# Patient Record
Sex: Female | Born: 1945 | Hispanic: No | Smoking: Former smoker
Health system: Southern US, Community
[De-identification: ages and names within clinical notes are randomized; demographics above are authoritative.]

## PROBLEM LIST (undated history)

## (undated) DIAGNOSIS — M5412 Radiculopathy, cervical region: Secondary | ICD-10-CM

## (undated) DIAGNOSIS — E785 Hyperlipidemia, unspecified: Secondary | ICD-10-CM

## (undated) DIAGNOSIS — F32A Depression, unspecified: Secondary | ICD-10-CM

## (undated) DIAGNOSIS — K259 Gastric ulcer, unspecified as acute or chronic, without hemorrhage or perforation: Secondary | ICD-10-CM

## (undated) DIAGNOSIS — F419 Anxiety disorder, unspecified: Secondary | ICD-10-CM

## (undated) DIAGNOSIS — I10 Essential (primary) hypertension: Secondary | ICD-10-CM

## (undated) DIAGNOSIS — Z8639 Personal history of other endocrine, nutritional and metabolic disease: Secondary | ICD-10-CM

## (undated) DIAGNOSIS — T8543XA Leakage of breast prosthesis and implant, initial encounter: Secondary | ICD-10-CM

## (undated) DIAGNOSIS — D509 Iron deficiency anemia, unspecified: Secondary | ICD-10-CM

## (undated) DIAGNOSIS — K635 Polyp of colon: Secondary | ICD-10-CM

## (undated) DIAGNOSIS — E876 Hypokalemia: Secondary | ICD-10-CM

## (undated) DIAGNOSIS — Z9889 Other specified postprocedural states: Secondary | ICD-10-CM

## (undated) DIAGNOSIS — M069 Rheumatoid arthritis, unspecified: Secondary | ICD-10-CM

## (undated) DIAGNOSIS — K219 Gastro-esophageal reflux disease without esophagitis: Secondary | ICD-10-CM

## (undated) DIAGNOSIS — R7303 Prediabetes: Secondary | ICD-10-CM

## (undated) DIAGNOSIS — M1990 Unspecified osteoarthritis, unspecified site: Secondary | ICD-10-CM

## (undated) DIAGNOSIS — G4700 Insomnia, unspecified: Secondary | ICD-10-CM

## (undated) DIAGNOSIS — T7840XA Allergy, unspecified, initial encounter: Secondary | ICD-10-CM

## (undated) DIAGNOSIS — M797 Fibromyalgia: Secondary | ICD-10-CM

## (undated) DIAGNOSIS — B019 Varicella without complication: Secondary | ICD-10-CM

## (undated) DIAGNOSIS — F329 Major depressive disorder, single episode, unspecified: Secondary | ICD-10-CM

## (undated) HISTORY — DX: Varicella without complication: B01.9

## (undated) HISTORY — DX: Hyperlipidemia, unspecified: E78.5

## (undated) HISTORY — PX: MASTECTOMY: SHX3

## (undated) HISTORY — DX: Depression, unspecified: F32.A

## (undated) HISTORY — PX: SMALL INTESTINE SURGERY: SHX150

## (undated) HISTORY — DX: Allergy, unspecified, initial encounter: T78.40XA

## (undated) HISTORY — DX: Hypokalemia: E87.6

## (undated) HISTORY — DX: Major depressive disorder, single episode, unspecified: F32.9

## (undated) HISTORY — DX: Unspecified osteoarthritis, unspecified site: M19.90

## (undated) HISTORY — DX: Anxiety disorder, unspecified: F41.9

## (undated) HISTORY — PX: FRACTURE SURGERY: SHX138

## (undated) HISTORY — DX: Gastric ulcer, unspecified as acute or chronic, without hemorrhage or perforation: K25.9

## (undated) HISTORY — PX: EYE SURGERY: SHX253

## (undated) HISTORY — DX: Polyp of colon: K63.5

## (undated) HISTORY — PX: AUGMENTATION MAMMAPLASTY: SUR837

## (undated) HISTORY — DX: Rheumatoid arthritis, unspecified: M06.9

## (undated) HISTORY — PX: UPPER GASTROINTESTINAL ENDOSCOPY: SHX188

## (undated) HISTORY — DX: Personal history of other endocrine, nutritional and metabolic disease: Z86.39

## (undated) HISTORY — DX: Fibromyalgia: M79.7

## (undated) MED FILL — Iron Sucrose Inj 20 MG/ML (Fe Equiv): INTRAVENOUS | Qty: 10 | Status: AC

---
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
## 1972-04-20 HISTORY — PX: ABDOMINAL HYSTERECTOMY: SHX81

## 2001-12-19 HISTORY — PX: SMALL INTESTINE SURGERY: SHX150

## 2001-12-19 HISTORY — PX: STOMACH SURGERY: SHX791

## 2010-11-22 HISTORY — PX: FOOT SURGERY: SHX648

## 2015-01-30 DIAGNOSIS — E039 Hypothyroidism, unspecified: Secondary | ICD-10-CM | POA: Diagnosis not present

## 2015-01-30 DIAGNOSIS — Z1239 Encounter for other screening for malignant neoplasm of breast: Secondary | ICD-10-CM | POA: Diagnosis not present

## 2015-01-30 DIAGNOSIS — M069 Rheumatoid arthritis, unspecified: Secondary | ICD-10-CM | POA: Diagnosis not present

## 2015-01-30 DIAGNOSIS — Z0000 Encounter for general adult medical examination without abnormal findings: Secondary | ICD-10-CM | POA: Diagnosis not present

## 2015-01-30 DIAGNOSIS — Z7189 Other specified counseling: Secondary | ICD-10-CM | POA: Diagnosis not present

## 2015-01-30 DIAGNOSIS — E785 Hyperlipidemia, unspecified: Secondary | ICD-10-CM | POA: Diagnosis not present

## 2015-01-30 DIAGNOSIS — Z23 Encounter for immunization: Secondary | ICD-10-CM | POA: Diagnosis not present

## 2015-01-30 DIAGNOSIS — Z681 Body mass index (BMI) 19 or less, adult: Secondary | ICD-10-CM | POA: Diagnosis not present

## 2015-02-01 DIAGNOSIS — H43399 Other vitreous opacities, unspecified eye: Secondary | ICD-10-CM | POA: Diagnosis not present

## 2015-02-01 DIAGNOSIS — H43819 Vitreous degeneration, unspecified eye: Secondary | ICD-10-CM | POA: Diagnosis not present

## 2015-02-01 DIAGNOSIS — H524 Presbyopia: Secondary | ICD-10-CM | POA: Diagnosis not present

## 2015-02-01 DIAGNOSIS — H5201 Hypermetropia, right eye: Secondary | ICD-10-CM | POA: Diagnosis not present

## 2015-02-01 DIAGNOSIS — H52223 Regular astigmatism, bilateral: Secondary | ICD-10-CM | POA: Diagnosis not present

## 2015-02-07 DIAGNOSIS — D649 Anemia, unspecified: Secondary | ICD-10-CM | POA: Diagnosis not present

## 2015-02-11 DIAGNOSIS — D649 Anemia, unspecified: Secondary | ICD-10-CM | POA: Diagnosis not present

## 2015-03-26 DIAGNOSIS — M069 Rheumatoid arthritis, unspecified: Secondary | ICD-10-CM | POA: Diagnosis not present

## 2015-03-26 DIAGNOSIS — M25512 Pain in left shoulder: Secondary | ICD-10-CM | POA: Diagnosis not present

## 2015-03-26 DIAGNOSIS — G8929 Other chronic pain: Secondary | ICD-10-CM | POA: Diagnosis not present

## 2015-03-26 DIAGNOSIS — D649 Anemia, unspecified: Secondary | ICD-10-CM | POA: Diagnosis not present

## 2015-04-02 DIAGNOSIS — M1990 Unspecified osteoarthritis, unspecified site: Secondary | ICD-10-CM | POA: Diagnosis not present

## 2015-04-02 DIAGNOSIS — E039 Hypothyroidism, unspecified: Secondary | ICD-10-CM | POA: Diagnosis not present

## 2015-04-02 DIAGNOSIS — R634 Abnormal weight loss: Secondary | ICD-10-CM | POA: Diagnosis not present

## 2015-04-02 DIAGNOSIS — M069 Rheumatoid arthritis, unspecified: Secondary | ICD-10-CM | POA: Diagnosis not present

## 2015-04-02 DIAGNOSIS — F419 Anxiety disorder, unspecified: Secondary | ICD-10-CM | POA: Diagnosis not present

## 2015-04-02 DIAGNOSIS — E785 Hyperlipidemia, unspecified: Secondary | ICD-10-CM | POA: Diagnosis not present

## 2015-04-02 DIAGNOSIS — R252 Cramp and spasm: Secondary | ICD-10-CM | POA: Diagnosis not present

## 2015-05-14 DIAGNOSIS — Z1211 Encounter for screening for malignant neoplasm of colon: Secondary | ICD-10-CM | POA: Diagnosis not present

## 2015-05-14 DIAGNOSIS — F419 Anxiety disorder, unspecified: Secondary | ICD-10-CM | POA: Diagnosis not present

## 2015-05-14 DIAGNOSIS — Z8639 Personal history of other endocrine, nutritional and metabolic disease: Secondary | ICD-10-CM | POA: Diagnosis not present

## 2015-05-14 DIAGNOSIS — R634 Abnormal weight loss: Secondary | ICD-10-CM | POA: Diagnosis not present

## 2015-05-14 LAB — LIPID PANEL
Cholesterol: 165 mg/dL (ref 0–200)
HDL: 68 mg/dL (ref 35–70)
LDL Cholesterol: 92 mg/dL
Triglycerides: 162 mg/dL — AB (ref 40–160)

## 2015-05-14 LAB — CBC AND DIFFERENTIAL
HCT: 36 % (ref 36–46)
HEMOGLOBIN: 11.3 g/dL — AB (ref 12.0–16.0)
Platelets: 340 10*3/uL (ref 150–399)
WBC: 9.7 10^3/mL

## 2015-05-14 LAB — BASIC METABOLIC PANEL
BUN: 16 mg/dL (ref 4–21)
CREATININE: 0.4 mg/dL — AB (ref 0.5–1.1)
GLUCOSE: 72 mg/dL
POTASSIUM: 4.1 mmol/L (ref 3.4–5.3)
SODIUM: 137 mmol/L (ref 137–147)

## 2015-05-14 LAB — TSH: TSH: 2.52 u[IU]/mL (ref 0.41–5.90)

## 2015-05-24 DIAGNOSIS — D500 Iron deficiency anemia secondary to blood loss (chronic): Secondary | ICD-10-CM | POA: Diagnosis not present

## 2015-05-24 DIAGNOSIS — R197 Diarrhea, unspecified: Secondary | ICD-10-CM | POA: Diagnosis not present

## 2015-06-12 DIAGNOSIS — F419 Anxiety disorder, unspecified: Secondary | ICD-10-CM | POA: Diagnosis not present

## 2015-06-12 DIAGNOSIS — E785 Hyperlipidemia, unspecified: Secondary | ICD-10-CM | POA: Insufficient documentation

## 2015-06-12 DIAGNOSIS — R634 Abnormal weight loss: Secondary | ICD-10-CM | POA: Diagnosis not present

## 2015-06-12 DIAGNOSIS — E876 Hypokalemia: Secondary | ICD-10-CM | POA: Diagnosis not present

## 2015-06-12 DIAGNOSIS — M797 Fibromyalgia: Secondary | ICD-10-CM | POA: Insufficient documentation

## 2015-06-20 ENCOUNTER — Ambulatory Visit: Payer: Self-pay | Admitting: Family Medicine

## 2015-06-20 DIAGNOSIS — K2900 Acute gastritis without bleeding: Secondary | ICD-10-CM | POA: Diagnosis not present

## 2015-06-20 DIAGNOSIS — Z87891 Personal history of nicotine dependence: Secondary | ICD-10-CM | POA: Diagnosis not present

## 2015-06-20 DIAGNOSIS — R197 Diarrhea, unspecified: Secondary | ICD-10-CM | POA: Diagnosis not present

## 2015-06-20 DIAGNOSIS — D125 Benign neoplasm of sigmoid colon: Secondary | ICD-10-CM | POA: Diagnosis not present

## 2015-06-20 DIAGNOSIS — K6289 Other specified diseases of anus and rectum: Secondary | ICD-10-CM | POA: Diagnosis not present

## 2015-06-20 DIAGNOSIS — D649 Anemia, unspecified: Secondary | ICD-10-CM | POA: Diagnosis not present

## 2015-06-20 DIAGNOSIS — R150 Incomplete defecation: Secondary | ICD-10-CM | POA: Diagnosis not present

## 2015-06-20 DIAGNOSIS — K5730 Diverticulosis of large intestine without perforation or abscess without bleeding: Secondary | ICD-10-CM | POA: Diagnosis not present

## 2015-06-20 DIAGNOSIS — D500 Iron deficiency anemia secondary to blood loss (chronic): Secondary | ICD-10-CM | POA: Diagnosis not present

## 2015-06-20 LAB — HM COLONOSCOPY

## 2015-07-01 ENCOUNTER — Encounter: Payer: Self-pay | Admitting: Family Medicine

## 2015-07-01 ENCOUNTER — Ambulatory Visit (INDEPENDENT_AMBULATORY_CARE_PROVIDER_SITE_OTHER): Payer: Medicare Other | Admitting: Family Medicine

## 2015-07-01 VITALS — BP 112/70 | HR 68 | Temp 98.0°F | Ht 63.75 in | Wt 129.1 lb

## 2015-07-01 DIAGNOSIS — E2839 Other primary ovarian failure: Secondary | ICD-10-CM

## 2015-07-01 DIAGNOSIS — F419 Anxiety disorder, unspecified: Secondary | ICD-10-CM

## 2015-07-01 DIAGNOSIS — M898X1 Other specified disorders of bone, shoulder: Secondary | ICD-10-CM | POA: Insufficient documentation

## 2015-07-01 DIAGNOSIS — Z8639 Personal history of other endocrine, nutritional and metabolic disease: Secondary | ICD-10-CM | POA: Insufficient documentation

## 2015-07-01 DIAGNOSIS — F418 Other specified anxiety disorders: Secondary | ICD-10-CM | POA: Diagnosis not present

## 2015-07-01 DIAGNOSIS — E785 Hyperlipidemia, unspecified: Secondary | ICD-10-CM

## 2015-07-01 DIAGNOSIS — M069 Rheumatoid arthritis, unspecified: Secondary | ICD-10-CM | POA: Diagnosis not present

## 2015-07-01 DIAGNOSIS — D509 Iron deficiency anemia, unspecified: Secondary | ICD-10-CM | POA: Insufficient documentation

## 2015-07-01 DIAGNOSIS — F32A Depression, unspecified: Secondary | ICD-10-CM | POA: Insufficient documentation

## 2015-07-01 DIAGNOSIS — G4700 Insomnia, unspecified: Secondary | ICD-10-CM | POA: Diagnosis not present

## 2015-07-01 DIAGNOSIS — F329 Major depressive disorder, single episode, unspecified: Secondary | ICD-10-CM | POA: Insufficient documentation

## 2015-07-01 MED ORDER — TRAMADOL HCL 50 MG PO TABS: 50.0000 mg | ORAL_TABLET | Freq: Three times a day (TID) | ORAL | Status: DC | PRN

## 2015-07-01 NOTE — Assessment & Plan Note (Signed)
Discussed referral to rheumatology and patient and daughter would like to hold off at this time. Advised to discontinue meloxicam given her history of gastric ulcerations. Starting on tramadol.

## 2015-07-01 NOTE — Assessment & Plan Note (Signed)
Well-controlled. ASCVD risk score of 5.9%. No pharmacotherapy currently.

## 2015-07-01 NOTE — Patient Instructions (Signed)
It was nice to see you today.  Stop the meloxicam. Use the tramadol as needed.  We will call with your bone density and PT appts.  Follow up:  3 months.  Take care  Dr. Lacinda Axon

## 2015-07-01 NOTE — Progress Notes (Signed)
Subjective:  Patient ID: Audrey Hamilton, female    DOB: 02/18/1946  Age: 70 y.o. MRN: [id number]  CC: Establish care  HPI Audrey Hamilton is a 70 y.o. female presents to the clinic today to establish care. Issues are outline below.  Rheumatoid arthritis/osteoarthritis  Patient is currently taking meloxicam daily for pain.  She is not on any other rheumatologic drugs.  She reports she has significant pain particularly of her right wrist.  Pain is exacerbated by range of motion/activity.  Anxiety/depression  Patient has a long-standing history of anxiety depression which developed after a grieving process asked the death of her husband.  She states that she is currently feeling well.  She has had improvement in her mood since moving to the area.  She is currently taking Lexapro and Ativan (PRN).  Scapula pain (left)  Long-standing.  Started after an injury while lifting her husband while he was sick.  She reports neck and pain of her left scapula towards the midline.  She has been using Lidoderm patches with some relief.  She's had no sensory response to NSAIDs.  Exacerbated by activity/range motion.  HLD  Well controlled.   Not treated currently.  Anemia  Patient has a recent history of microcytic anemia with a hemoglobin of 11.3  She has had a recent EGD as well as colonoscopy.  She is requesting the results of these. Results are not available to me at this time.  Stable at this time.  PMH, Surgical Hx, Family Hx, Social History reviewed and updated as below.  Past Medical History  Diagnosis Date  . Arthritis   . Chicken pox   . Depression   . Allergy   . Multiple gastric ulcers   . Hyperlipidemia   . Colon polyps   . Hypokalemia   . Fibromyalgia   . Rheumatoid arthritis ([place])   . History of hypothyroidism   . Anxiety    Past Surgical History  Procedure Laterality Date  . Tonsillectomy and adenoidectomy  1966  . Abdominal  hysterectomy  1974  . Foot surgery Left 11/22/10  . Stomach surgery  12/2001  . Small intestine surgery  12/2001  . Eye surgery    . Upper gastrointestinal endoscopy     Family History  Problem Relation Age of Onset  . Arthritis Mother   . Sudden death Mother     after birth of pt who was 63 days old.  . Arthritis Maternal Grandmother   . Hyperlipidemia Maternal Grandfather   . Diabetes Mellitus II Father   . Liver disease Father   . Cancer Maternal Grandmother   . Depression Mother    Social History  Substance Use Topics  . Smoking status: Former Smoker    Quit date: 05/20/1983  . Smokeless tobacco: Never Used  . Alcohol Use: No   Review of Systems  Musculoskeletal: Positive for myalgias and joint swelling.  Neurological:       Memory problems.   All other systems reviewed and are negative.  Objective:   Today's Vitals: BP 112/70 mmHg  Pulse 68  Temp(Src) 98 F (36.7 C) (Oral)  Ht 5' 3.75" (1.619 m)  Wt 129 lb 2 oz (58.571 kg)  BMI 22.35 kg/m2  SpO2 85%  Physical Exam  Constitutional: She is oriented to person, place, and time.  Frail appearing elderly female in no acute distress.  HENT:  Head: Normocephalic and atraumatic.  Mouth/Throat: Oropharynx is clear and moist.  Eyes: Conjunctivae are normal. No scleral icterus.  Neck: Neck supple. No thyromegaly present.  Cardiovascular: Normal rate and regular rhythm.   2/6 systolic murmur best heard at the right second intercostal space.  Pulmonary/Chest: Effort normal.  Expiratory wheezing noted.  Abdominal: Soft. She exhibits no distension. There is no tenderness. There is no rebound and no guarding.  Musculoskeletal:  Decreased range of motion and tenderness to palpation of the right wrist.  Neurological: She is alert and oriented to person, place, and time.  Skin: Skin is warm and dry. No rash noted. No erythema.  Psychiatric: She has a normal mood and affect.  Vitals reviewed.  Assessment & Plan:   Problem  List Items Addressed This Visit    Anxiety and depression    Well-controlled. Continue Lexapro and Ativan. I did have a discussion today with the patient as well as her daughter about wanting to taper/discontinue the Ativan given her age.      Insomnia   Rheumatoid arthritis ([place])    Discussed referral to rheumatology and patient and daughter would like to hold off at this time. Advised to discontinue meloxicam given her history of gastric ulcerations. Starting on tramadol.      Relevant Medications   traMADol (ULTRAM) 50 MG tablet   HLD (hyperlipidemia)    Well-controlled. ASCVD risk score of 5.9%. No pharmacotherapy currently.      Microcytic anemia    Unclear etiology but I suspect that this is from peptic ulcer disease. Awaiting results from EGD/colonoscopy.      Pain of left scapula - Primary    Long-standing/chronic problem. This is musculoskeletal in origin. Referring to PT.      Relevant Orders   Ambulatory referral to Physical Therapy    Other Visit Diagnoses    Estrogen deficiency        Relevant Orders    DG Bone Density      Outpatient Encounter Prescriptions as of 07/01/2015  Medication Sig  . Bee Pollen 580 MG CAPS Take by mouth.  . escitalopram (LEXAPRO) 10 MG tablet Take by mouth.  . fluticasone (FLONASE) 50 MCG/ACT nasal spray Place into the nose at bedtime.   . lidocaine (LIDODERM) 5 % Apply 1 patch topically every other day.   Marland Kitchen LORazepam (ATIVAN) 1 MG tablet Take by mouth at bedtime.   . montelukast (SINGULAIR) 10 MG tablet Take by mouth at bedtime.   . sucralfate (CARAFATE) 1 g tablet TAKE 1 TABLET BY MOUTH THREE TIMES DAILY BEFORE MEALS.  . traMADol (ULTRAM) 50 MG tablet Take 1 tablet (50 mg total) by mouth every 8 (eight) hours as needed.  . [DISCONTINUED] meloxicam (MOBIC) 7.5 MG tablet Take 7.5 mg by mouth 2 (two) times daily.   No facility-administered encounter medications on file as of 07/01/2015.    Follow-up: Return in about 3  months (around 10/01/2015) for Follow up Chronic medical issues.  [place]

## 2015-07-01 NOTE — Assessment & Plan Note (Signed)
Long-standing/chronic problem. This is musculoskeletal in origin. Referring to PT.

## 2015-07-01 NOTE — Progress Notes (Signed)
Pre visit review using our clinic review tool, if applicable. No additional management support is needed unless otherwise documented below in the visit note. 

## 2015-07-01 NOTE — Assessment & Plan Note (Signed)
Unclear etiology but I suspect that this is from peptic ulcer disease. Awaiting results from EGD/colonoscopy.

## 2015-07-01 NOTE — Assessment & Plan Note (Addendum)
Well-controlled. Continue Lexapro and Ativan. I did have a discussion today with the patient as well as her daughter about wanting to taper/discontinue the Ativan given her age.

## 2015-07-12 ENCOUNTER — Other Ambulatory Visit: Payer: Self-pay | Admitting: Family Medicine

## 2015-07-12 ENCOUNTER — Telehealth: Payer: Self-pay | Admitting: Family Medicine

## 2015-07-12 DIAGNOSIS — M069 Rheumatoid arthritis, unspecified: Secondary | ICD-10-CM

## 2015-07-12 NOTE — Telephone Encounter (Signed)
Pt daughter called needing a referral to [facility] Rheumatology. She states that when she was in she was given the information to call to schedule but when she did they informed her that they need a referral from PCP. Please advise.

## 2015-07-12 NOTE — Telephone Encounter (Signed)
I placed referral.  Thanks  JC DO

## 2015-07-22 ENCOUNTER — Encounter: Payer: Self-pay | Admitting: Physical Therapy

## 2015-07-22 ENCOUNTER — Ambulatory Visit: Payer: Medicare Other | Attending: Family Medicine | Admitting: Physical Therapy

## 2015-07-22 DIAGNOSIS — R293 Abnormal posture: Secondary | ICD-10-CM | POA: Diagnosis not present

## 2015-07-22 DIAGNOSIS — M6281 Muscle weakness (generalized): Secondary | ICD-10-CM

## 2015-07-22 DIAGNOSIS — M898X1 Other specified disorders of bone, shoulder: Secondary | ICD-10-CM | POA: Diagnosis not present

## 2015-07-22 NOTE — Therapy (Signed)
[place] [facility]IN [facility] [address] [place], [place], [address] Phone: [phone]   Fax:  [phone]  Physical Therapy Evaluation  Patient Details  Name: Audrey Hamilton MRN: [id number] Date of Birth: [birth year] Referring Provider: Jene Every MD  Encounter Date: 07/22/2015      PT End of Session - 07/22/15 1024    Visit Number 1   Number of Visits 13   Date for PT Re-Evaluation 09/02/15   Authorization Type Gcode 1   Authorization Time Period 10   PT Start Time 0935   PT Stop Time 1015   PT Time Calculation (min) 40 min   Activity Tolerance Patient tolerated treatment well;No increased pain   Behavior During Therapy [facility] for tasks assessed/performed      Past Medical History  Diagnosis Date  . Chicken pox   . Depression   . Allergy   . Multiple gastric ulcers   . Hyperlipidemia   . Colon polyps   . Hypokalemia   . Fibromyalgia   . History of hypothyroidism   . Arthritis   . Rheumatoid arthritis ([place]) Since 1995    currently in a flare up  . Anxiety     controlled    Past Surgical History  Procedure Laterality Date  . Tonsillectomy and adenoidectomy  1966  . Abdominal hysterectomy  1974  . Foot surgery Left 11/22/10  . Stomach surgery  12/2001  . Small intestine surgery  12/2001  . Eye surgery    . Upper gastrointestinal endoscopy      There were no vitals filed for this visit.  Visit Diagnosis:  Pain in scapula - Plan: PT plan of care cert/re-cert  Muscle weakness (generalized) - Plan: PT plan of care cert/re-cert  Abnormal posture - Plan: PT plan of care cert/re-cert      Subjective Assessment - 07/22/15 0940    Subjective 71 yo Female reports increased left scapula pain over last year; She reports trying prednisone which did help a little bit; She reports when she finished the medication she reports that her pain returned. Patient reports trying a TENs unit with some good results; She reports that her pain  started when she would help try to lift her husband. She reports that her husband would fall a lot and she would have to help him; He passed away last year; She reports that her pain is aggravated with LUE movement/lifting;    Pertinent History Personal factors affecting rehab: RA, Fibromyalgia, Chronic pain,    How long can you sit comfortably? 15 min   How long can you stand comfortably? 30 min;    How long can you walk comfortably? does walk a lot, not using cane; not able to walk a mile, but has been walking better;    Diagnostic tests Had x-rays; negative for fracture;    Patient Stated Goals "Use my left arm without pain, be able to move around like I used to"   Currently in Pain? Yes   Pain Score 5    Pain Location Scapula   Pain Orientation Left   Pain Descriptors / Indicators Throbbing   Pain Type Chronic pain   Pain Onset More than a month ago   Pain Frequency Intermittent   Aggravating Factors  using LUE, lifting;    Pain Relieving Factors TENs unit (uses about every hour, 10-15 min)   Effect of Pain on Daily Activities decreased mobility;  [facility] PT Assessment - 07/22/15 0001    Assessment   Medical Diagnosis Left scapula pain   Referring Provider Jene Every MD   Onset Date/Surgical Date 04/20/14   Hand Dominance Right   Next MD Visit August 2017   Prior Therapy Denies any PT in the past;    Precautions   Precautions Fall   Restrictions   Weight Bearing Restrictions No   Balance Screen   Has the patient fallen in the past 6 months No   Has the patient had a decrease in activity level because of a fear of falling?  Yes   Is the patient reluctant to leave their home because of a fear of falling?  No   Home Environment   Additional Comments Lives in a single story home, no stairs to enter; Lives alone; Mod I with all ADLs; She does all the cooking and light housework;    Prior Function   Level of Independence Independent;Independent with gait;Independent  with transfers;Independent with homemaking with ambulation   Vocation Retired   Leisure likes to do crafts, knit; going to sign up for [employer];    Cognition   Overall Cognitive Status Within Functional Limits for tasks assessed   Observation/Other Assessments   Skin Integrity grossly intact   Sensation   Light Touch Appears Intact   Additional Comments no numbness/tingling throughout BUE   Coordination   Gross Motor Movements are Fluid and Coordinated Yes   Fine Motor Movements are Fluid and Coordinated Yes   Posture/Postural Control   Posture Comments Patients sits unsupported with moderate forward rounded shoulder, increased left scapular winging noted, increased thoracic kyphosis; Patient able to self correct partially without increase in pain;    AROM   Overall AROM Comments BUE AROM is WFL; however decreased left scapular abduction noted as compared to RUE; increased pain reported with shoulder abduction/flexion;   Strength   Overall Strength Comments BUE grossly 4-/5, with exception: LUE: middle trap, rhomboids, 3/5, shoulder abduction 3/5   Palpation   Spinal mobility hypmobility noted throughout thoracic spine, increased pain with PA mobs at T4, T5, T6, T7 with pain radiating to left scapula;    Palpation comment pain along T4, T5, T6, T7 with PA mobs; also has tightness throughout left rhomboids/middle trap;    Transfers   Comments able to transfer independently;    Ambulation/Gait   Gait Comments ambulates independently, slower gait speed, narrow base of support, good foot clearance;    High Level Balance   High Level Balance Comments static standing balance is good, dynamic standing balance is fair;       TREATMENT: Initiated HEP: Standing with red tband in BUE: Shoulder rows x10 reps with cues for increased scapular retraction; BUE shoulder extension with min VCs to avoid shoulder elevation; Posterior shoulder rolls x10 reps;                      PT Education - 07/22/15 1024    Education provided Yes   Education Details HEP initiated, findings   Person(s) Educated Patient   Methods Explanation;Demonstration;Verbal cues;Handout   Comprehension Verbalized understanding;Returned demonstration;Verbal cues required             PT Long Term Goals - 07/22/15 1029    PT LONG TERM GOAL #1   Title Patient will be independent in home exercise program to improve strength/mobility for better functional independence with ADLs. by 09/02/15   Time 6   Period Weeks  Status New   PT LONG TERM GOAL #2   Title  Patient will be able to perform household work/ chores without increase in symptoms. by 09/02/15   Time 6   Period Weeks   Status New   PT LONG TERM GOAL #3   Title Patient will increase BUE gross strength to at least 4/5 without pain during LUE MMT for increased tolerance with ADLs including light lifting by 09/02/15   Time 6   Period Weeks   Status New   PT LONG TERM GOAL #4   Title Patient will improve AROM UE so they are able to perform overhead ADL's such as reaching into cabinets without pain by 09/02/15   Time 6   Period Weeks   Status New               Plan - August 05, 2015 1025    Clinical Impression Statement 70 yo Female reports increased left scapula pain over last year. She reports having increased pain after trying to help her husband off the floor. He passed away last year but she had to provide a lot of physical support due to his imbalance. She exhibits decreased scapula-humeral rhthym with decreased scapular abduction during overhead reach; She also exhibits increased left scapular winging as compared to RUE; Patient exhibits increased weakness in BUE but particularly in left scapular paraspinals. She did exhibit increased pain along spinal mobs. Patient has a history of RA and fibromyalgia. She has not had a recent bone scan but is scheduled for one. Given her advanced age, spinal mobs will be withheld until bone  scan complete over concern for osteoporosis. Patient would benefit from skilled PT Intervention to improve scapular strength, improve flexibility for less pain with ADLs.    Pt will benefit from skilled therapeutic intervention in order to improve on the following deficits Decreased endurance;Hypomobility;Decreased activity tolerance;Decreased strength;Impaired UE functional use;Pain;Increased muscle spasms;Decreased range of motion;Improper body mechanics;Postural dysfunction   Rehab Potential Fair   Clinical Impairments Affecting Rehab Potential positive: motivated, has good support through daughter; negative: chronic condition, co-morbidities; Patient's clinical presentation is stable as her pain is localized to just left scapula and doesn't vary much;    PT Frequency 2x / week   PT Duration 6 weeks   PT Treatment/Interventions ADLs/Self Care Home Management;Cryotherapy;Electrical Stimulation;Moist Heat;Therapeutic exercise;Therapeutic activities;Functional mobility training;Ultrasound;Neuromuscular re-education;Patient/family education;Manual techniques;Taping;Energy conservation;Dry needling   PT Next Visit Plan advance HEP, manual therapy   PT Home Exercise Plan initiated, see patient instructions;    Consulted and Agree with Plan of Care Patient          G-Codes - 08-05-2015 1032    Functional Assessment Tool Used strength, ROM, clinical judgement   Functional Limitation Changing and maintaining body position   Changing and Maintaining Body Position Current Status [phone]) At least 40 percent but less than 60 percent impaired, limited or restricted   Changing and Maintaining Body Position Goal Status [id number]) At least 1 percent but less than 20 percent impaired, limited or restricted       Problem List Patient Active Problem List   Diagnosis Date Noted  . History of hypothyroidism 07/01/2015  . Anxiety and depression 07/01/2015  . Insomnia 07/01/2015  . Rheumatoid arthritis ([place])  07/01/2015  . Microcytic anemia 07/01/2015  . Pain of left scapula 07/01/2015  . HLD (hyperlipidemia) 06/12/2015  . Fibromyalgia 06/12/2015    Audrey Hamilton PT, DPT 2015-08-05, 10:33 AM  [place] [facility] SERVICES [address]  [place], [place], [address] Phone: [phone]   Fax:  [phone]  Name: Audrey Hamilton MRN: [id number] Date of Birth: 26-Jul-1945

## 2015-07-22 NOTE — Patient Instructions (Signed)
  Shoulder Retraction   Tie band around door knob (sitting or standing, holding band in both hands) Facing chest height anchor, grasp ends of band and pull hands to chest, squeezing shoulder blades together. Hold _3-5seconds. Repeat _10 times. Do _2_ sessions per day. Safety Note: Be sure anchor is secure.  Copyright  VHI. All rights reserved.  Strengthening: Resisted Extension   Hold band in both hands, Pull arm back, elbow straight, squeezing shoulder blades, Repeat _10___ times per set. Do _2___ sets per session. Do __1__ sessions per day.  [email or web address].[email or web address]     Roll   Inhale and bring shoulders up, back, then exhale and relax shoulders down. Repeat _10__ times. Do _2-3__ times per day.  Copyright  VHI. All rights reserved.

## 2015-07-23 ENCOUNTER — Ambulatory Visit: Payer: Medicare Other

## 2015-07-24 ENCOUNTER — Ambulatory Visit: Payer: Medicare Other | Admitting: Physical Therapy

## 2015-07-29 ENCOUNTER — Ambulatory Visit: Payer: Medicare Other | Admitting: Physical Therapy

## 2015-07-30 ENCOUNTER — Ambulatory Visit
Admission: RE | Admit: 2015-07-30 | Discharge: 2015-07-30 | Disposition: A | Discharge status: Home / Self Care | Payer: Medicare Other | Source: Ambulatory Visit | Attending: Family Medicine | Admitting: Family Medicine

## 2015-07-30 DIAGNOSIS — Z13820 Encounter for screening for osteoporosis: Secondary | ICD-10-CM | POA: Diagnosis not present

## 2015-07-30 DIAGNOSIS — E2839 Other primary ovarian failure: Secondary | ICD-10-CM

## 2015-08-01 ENCOUNTER — Encounter: Payer: Medicare Other | Admitting: Physical Therapy

## 2015-08-05 ENCOUNTER — Ambulatory Visit: Payer: Medicare Other | Admitting: Physical Therapy

## 2015-08-05 ENCOUNTER — Encounter: Payer: Self-pay | Admitting: Physical Therapy

## 2015-08-05 DIAGNOSIS — M6281 Muscle weakness (generalized): Secondary | ICD-10-CM

## 2015-08-05 DIAGNOSIS — R293 Abnormal posture: Secondary | ICD-10-CM

## 2015-08-05 DIAGNOSIS — M898X1 Other specified disorders of bone, shoulder: Secondary | ICD-10-CM | POA: Diagnosis not present

## 2015-08-05 NOTE — Therapy (Signed)
[place] MAIN [facility] SERVICES [address] [place], [place], [address] Phone: [phone]   Fax:  [phone]  Physical Therapy Treatment  Patient Details  Name: Audrey Hamilton MRN: [id number] Date of Birth: 11/15/1945 Referring Provider: Jene Every MD  Encounter Date: 08/05/2015      PT End of Session - 08/05/15 0939    Visit Number 2   Number of Visits 13   Date for PT Re-Evaluation 09/02/15   Authorization Type Gcode 2   Authorization Time Period 10   PT Start Time 0932   PT Stop Time 1015   PT Time Calculation (min) 43 min   Activity Tolerance Patient tolerated treatment well;No increased pain   Behavior During Therapy [facility] for tasks assessed/performed      Past Medical History  Diagnosis Date  . Chicken pox   . Depression   . Allergy   . Multiple gastric ulcers   . Hyperlipidemia   . Colon polyps   . Hypokalemia   . Fibromyalgia   . History of hypothyroidism   . Arthritis   . Rheumatoid arthritis ([place]) Since 1995    currently in a flare up  . Anxiety     controlled    Past Surgical History  Procedure Laterality Date  . Tonsillectomy and adenoidectomy  1966  . Abdominal hysterectomy  1974  . Foot surgery Left 11/22/10  . Stomach surgery  12/2001  . Small intestine surgery  12/2001  . Eye surgery    . Upper gastrointestinal endoscopy      There were no vitals filed for this visit.      Subjective Assessment - 08/05/15 0938    Subjective Patient reports feeling a "throbbing pain" along her left shoulder blade from doing the exercise; She denies any sharp pain; Reports compliance with HEP;    Pertinent History Personal factors affecting rehab: RA, Fibromyalgia, Chronic pain,    How long can you sit comfortably? 15 min   How long can you stand comfortably? 30 min;    How long can you walk comfortably? does walk a lot, not using cane; not able to walk a mile, but has been walking better;    Diagnostic tests Had  x-rays; negative for fracture;    Patient Stated Goals "Use my left arm without pain, be able to move around like I used to"   Currently in Pain? Yes   Pain Score 3    Pain Location Scapula   Pain Orientation Left   Pain Descriptors / Indicators Throbbing   Pain Type Chronic pain   Pain Onset More than a month ago        TREATMENT: Warm up on UBE, BUE level 2 x2 min backwards only (unbilled);  HOIST mid row plate #1, [id number] with cues to increase scapular retraction and to relax shoulder elevation; HOIST lat pulldown plate #1 [id number] with cues for correct posture and technique;  Standing facing wall: Serratus punch x15 with min Vcs to increase elbow extension for better scapular stretch/protraction for increased strengthening;  BUE "V" with arm lift off of wall x10 reps;  Doorway stretch 15 sec hold x2 with cues to avoid leaning forward into pain;  Red tband shoulder horizontal abduction x10 with cues to avoid shoulder elevation and to keep elbows straight for increased scapular strengthening; Red tband BUE alternate diagonals x5 each direction;   PT advanced HEP- See patient instructions;  Patient required min-moderate verbal/tactile cues for correct exercise technique with all  exercise;  Finished with interferential TENs, to left scapular paraspinals at tolerated intensity x15 min concurrent with moist heat; Patient reports no pain after treatment session;                               PT Education - 08/05/15 0939    Education provided Yes   Education Details postural strengthening, HEP   Person(s) Educated Patient   Methods Explanation;Verbal cues   Comprehension Verbalized understanding;Returned demonstration;Verbal cues required             PT Long Term Goals - 08/05/15 1011    PT LONG TERM GOAL #1   Title Patient will be independent in home exercise program to improve strength/mobility for better functional independence with ADLs. by 09/02/15    Time 6   Period Weeks   Status On-going   PT LONG TERM GOAL #2   Title  Patient will be able to perform household work/ chores without increase in symptoms. by 09/02/15   Time 6   Period Weeks   Status On-going   PT LONG TERM GOAL #3   Title Patient will increase BUE gross strength to at least 4/5 without pain during LUE MMT for increased tolerance with ADLs including light lifting by 09/02/15   Time 6   Period Weeks   Status On-going   PT LONG TERM GOAL #4   Title Patient will improve AROM UE so they are able to perform overhead ADL's such as reaching into cabinets without pain by 09/02/15   Time 6   Period Weeks   Status On-going               Plan - 08/05/15 1008    Clinical Impression Statement Instructed patient in advanced scapular retraction, postural strengthening exercise. Patient required min-mod VCs for correct exercise technique. She reports increased ache in left shoulder blade with advanced exercise. Finished with TENs concurrent with moist heat. Patient responded well with no pain after treatment session; Patient would benefit from additional skilled intervention to improve scapular strength, improve posture and reduce pain;    Rehab Potential Fair   Clinical Impairments Affecting Rehab Potential positive: motivated, has good support through daughter; negative: chronic condition, co-morbidities; Patient's clinical presentation is stable as her pain is localized to just left scapula and doesn't vary much;    PT Frequency 2x / week   PT Duration 4 weeks   PT Treatment/Interventions ADLs/Self Care Home Management;Cryotherapy;Electrical Stimulation;Moist Heat;Therapeutic exercise;Therapeutic activities;Functional mobility training;Ultrasound;Neuromuscular re-education;Patient/family education;Manual techniques;Taping;Energy conservation;Dry needling   PT Next Visit Plan advance HEP, manual therapy   PT Home Exercise Plan advanced., see patient instructions;     Consulted and Agree with Plan of Care Patient      Patient will benefit from skilled therapeutic intervention in order to improve the following deficits and impairments:  Decreased endurance, Hypomobility, Decreased activity tolerance, Decreased strength, Impaired UE functional use, Pain, Increased muscle spasms, Decreased range of motion, Improper body mechanics, Postural dysfunction  Visit Diagnosis: Muscle weakness (generalized) - Plan: PT plan of care cert/re-cert  Abnormal posture - Plan: PT plan of care cert/re-cert     Problem List Patient Active Problem List   Diagnosis Date Noted  . History of hypothyroidism 07/01/2015  . Anxiety and depression 07/01/2015  . Insomnia 07/01/2015  . Rheumatoid arthritis ([place]) 07/01/2015  . Microcytic anemia 07/01/2015  . Pain of left scapula 07/01/2015  . HLD (hyperlipidemia) 06/12/2015  .  Fibromyalgia 06/12/2015    Averi Kilty PT, DPT 08/05/2015, 10:12 AM  [place] MAIN [facility] SERVICES [address] [place], [place], [address] Phone: [phone]   Fax:  [phone]  Name: Audrey Hamilton MRN: [id number] Date of Birth: 10/17/1945

## 2015-08-05 NOTE — Patient Instructions (Addendum)
  SHOULDER: Diagonal - Up and Away (Band)   Hold band in both hands, Keeping arms out in front of you with elbows straight, move one hand up and out and the other down and out into alternate diagonals. Alternate each direction, squeezing shoulder blades. Hold _2__ seconds. Use ________ band. _10 reps per set, _1_ sets per day, _1_ days per week  .   Copyright  VHI. All rights reserved.  Reverse Fly / Shoulder Retraction  Holding band in both hands, Extend both arms in front of body at shoulder height, palms down, holding band. Move arms out to sides, squeeze shoulder blades together. Repeat _10__ times. Do _2__ sessions per day.   Wall Shoulder Press-Out    With palms flat on wall, shoulder-width apart, and elbows straight, try to push arms forward, rounding upper back, then relax.  Repeat _15___ times or . Do __2__ sessions per day.  http://cc.exer.us/56   Copyright  VHI. All rights reserved.  Scapular Retraction: Elevation (Standing)    Slide arms overhead into "V" position, then lift one arm off the wall and then the other, squeezing shoulder blade as you pull off the wall, then relax back down.  Repeat 10 times each side; 2x a day if possible.  http://orth.exer.us/953   Copyright  VHI. All rights reserved.  CHEST: Doorway, Bilateral - Standing    Standing in doorway, place hands on wall with elbows bent at shoulder height. Lean forward. Hold _15__ seconds. _2__ reps per set, _2__ sets per day, __5_ days per week  Copyright  VHI. All rights reserved.

## 2015-08-07 ENCOUNTER — Encounter: Payer: Medicare Other | Admitting: Physical Therapy

## 2015-08-12 ENCOUNTER — Ambulatory Visit: Payer: Medicare Other | Admitting: Physical Therapy

## 2015-08-14 ENCOUNTER — Encounter: Payer: Medicare Other | Admitting: Physical Therapy

## 2015-08-23 ENCOUNTER — Ambulatory Visit: Payer: Medicare Other | Admitting: Physical Therapy

## 2015-08-26 ENCOUNTER — Encounter: Payer: Self-pay | Admitting: Physical Therapy

## 2015-08-26 ENCOUNTER — Ambulatory Visit: Payer: Medicare Other | Attending: Family Medicine | Admitting: Physical Therapy

## 2015-08-26 DIAGNOSIS — M6281 Muscle weakness (generalized): Secondary | ICD-10-CM

## 2015-08-26 DIAGNOSIS — R293 Abnormal posture: Secondary | ICD-10-CM | POA: Diagnosis not present

## 2015-08-26 NOTE — Therapy (Signed)
[place] MAIN [facility] SERVICES [address] [place], [place], [address] Phone: [phone]   Fax:  [phone]  Physical Therapy Treatment  Patient Details  Name: Audrey Hamilton MRN: [id number] Date of Birth: [birth year] Referring Provider: Jene Every MD  Encounter Date: 08/26/2015      PT End of Session - 08/26/15 1249    Visit Number 3   Number of Visits 13   Date for PT Re-Evaluation 09/02/15   Authorization Type Gcode 3   Authorization Time Period 10   PT Start Time [id number]   PT Stop Time 1102   PT Time Calculation (min) 19 min   Activity Tolerance Patient tolerated treatment well;No increased pain   Behavior During Therapy [facility] for tasks assessed/performed      Past Medical History  Diagnosis Date  . Chicken pox   . Depression   . Allergy   . Multiple gastric ulcers   . Hyperlipidemia   . Colon polyps   . Hypokalemia   . Fibromyalgia   . History of hypothyroidism   . Arthritis   . Rheumatoid arthritis ([place]) Since 1995    currently in a flare up  . Anxiety     controlled    Past Surgical History  Procedure Laterality Date  . Tonsillectomy and adenoidectomy  1966  . Abdominal hysterectomy  1974  . Foot surgery Left 11/22/10  . Stomach surgery  12/2001  . Small intestine surgery  12/2001  . Eye surgery    . Upper gastrointestinal endoscopy      There were no vitals filed for this visit.      Subjective Assessment - 08/26/15 1247    Subjective Patient reports, "I'm sorry I didn't check in with the front desk. There wasn't anyone there and that was my fault." Patient reports that she feels that her shoulder is getting some better   Pertinent History Personal factors affecting rehab: RA, Fibromyalgia, Chronic pain,    How long can you sit comfortably? 15 min   How long can you stand comfortably? 30 min;    How long can you walk comfortably? does walk a lot, not using cane; not able to walk a mile, but has been walking  better;    Diagnostic tests Had x-rays; negative for fracture;    Patient Stated Goals "Use my left arm without pain, be able to move around like I used to"   Currently in Pain? Yes   Pain Score 2    Pain Location Scapula   Pain Orientation Left   Pain Descriptors / Indicators Aching;Sore   Pain Type Chronic pain   Pain Onset More than a month ago        TREATMENT: Scapular retraction 3 sec hold x10; Scapular retraction with red tband 3 sec hold x5; LUE shoulder ER red tband x10 with min VCs  Facing wall: Scapular protraction x10 with cues to avoid elbow flexion for better scapular movement; "V" with UE lift off wall red tband 2x5 with min VCs to increase scapular retraction with lifting arm off wall;  Red tband around upper back, scapular protraction x5;  Patient required min-moderate verbal/tactile cues for correct exercise technique. Advanced HEP see patient instructions;                       PT Education - 08/26/15 1249    Education provided Yes   Education Details HEP advanced, postural strengthening;    Person(s) Educated Patient  Methods Explanation;Verbal cues   Comprehension Verbalized understanding;Returned demonstration;Verbal cues required             PT Long Term Goals - 08/05/15 1011    PT LONG TERM GOAL #1   Title Patient will be independent in home exercise program to improve strength/mobility for better functional independence with ADLs. by 09/02/15   Time 6   Period Weeks   Status On-going   PT LONG TERM GOAL #2   Title  Patient will be able to perform household work/ chores without increase in symptoms. by 09/02/15   Time 6   Period Weeks   Status On-going   PT LONG TERM GOAL #3   Title Patient will increase BUE gross strength to at least 4/5 without pain during LUE MMT for increased tolerance with ADLs including light lifting by 09/02/15   Time 6   Period Weeks   Status On-going   PT LONG TERM GOAL #4   Title Patient  will improve AROM UE so they are able to perform overhead ADL's such as reaching into cabinets without pain by 09/02/15   Time 6   Period Weeks   Status On-going               Plan - 08/26/15 1249    Clinical Impression Statement Patient was late for appointment; Instructed patient in postural strengthening exercise. Advanced HEP- see patient instructions. She was able to demonstrate better scapular retraction with less pain. She would benefit from additional skilled PT intervention to improve postural strength and reduce pain with ADLs.    Rehab Potential Fair   Clinical Impairments Affecting Rehab Potential positive: motivated, has good support through daughter; negative: chronic condition, co-morbidities; Patient's clinical presentation is stable as her pain is localized to just left scapula and doesn't vary much;    PT Frequency 2x / week   PT Duration 4 weeks   PT Treatment/Interventions ADLs/Self Care Home Management;Cryotherapy;Electrical Stimulation;Moist Heat;Therapeutic exercise;Therapeutic activities;Functional mobility training;Ultrasound;Neuromuscular re-education;Patient/family education;Manual techniques;Taping;Energy conservation;Dry needling   PT Next Visit Plan advance HEP, manual therapy   PT Home Exercise Plan advanced., see patient instructions;    Consulted and Agree with Plan of Care Patient      Patient will benefit from skilled therapeutic intervention in order to improve the following deficits and impairments:  Decreased endurance, Hypomobility, Decreased activity tolerance, Decreased strength, Impaired UE functional use, Pain, Increased muscle spasms, Decreased range of motion, Improper body mechanics, Postural dysfunction  Visit Diagnosis: Muscle weakness (generalized)  Abnormal posture     Problem List Patient Active Problem List   Diagnosis Date Noted  . History of hypothyroidism 07/01/2015  . Anxiety and depression 07/01/2015  . Insomnia  07/01/2015  . Rheumatoid arthritis ([place]) 07/01/2015  . Microcytic anemia 07/01/2015  . Pain of left scapula 07/01/2015  . HLD (hyperlipidemia) 06/12/2015  . Fibromyalgia 06/12/2015    Patrcia Schnepp PT, DPT 08/26/2015, 12:55 PM  [place] MAIN [facility] SERVICES [address] [place], [place], [address] Phone: [phone]   Fax:  [phone]  Name: Haruka Lemert MRN: [id number] Date of Birth: January 09, 1946

## 2015-08-26 NOTE — Patient Instructions (Signed)
(  Home) External Rotation: Resting Position    Stand with right side close to door, hold red band in left hand, pull left arm out like opening a door;  elbow in, bent to 90, forearm across body. Rotate shoulder by pulling hand away from body. Keep elbow bent to 90, holding roller. Repeat __10__ times per set. Do __2__ sets per session. Do _2___ sessions per week.  Copyright  VHI. All rights reserved.  Scapular Retraction: Elbow Flexion (Standing)    With elbows bent to 90, pinch shoulder blades together and rotate arms out, keeping elbows bent. (For increased resistance, use red band) Repeat _10___ times per set. Do __2__ sets per session. Do __2__ sessions per day.  http://orth.exer.us/949   Copyright  VHI. All rights reserved.  Shoulder (Scapula) Retraction    Pull shoulders back, squeezing shoulder blades together. Hold for 5 seconds Repeat __10__ times per session. Do __5__ sessions per week. Position: Standing   Copyright  VHI. All rights reserved.  All Fours Shoulder Press-Up    Standing with hands on wall, elbows straight; push through arms rounding upper back, then relax. BE SURE TO KEEP ELBOWS STRAIGHT.  Repeat __10__ times or Do _2___ sessions per day.  http://cc.exer.us/61   Copyright  VHI. All rights reserved.  Chest Press    With red band around upper back, holding end in each hand, straighten arms, then keeping elbows straight, punch arms forward rounding upper back; Repeat 10__ times per set. Do _2_ sets per session. Do _5_ sessions per week.  http://tub.exer.us/142   Copyright  VHI. All rights reserved.

## 2015-08-28 ENCOUNTER — Telehealth: Payer: Self-pay | Admitting: Family Medicine

## 2015-08-28 NOTE — Telephone Encounter (Signed)
Can you see if the results have been faxed to Dr. Lacinda Axon? Thanks

## 2015-08-28 NOTE — Telephone Encounter (Signed)
The patient's daughter called to see if the physician had received results from the patient's endoscopy that was done at [facility].

## 2015-09-02 ENCOUNTER — Encounter: Payer: Self-pay | Admitting: Physical Therapy

## 2015-09-02 ENCOUNTER — Ambulatory Visit: Payer: Medicare Other | Admitting: Physical Therapy

## 2015-09-02 DIAGNOSIS — R293 Abnormal posture: Secondary | ICD-10-CM | POA: Diagnosis not present

## 2015-09-02 DIAGNOSIS — M6281 Muscle weakness (generalized): Secondary | ICD-10-CM | POA: Diagnosis not present

## 2015-09-02 NOTE — Patient Instructions (Addendum)
Posture - Sitting    Sit upright, head facing forward. Try using a roll to support lower back. Keep shoulders relaxed, and avoid rounded back. Keep hips level with knees. Avoid crossing legs for long periods. Try using a towel roll behind back as needed to help improve posture Copyright  VHI. All rights reserved.

## 2015-09-02 NOTE — Telephone Encounter (Signed)
Pt daughter was told that we are waiting to see if they are being scanned into her chart. She asked about switching referral to [facility] rheumatology

## 2015-09-02 NOTE — Telephone Encounter (Signed)
Ok by me. Melissa, do I need to place an additional referral?ne

## 2015-09-02 NOTE — Therapy (Signed)
[place] MAIN [facility] SERVICES [address] [place], [place], [address] Phone: [phone]   Fax:  [phone]  Physical Therapy Treatment/Discharge Summary  Patient Details  Name: Audrey Hamilton MRN: [id number] Date of Birth: 1945/07/19 Referring Provider: Jene Every MD  Encounter Date: 09/02/2015      PT End of Session - 09/02/15 1048    Visit Number 4   Number of Visits 13   Date for PT Re-Evaluation 09/02/15   Authorization Type Gcode 4   Authorization Time Period 10   PT Start Time 1015   PT Stop Time 1038   PT Time Calculation (min) 23 min   Activity Tolerance Patient tolerated treatment well;No increased pain   Behavior During Therapy [facility] for tasks assessed/performed      Past Medical History  Diagnosis Date  . Chicken pox   . Depression   . Allergy   . Multiple gastric ulcers   . Hyperlipidemia   . Colon polyps   . Hypokalemia   . Fibromyalgia   . History of hypothyroidism   . Arthritis   . Rheumatoid arthritis ([place]) Since 1995    currently in a flare up  . Anxiety     controlled    Past Surgical History  Procedure Laterality Date  . Tonsillectomy and adenoidectomy  1966  . Abdominal hysterectomy  1974  . Foot surgery Left 11/22/10  . Stomach surgery  12/2001  . Small intestine surgery  12/2001  . Eye surgery    . Upper gastrointestinal endoscopy      There were no vitals filed for this visit.      Subjective Assessment - 09/02/15 1041    Subjective "I feel a lot better. I am able to do all the exercises and I haven't had pain in about a week."    Pertinent History Personal factors affecting rehab: RA, Fibromyalgia, Chronic pain,    How long can you sit comfortably? 15 min   How long can you stand comfortably? 30 min;    How long can you walk comfortably? does walk a lot, not using cane; not able to walk a mile, but has been walking better;    Diagnostic tests Had x-rays; negative for fracture;    Patient  Stated Goals "Use my left arm without pain, be able to move around like I used to"   Currently in Pain? No/denies   Pain Onset More than a month ago            [facility] PT Assessment - 09/02/15 0001    AROM   Overall AROM Comments BUE AROM is WFL, no limitations on LUE and no pain  reported with overhead reach   Strength   Overall Strength Comments BUE gross strength is 4+/5 without pain during MMT       TREATMENT: Warm up on UBE, BUE level 2 x2 min backwards only (unbilled);  HOIST mid row plate #1, 0H68 with cues to increase scapular retraction HOIST lat pulldown plate #1 G88 with cues for correct posture and technique;   Patient reports being independent in all HEP; PT provided green tband to increase difficulty as tolerated; See ROM/strength above;  Patient denies any pain with housework and ADLs;  Educated patient in using towel roll when sitting to improve sitting posture. Patient reports no discomfort with towel roll and reports that she will try that in the car, at a booth or when sitting in the pew at church for better posture  and less discomfort.             PT Education - 2015/09/03 1047    Education provided Yes   Education Details postural education, recommendations   Person(s) Educated Patient   Methods Explanation;Verbal cues   Comprehension Verbalized understanding;Returned demonstration;Verbal cues required             PT Long Term Goals - 09/03/15 1029    PT LONG TERM GOAL #1   Title Patient will be independent in home exercise program to improve strength/mobility for better functional independence with ADLs. by Sep 03, 2015   Time 6   Period Weeks   Status Achieved   PT LONG TERM GOAL #2   Title  Patient will be able to perform household work/ chores without increase in symptoms. by 09/03/15   Time 6   Period Weeks   Status Achieved   PT LONG TERM GOAL #3   Title Patient will increase BUE gross strength to at least 4/5 without pain during LUE  MMT for increased tolerance with ADLs including light lifting by Sep 03, 2015   Time 6   Period Weeks   Status Achieved   PT LONG TERM GOAL #4   Title Patient will improve AROM UE so they are able to perform overhead ADL's such as reaching into cabinets without pain by 03-Sep-2015   Time 6   Period Weeks   Status Achieved               Plan - 09/03/15 1050    Clinical Impression Statement Instructed patient in postural strengthening exercise. Provided green tband for patient to advance HEP as tolerated. Also educated patient in sitting posture with cues to use towel roll as needed for better erect posture. Patient has met all goals. She reports no increase in back pain with ADLs or with work tasks. She is appropriate for DC    Rehab Potential Fair   Clinical Impairments Affecting Rehab Potential positive: motivated, has good support through daughter; negative: chronic condition, co-morbidities; Patient's clinical presentation is stable as her pain is localized to just left scapula and doesn't vary much;    PT Frequency 2x / week   PT Duration 4 weeks   PT Treatment/Interventions ADLs/Self Care Home Management;Cryotherapy;Electrical Stimulation;Moist Heat;Therapeutic exercise;Therapeutic activities;Functional mobility training;Ultrasound;Neuromuscular re-education;Patient/family education;Manual techniques;Taping;Energy conservation;Dry needling   PT Next Visit Plan advance HEP, manual therapy   PT Home Exercise Plan continue as given;    Consulted and Agree with Plan of Care Patient      Patient will benefit from skilled therapeutic intervention in order to improve the following deficits and impairments:  Decreased endurance, Hypomobility, Decreased activity tolerance, Decreased strength, Impaired UE functional use, Pain, Increased muscle spasms, Decreased range of motion, Improper body mechanics, Postural dysfunction  Visit Diagnosis: Muscle weakness (generalized)  Abnormal posture        G-Codes - 09-03-2015 1108    Functional Assessment Tool Used strength, ROM, clinical judgement   Functional Limitation Changing and maintaining body position   Changing and Maintaining Body Position Goal Status (S9628) At least 1 percent but less than 20 percent impaired, limited or restricted   Changing and Maintaining Body Position Discharge Status (Z6629) At least 1 percent but less than 20 percent impaired, limited or restricted      Problem List Patient Active Problem List   Diagnosis Date Noted  . History of hypothyroidism 07/01/2015  . Anxiety and depression 07/01/2015  . Insomnia 07/01/2015  . Rheumatoid arthritis ([place]) 07/01/2015  .  Microcytic anemia 07/01/2015  . Pain of left scapula 07/01/2015  . HLD (hyperlipidemia) 06/12/2015  . Fibromyalgia 06/12/2015    Trotter,Margaret PT, DPT 09/02/2015, 11:08 AM  [place] MAIN [facility] SERVICES [address] [place], [place], [address] Phone: [phone]   Fax:  [phone]  Name: Audrey Hamilton MRN: [id number] Date of Birth: 06-May-1945

## 2015-09-09 ENCOUNTER — Encounter: Payer: Medicare Other | Admitting: Physical Therapy

## 2015-09-09 DIAGNOSIS — M170 Bilateral primary osteoarthritis of knee: Secondary | ICD-10-CM | POA: Diagnosis not present

## 2015-09-09 DIAGNOSIS — M19041 Primary osteoarthritis, right hand: Secondary | ICD-10-CM | POA: Diagnosis not present

## 2015-09-09 DIAGNOSIS — F329 Major depressive disorder, single episode, unspecified: Secondary | ICD-10-CM | POA: Diagnosis not present

## 2015-09-09 DIAGNOSIS — M12872 Other specific arthropathies, not elsewhere classified, left ankle and foot: Secondary | ICD-10-CM | POA: Diagnosis not present

## 2015-09-09 DIAGNOSIS — M12871 Other specific arthropathies, not elsewhere classified, right ankle and foot: Secondary | ICD-10-CM | POA: Diagnosis not present

## 2015-09-09 DIAGNOSIS — M069 Rheumatoid arthritis, unspecified: Secondary | ICD-10-CM | POA: Diagnosis not present

## 2015-09-09 DIAGNOSIS — M797 Fibromyalgia: Secondary | ICD-10-CM | POA: Diagnosis not present

## 2015-09-09 DIAGNOSIS — M19042 Primary osteoarthritis, left hand: Secondary | ICD-10-CM | POA: Diagnosis not present

## 2015-09-12 ENCOUNTER — Encounter: Payer: Self-pay | Admitting: Family Medicine

## 2015-09-12 ENCOUNTER — Telehealth: Payer: Self-pay | Admitting: Family Medicine

## 2015-09-12 NOTE — Telephone Encounter (Signed)
Pt told records were received but awaiting endoscopy results.

## 2015-09-12 NOTE — Telephone Encounter (Signed)
Pt daughter called to follow up on test results from Colonoscopy and Endoscopy in March.   Call daughter @ [phone]. Thank you!

## 2015-09-17 ENCOUNTER — Encounter: Payer: Medicare Other | Admitting: Physical Therapy

## 2015-09-19 DIAGNOSIS — D509 Iron deficiency anemia, unspecified: Secondary | ICD-10-CM | POA: Diagnosis not present

## 2015-09-25 DIAGNOSIS — D509 Iron deficiency anemia, unspecified: Secondary | ICD-10-CM | POA: Diagnosis not present

## 2015-10-01 ENCOUNTER — Encounter: Payer: Self-pay | Admitting: Family Medicine

## 2015-10-01 ENCOUNTER — Ambulatory Visit (INDEPENDENT_AMBULATORY_CARE_PROVIDER_SITE_OTHER): Payer: Medicare Other | Admitting: Family Medicine

## 2015-10-01 VITALS — BP 120/76 | HR 63 | Temp 98.5°F | Ht 63.75 in | Wt 130.5 lb

## 2015-10-01 DIAGNOSIS — F419 Anxiety disorder, unspecified: Principal | ICD-10-CM

## 2015-10-01 DIAGNOSIS — M069 Rheumatoid arthritis, unspecified: Secondary | ICD-10-CM | POA: Diagnosis not present

## 2015-10-01 DIAGNOSIS — F418 Other specified anxiety disorders: Secondary | ICD-10-CM

## 2015-10-01 DIAGNOSIS — D509 Iron deficiency anemia, unspecified: Secondary | ICD-10-CM

## 2015-10-01 DIAGNOSIS — F329 Major depressive disorder, single episode, unspecified: Secondary | ICD-10-CM

## 2015-10-01 DIAGNOSIS — F32A Depression, unspecified: Secondary | ICD-10-CM

## 2015-10-01 MED ORDER — ESCITALOPRAM OXALATE 10 MG PO TABS: 10.0000 mg | ORAL_TABLET | Freq: Every day | ORAL | Status: DC

## 2015-10-01 MED ORDER — LORAZEPAM 1 MG PO TABS: 1.0000 mg | ORAL_TABLET | Freq: Every day | ORAL | Status: DC

## 2015-10-01 MED ORDER — TRAMADOL HCL 50 MG PO TABS: 50.0000 mg | ORAL_TABLET | Freq: Three times a day (TID) | ORAL | Status: DC | PRN

## 2015-10-01 MED ORDER — MONTELUKAST SODIUM 10 MG PO TABS: 10.0000 mg | ORAL_TABLET | Freq: Every day | ORAL | Status: DC

## 2015-10-01 NOTE — Assessment & Plan Note (Signed)
Stable. Continue Lexapro and Ativan. Refilled today.

## 2015-10-01 NOTE — Patient Instructions (Signed)
It was nice to see you today.  I have refilled your medications.  Follow up in 6 months.  Take care  Dr. Lacinda Axon

## 2015-10-01 NOTE — Assessment & Plan Note (Signed)
Pain well controlled. Refilled tramadol. Has established with a rheumatologist. Appears that she will be starting Melbeta soon.

## 2015-10-01 NOTE — Assessment & Plan Note (Signed)
Doing well. Now in iron therapy. Needs labs in ~ 4-6 weeks to recheck. Continue PO Iron.

## 2015-10-01 NOTE — Progress Notes (Signed)
Subjective:  Patient ID: Audrey Hamilton, female    DOB: 1945/06/22  Age: 70 y.o. MRN: [id number]  CC: Follow up  HPI:  70 year old Female with a past medical history of anxiety/depression, hyperlipidemia, rheumatoid arthritis, anemia presents for follow-up.  Anxiety/depression  Stable.  In good spirits.  Needs refill on Ativan and Lexapro.  RA  Has recently established with a local rheumatologist.  Getting ready to start [place].  Pain is well controlled on Tramadol. In need of refill today.  Anemia  Note previously.  Was waiting on EGD and Colonoscopy results from prior GI physician.  She has seen rheumatology recently had a hemoglobin of 9.1 with microcytosis.  She was then sent to GI and was started on Iron supplementation.  She states that she is doing well.  Normal energy level.  Tolerating iron without difficulty.  Social Hx   Social History   Social History  . Marital Status: Widowed    Spouse Name: N/A  . Number of Children: N/A  . Years of Education: N/A   Social History Main Topics  . Smoking status: Former Smoker    Quit date: 06/19/1995  . Smokeless tobacco: Never Used  . Alcohol Use: No  . Drug Use: No  . Sexual Activity: Not Asked   Other Topics Concern  . None   Social History Narrative   Review of Systems  Constitutional: Negative.   Respiratory: Positive for wheezing.   Musculoskeletal: Positive for myalgias and arthralgias.   Objective:  BP 120/76 mmHg  Pulse 63  Temp(Src) 98.5 F (36.9 C) (Oral)  Ht 5' 3.75" (1.619 m)  Wt 130 lb 8 oz (59.194 kg)  BMI 22.58 kg/m2  SpO2 97%  BP/Weight 10/01/2015 0000000  Systolic BP 123456 [id number]  Diastolic BP 76 70  Wt. (Lbs) 130.5 129.13  BMI 22.58 22.35   Physical Exam  Constitutional: She is oriented to person, place, and time. She appears well-developed. No distress.  Cardiovascular: Normal rate and regular rhythm.   Pulmonary/Chest: Effort normal. She has wheezes.    Neurological: She is alert and oriented to person, place, and time.  Psychiatric:  Flat affect.   Lab Results  Component Value Date   WBC 9.7 05/14/2015   HGB 11.3* 05/14/2015   HCT 36 05/14/2015   PLT 340 05/14/2015   CHOL 165 05/14/2015   TRIG 162* 05/14/2015   HDL 68 05/14/2015   LDLCALC 92 05/14/2015   NA 137 05/14/2015   K 4.1 05/14/2015   CREATININE 0.4* 05/14/2015   BUN 16 05/14/2015   TSH 2.52 05/14/2015   Assessment & Plan:   Problem List Items Addressed This Visit    Anxiety and depression - Primary    Stable. Continue Lexapro and Ativan. Refilled today.      Microcytic anemia    Doing well. Now in iron therapy. Needs labs in ~ 4-6 weeks to recheck. Continue PO Iron.      Relevant Medications   Ferrous Fum-Iron Polysacch (TANDEM PO)   Rheumatoid arthritis (H[facility])    Pain well controlled. Refilled tramadol. Has established with a rheumatologist. Appears that she will be starting [place] soon.      Relevant Medications   traMADol (ULTRAM) 50 MG tablet     Meds ordered this encounter  Medications  . omeprazole (PRILOSEC) 20 MG capsule    Sig: Take by mouth.  . Ferrous Fum-Iron Polysacch (TANDEM PO)    Sig: Take 106 mg by mouth daily.  Marland Kitchen escitalopram (LEXAPRO)  10 MG tablet    Sig: Take 1 tablet (10 mg total) by mouth daily.    Dispense:  90 tablet    Refill:  3  . LORazepam (ATIVAN) 1 MG tablet    Sig: Take 1 tablet (1 mg total) by mouth at bedtime.    Dispense:  30 tablet    Refill:  0  . montelukast (SINGULAIR) 10 MG tablet    Sig: Take 1 tablet (10 mg total) by mouth at bedtime.    Dispense:  90 tablet    Refill:  3  . traMADol (ULTRAM) 50 MG tablet    Sig: Take 1 tablet (50 mg total) by mouth every 8 (eight) hours as needed.    Dispense:  60 tablet    Refill:  3   Follow-up: 6 months.  [place]

## 2015-10-23 ENCOUNTER — Other Ambulatory Visit: Payer: Self-pay | Admitting: Family Medicine

## 2015-10-23 NOTE — Telephone Encounter (Signed)
Pt would like to have her traMADol (ULTRAM) 50 MG tablet refilled. Please call her daughter Hendricks Milo, [phone].

## 2015-10-23 NOTE — Telephone Encounter (Signed)
This was refilled on 6/13, please advise, thanks

## 2015-10-23 NOTE — Telephone Encounter (Signed)
Pt's daughter stated that the paper Rx where found. She apologize for the confusion.

## 2015-10-24 NOTE — Telephone Encounter (Signed)
Noted thanks °

## 2015-10-25 DIAGNOSIS — H524 Presbyopia: Secondary | ICD-10-CM | POA: Diagnosis not present

## 2015-10-25 DIAGNOSIS — H52223 Regular astigmatism, bilateral: Secondary | ICD-10-CM | POA: Diagnosis not present

## 2015-10-25 DIAGNOSIS — H5203 Hypermetropia, bilateral: Secondary | ICD-10-CM | POA: Diagnosis not present

## 2015-10-25 DIAGNOSIS — H43811 Vitreous degeneration, right eye: Secondary | ICD-10-CM | POA: Diagnosis not present

## 2015-11-04 DIAGNOSIS — Z79899 Other long term (current) drug therapy: Secondary | ICD-10-CM | POA: Diagnosis not present

## 2015-11-04 DIAGNOSIS — M0609 Rheumatoid arthritis without rheumatoid factor, multiple sites: Secondary | ICD-10-CM | POA: Diagnosis not present

## 2015-11-04 DIAGNOSIS — M170 Bilateral primary osteoarthritis of knee: Secondary | ICD-10-CM | POA: Diagnosis not present

## 2015-11-04 DIAGNOSIS — D509 Iron deficiency anemia, unspecified: Secondary | ICD-10-CM | POA: Diagnosis not present

## 2015-11-28 ENCOUNTER — Other Ambulatory Visit: Payer: Self-pay | Admitting: Family Medicine

## 2015-11-28 NOTE — Telephone Encounter (Signed)
Refilled 10/01/15. Last seen 10/01/15. Please advise?

## 2015-11-29 ENCOUNTER — Other Ambulatory Visit: Payer: Self-pay | Admitting: Family Medicine

## 2015-11-29 NOTE — Telephone Encounter (Signed)
Prescription faxed

## 2015-12-20 DIAGNOSIS — Z79899 Other long term (current) drug therapy: Secondary | ICD-10-CM | POA: Diagnosis not present

## 2015-12-20 DIAGNOSIS — M0609 Rheumatoid arthritis without rheumatoid factor, multiple sites: Secondary | ICD-10-CM | POA: Diagnosis not present

## 2015-12-31 ENCOUNTER — Other Ambulatory Visit: Payer: Self-pay | Admitting: Family Medicine

## 2015-12-31 NOTE — Telephone Encounter (Signed)
Last refilled on 11/28/15. Pt last seen on 10/01/15. Please advise?

## 2015-12-31 NOTE — Telephone Encounter (Signed)
Faxed

## 2016-01-02 DIAGNOSIS — Z23 Encounter for immunization: Secondary | ICD-10-CM | POA: Diagnosis not present

## 2016-01-06 DIAGNOSIS — M0609 Rheumatoid arthritis without rheumatoid factor, multiple sites: Secondary | ICD-10-CM | POA: Diagnosis not present

## 2016-01-06 DIAGNOSIS — E785 Hyperlipidemia, unspecified: Secondary | ICD-10-CM | POA: Diagnosis not present

## 2016-01-06 DIAGNOSIS — F329 Major depressive disorder, single episode, unspecified: Secondary | ICD-10-CM | POA: Diagnosis not present

## 2016-01-06 DIAGNOSIS — M797 Fibromyalgia: Secondary | ICD-10-CM | POA: Diagnosis not present

## 2016-01-06 DIAGNOSIS — M65311 Trigger thumb, right thumb: Secondary | ICD-10-CM | POA: Diagnosis not present

## 2016-01-06 DIAGNOSIS — Z79899 Other long term (current) drug therapy: Secondary | ICD-10-CM | POA: Diagnosis not present

## 2016-01-06 DIAGNOSIS — M65342 Trigger finger, left ring finger: Secondary | ICD-10-CM | POA: Diagnosis not present

## 2016-01-06 DIAGNOSIS — M170 Bilateral primary osteoarthritis of knee: Secondary | ICD-10-CM | POA: Diagnosis not present

## 2016-01-26 ENCOUNTER — Other Ambulatory Visit: Payer: Self-pay | Admitting: Family Medicine

## 2016-01-27 NOTE — Telephone Encounter (Signed)
Faxed

## 2016-01-27 NOTE — Telephone Encounter (Signed)
Refilled on 12/26/15. Pt last seen 10/01/15. Pt has a future appt 04/01/16. Please advise?

## 2016-02-25 ENCOUNTER — Other Ambulatory Visit: Payer: Self-pay | Admitting: Family Medicine

## 2016-02-26 NOTE — Telephone Encounter (Signed)
Tramadol last filled 01/26/16. Ativan last filled 01/28/16. Last OV 10/01/15. Has follow up appt on 04/01/16.

## 2016-02-26 NOTE — Telephone Encounter (Signed)
faxed

## 2016-03-26 ENCOUNTER — Other Ambulatory Visit: Payer: Self-pay | Admitting: Family Medicine

## 2016-03-26 NOTE — Telephone Encounter (Signed)
Last filled 02/26/16. Last OV 10/01/15. Has follow up visit on 04/01/16.

## 2016-03-27 NOTE — Telephone Encounter (Signed)
rx faxed

## 2016-04-01 ENCOUNTER — Encounter: Payer: Self-pay | Admitting: Family Medicine

## 2016-04-01 ENCOUNTER — Ambulatory Visit (INDEPENDENT_AMBULATORY_CARE_PROVIDER_SITE_OTHER): Payer: Medicare Other | Admitting: Family Medicine

## 2016-04-01 VITALS — BP 113/71 | HR 77 | Temp 98.3°F | Resp 14 | Wt 136.1 lb

## 2016-04-01 DIAGNOSIS — M797 Fibromyalgia: Secondary | ICD-10-CM

## 2016-04-01 DIAGNOSIS — G4700 Insomnia, unspecified: Secondary | ICD-10-CM | POA: Diagnosis not present

## 2016-04-01 DIAGNOSIS — F329 Major depressive disorder, single episode, unspecified: Secondary | ICD-10-CM

## 2016-04-01 DIAGNOSIS — M069 Rheumatoid arthritis, unspecified: Secondary | ICD-10-CM | POA: Diagnosis not present

## 2016-04-01 DIAGNOSIS — F32A Depression, unspecified: Secondary | ICD-10-CM

## 2016-04-01 DIAGNOSIS — F418 Other specified anxiety disorders: Secondary | ICD-10-CM

## 2016-04-01 DIAGNOSIS — D509 Iron deficiency anemia, unspecified: Secondary | ICD-10-CM

## 2016-04-01 DIAGNOSIS — F419 Anxiety disorder, unspecified: Secondary | ICD-10-CM

## 2016-04-01 LAB — IRON,TIBC AND FERRITIN PANEL
%SAT: 4 % — AB (ref 11–50)
FERRITIN: 72 ng/mL (ref 20–288)
Iron: 11 ug/dL — ABNORMAL LOW (ref 45–160)
TIBC: 260 ug/dL (ref 250–450)

## 2016-04-01 LAB — CBC
HEMATOCRIT: 31 % — AB (ref 36.0–46.0)
HEMOGLOBIN: 10.2 g/dL — AB (ref 12.0–15.0)
MCHC: 32.8 g/dL (ref 30.0–36.0)
MCV: 84.3 fl (ref 78.0–100.0)
PLATELETS: 455 10*3/uL — AB (ref 150.0–400.0)
RBC: 3.67 Mil/uL — AB (ref 3.87–5.11)
RDW: 16.6 % — ABNORMAL HIGH (ref 11.5–15.5)
WBC: 8.4 10*3/uL (ref 4.0–10.5)

## 2016-04-01 MED ORDER — TRAMADOL HCL 50 MG PO TABS: 50.0000 mg | ORAL_TABLET | Freq: Three times a day (TID) | ORAL | 1 refills | Status: DC | PRN

## 2016-04-01 NOTE — Assessment & Plan Note (Signed)
Stable, improving. Patient doing well on leflunomide. Continuing tramadol as needed. Rx faxed today.

## 2016-04-01 NOTE — Patient Instructions (Signed)
We will call regarding your lab results.  Follow up in 6 months.  Take care  Dr. Lacinda Axon

## 2016-04-01 NOTE — Assessment & Plan Note (Signed)
Stable.  Continue Lexapro and Ativan. 

## 2016-04-01 NOTE — Progress Notes (Signed)
Subjective:  Patient ID: Audrey Hamilton, female    DOB: 19-Feb-1946  Age: 70 y.o. MRN: [id number]  CC: Follow up  HPI:  70 year old female with anxiety/depression, fibromyalgia, HLD, RA, anemia presents for follow up.  RA  Patient states that her pain is improving on the leflunomide and tramadol.  No recent prednisone use.  Needs refill on Tramadol.  Anemia  Hemoglobin is normalized based on recent labs.  She's had recent increase in thirst and fatigue which was characteristic of her prior issues with worsening anemia.  She would like to have her hemoglobin rechecked today.  She is currently taking iron supplementation. This was previously stopped by rheumatology but she has restarted given symptoms.  Fibromyalgia  Stable at this time.  Doing fair on PRN Tramadol.  Anxiety, Insomnia  Stable on Ativan. Needs refill.  Social Hx   Social History   Social History  . Marital status: Widowed    Spouse name: N/A  . Number of children: N/A  . Years of education: N/A   Social History Main Topics  . Smoking status: Former Smoker    Quit date: 06/19/1995  . Smokeless tobacco: Never Used  . Alcohol use No  . Drug use: No  . Sexual activity: Not Asked   Other Topics Concern  . None   Social History Narrative  . None   Review of Systems  Constitutional: Positive for fatigue.  Endocrine:       Increased thirst.  Musculoskeletal: Positive for arthralgias and myalgias.   Objective:  BP 113/71 (BP Location: Left Arm, Patient Position: Sitting, Cuff Size: Normal)   Pulse 77   Temp 98.3 F (36.8 C) (Oral)   Resp 14   Wt 136 lb 2 oz (61.7 kg)   SpO2 95%   BMI 23.55 kg/m   BP/Weight 04/01/2016 10/01/2015 0000000  Systolic BP 123456 123456 [id number]  Diastolic BP 71 76 70  Wt. (Lbs) 136.13 130.5 129.13  BMI 23.55 22.58 22.35   Physical Exam  Constitutional: She is oriented to person, place, and time. She appears well-developed. No distress.  Cardiovascular: Normal  rate and regular rhythm.   Pulmonary/Chest: Effort normal and breath sounds normal. She has no wheezes. She has no rales.  Neurological: She is alert and oriented to person, place, and time.  Psychiatric: She has a normal mood and affect.  Vitals reviewed.  Lab Results  Component Value Date   WBC 9.7 05/14/2015   HGB 11.3 (A) 05/14/2015   HCT 36 05/14/2015   PLT 340 05/14/2015   CHOL 165 05/14/2015   TRIG 162 (A) 05/14/2015   HDL 68 05/14/2015   LDLCALC 92 05/14/2015   NA 137 05/14/2015   K 4.1 05/14/2015   CREATININE 0.4 (A) 05/14/2015   BUN 16 05/14/2015   TSH 2.52 05/14/2015    Assessment & Plan:   Problem List Items Addressed This Visit    Rheumatoid arthritis ([place])    Stable, improving. Patient doing well on leflunomide. Continuing tramadol as needed. Rx faxed today.      Relevant Medications   traMADol (ULTRAM) 50 MG tablet   leflunomide (ARAVA) 10 MG tablet   Microcytic anemia - Primary    Given patient's recent symptoms, or reevaluating with CBC and iron studies today.      Relevant Orders   CBC   Iron, TIBC and Ferritin Panel   Insomnia    Stable. Continue Ativan.      Fibromyalgia    Stable. Continue PRN Tramadol.  Anxiety and depression    Stable. Continue Lexapro and Ativan.         Meds ordered this encounter  Medications  . traMADol (ULTRAM) 50 MG tablet    Sig: Take 1 tablet (50 mg total) by mouth every 8 (eight) hours as needed.    Dispense:  90 tablet    Refill:  1    Not to exceed 2 additional fills before 04/21/2016  . leflunomide (ARAVA) 10 MG tablet    Sig: 1 tab daily x 90 days    Follow-up: 6 months.  [place]

## 2016-04-01 NOTE — Assessment & Plan Note (Signed)
Stable. Continue PRN Tramadol.

## 2016-04-01 NOTE — Assessment & Plan Note (Signed)
Given patient's recent symptoms, or reevaluating with CBC and iron studies today.

## 2016-04-01 NOTE — Progress Notes (Signed)
Pre visit review using our clinic review tool, if applicable. No additional management support is needed unless otherwise documented below in the visit note. 

## 2016-04-01 NOTE — Assessment & Plan Note (Signed)
Stable. Continue Ativan. 

## 2016-04-02 ENCOUNTER — Other Ambulatory Visit: Payer: Self-pay | Admitting: Family Medicine

## 2016-04-21 DIAGNOSIS — M0609 Rheumatoid arthritis without rheumatoid factor, multiple sites: Secondary | ICD-10-CM | POA: Diagnosis not present

## 2016-04-21 DIAGNOSIS — Z79899 Other long term (current) drug therapy: Secondary | ICD-10-CM | POA: Diagnosis not present

## 2016-05-13 DIAGNOSIS — M0609 Rheumatoid arthritis without rheumatoid factor, multiple sites: Secondary | ICD-10-CM | POA: Diagnosis not present

## 2016-05-13 DIAGNOSIS — R700 Elevated erythrocyte sedimentation rate: Secondary | ICD-10-CM | POA: Diagnosis not present

## 2016-05-13 DIAGNOSIS — M170 Bilateral primary osteoarthritis of knee: Secondary | ICD-10-CM | POA: Diagnosis not present

## 2016-05-13 DIAGNOSIS — Z79899 Other long term (current) drug therapy: Secondary | ICD-10-CM | POA: Diagnosis not present

## 2016-06-22 ENCOUNTER — Other Ambulatory Visit: Payer: Self-pay | Admitting: Family Medicine

## 2016-06-22 DIAGNOSIS — Z1231 Encounter for screening mammogram for malignant neoplasm of breast: Secondary | ICD-10-CM

## 2016-06-25 ENCOUNTER — Other Ambulatory Visit: Payer: Self-pay | Admitting: Family Medicine

## 2016-06-26 NOTE — Telephone Encounter (Signed)
Refilled: 03/27/16 Last OV: 04/01/16 Last Labs: 04/01/16 Future OV: 10/01/16 Please advise?

## 2016-06-29 NOTE — Telephone Encounter (Signed)
Faxed

## 2016-07-27 ENCOUNTER — Other Ambulatory Visit: Payer: Self-pay | Admitting: Family Medicine

## 2016-07-27 NOTE — Telephone Encounter (Signed)
Refilled: 04/01/16 Last OV: 04/01/16 Last Labs: 04/01/16 Future OV: 10/02/15 Please advise?

## 2016-07-27 NOTE — Telephone Encounter (Signed)
Faxed

## 2016-08-03 ENCOUNTER — Ambulatory Visit
Admission: RE | Admit: 2016-08-03 | Discharge: 2016-08-03 | Disposition: A | Discharge status: Home / Self Care | Payer: Medicare Other | Source: Ambulatory Visit | Attending: Family Medicine | Admitting: Family Medicine

## 2016-08-03 ENCOUNTER — Other Ambulatory Visit: Payer: Self-pay | Admitting: Family Medicine

## 2016-08-03 DIAGNOSIS — Z1231 Encounter for screening mammogram for malignant neoplasm of breast: Secondary | ICD-10-CM

## 2016-08-03 DIAGNOSIS — M0609 Rheumatoid arthritis without rheumatoid factor, multiple sites: Secondary | ICD-10-CM | POA: Diagnosis not present

## 2016-08-03 DIAGNOSIS — Z79899 Other long term (current) drug therapy: Secondary | ICD-10-CM | POA: Diagnosis not present

## 2016-08-12 ENCOUNTER — Other Ambulatory Visit: Payer: Self-pay | Admitting: *Deleted

## 2016-08-12 ENCOUNTER — Inpatient Hospital Stay
Admission: RE | Admit: 2016-08-12 | Discharge: 2016-08-12 | Disposition: A | Discharge status: Home / Self Care | Payer: Self-pay | Source: Ambulatory Visit | Attending: *Deleted | Admitting: *Deleted

## 2016-08-12 DIAGNOSIS — Z9289 Personal history of other medical treatment: Secondary | ICD-10-CM

## 2016-08-13 ENCOUNTER — Other Ambulatory Visit: Payer: Self-pay | Admitting: Family Medicine

## 2016-08-13 DIAGNOSIS — R928 Other abnormal and inconclusive findings on diagnostic imaging of breast: Secondary | ICD-10-CM

## 2016-08-13 DIAGNOSIS — N6320 Unspecified lump in the left breast, unspecified quadrant: Secondary | ICD-10-CM

## 2016-08-20 ENCOUNTER — Ambulatory Visit
Admission: RE | Admit: 2016-08-20 | Discharge: 2016-08-20 | Disposition: A | Discharge status: Home / Self Care | Payer: Medicare Other | Source: Ambulatory Visit | Attending: Family Medicine | Admitting: Family Medicine

## 2016-08-20 DIAGNOSIS — N6320 Unspecified lump in the left breast, unspecified quadrant: Secondary | ICD-10-CM | POA: Diagnosis present

## 2016-08-20 DIAGNOSIS — X58XXXA Exposure to other specified factors, initial encounter: Secondary | ICD-10-CM | POA: Insufficient documentation

## 2016-08-20 DIAGNOSIS — R928 Other abnormal and inconclusive findings on diagnostic imaging of breast: Secondary | ICD-10-CM

## 2016-08-20 DIAGNOSIS — T8549XA Other mechanical complication of breast prosthesis and implant, initial encounter: Secondary | ICD-10-CM | POA: Diagnosis not present

## 2016-08-20 DIAGNOSIS — Z9882 Breast implant status: Secondary | ICD-10-CM | POA: Diagnosis not present

## 2016-08-20 DIAGNOSIS — N6002 Solitary cyst of left breast: Secondary | ICD-10-CM | POA: Diagnosis not present

## 2016-08-20 DIAGNOSIS — N6012 Diffuse cystic mastopathy of left breast: Secondary | ICD-10-CM | POA: Diagnosis not present

## 2016-09-10 DIAGNOSIS — M170 Bilateral primary osteoarthritis of knee: Secondary | ICD-10-CM | POA: Diagnosis not present

## 2016-09-10 DIAGNOSIS — M65311 Trigger thumb, right thumb: Secondary | ICD-10-CM | POA: Diagnosis not present

## 2016-09-10 DIAGNOSIS — M65342 Trigger finger, left ring finger: Secondary | ICD-10-CM | POA: Diagnosis not present

## 2016-09-10 DIAGNOSIS — Z79899 Other long term (current) drug therapy: Secondary | ICD-10-CM | POA: Diagnosis not present

## 2016-09-10 DIAGNOSIS — M0609 Rheumatoid arthritis without rheumatoid factor, multiple sites: Secondary | ICD-10-CM | POA: Diagnosis not present

## 2016-09-23 ENCOUNTER — Other Ambulatory Visit: Payer: Self-pay | Admitting: Family Medicine

## 2016-09-23 NOTE — Telephone Encounter (Signed)
Last office visit  04/01/16 Next office visit 10/01/16

## 2016-09-24 NOTE — Telephone Encounter (Signed)
Script faxed to [facility] [address]

## 2016-10-01 ENCOUNTER — Encounter: Payer: Self-pay | Admitting: Family Medicine

## 2016-10-01 ENCOUNTER — Ambulatory Visit (INDEPENDENT_AMBULATORY_CARE_PROVIDER_SITE_OTHER): Payer: Medicare Other | Admitting: Family Medicine

## 2016-10-01 DIAGNOSIS — M069 Rheumatoid arthritis, unspecified: Secondary | ICD-10-CM

## 2016-10-01 DIAGNOSIS — F32A Depression, unspecified: Secondary | ICD-10-CM

## 2016-10-01 DIAGNOSIS — F419 Anxiety disorder, unspecified: Secondary | ICD-10-CM | POA: Diagnosis not present

## 2016-10-01 DIAGNOSIS — D509 Iron deficiency anemia, unspecified: Secondary | ICD-10-CM | POA: Diagnosis not present

## 2016-10-01 DIAGNOSIS — F329 Major depressive disorder, single episode, unspecified: Secondary | ICD-10-CM

## 2016-10-01 MED ORDER — TRAMADOL HCL 50 MG PO TABS: 50.0000 mg | ORAL_TABLET | Freq: Three times a day (TID) | ORAL | 1 refills | Status: DC | PRN

## 2016-10-01 NOTE — Patient Instructions (Signed)
Lexapro 5 mg x 1 week, then 5 mg every other day x 1 week, then stop.  Continue your other meds.  Follow up in 6 months  Take care  Dr. Lacinda Axon

## 2016-10-01 NOTE — Assessment & Plan Note (Signed)
Stable.  Continue Iron.

## 2016-10-01 NOTE — Assessment & Plan Note (Signed)
Doing well on Leflunomide. Continue.  PRN Tramadol refilled.

## 2016-10-01 NOTE — Progress Notes (Signed)
Subjective:  Patient ID: Audrey Hamilton, female    DOB: Jul 24, 1945  Age: 71 y.o. MRN: [id number]  CC: Follow up  HPI:  71 year old female with rheumatoid arthritis, anxiety and depression, hyperlipidemia, anemia, insomnia presents for follow-up.  RA  Patient states she's doing well on leflunomide.  Following closely with rheumatology.  Needs refill on her tramadol that she uses as needed for pain.  Anemia  Stable.  Most recent hemoglobin was within normal limits. She continues on iron.  Anxiety and depression  Patient states she is doing well. She wants to taper off Lexapro.  Social Hx   Social History   Social History  . Marital status: Widowed    Spouse name: N/A  . Number of children: N/A  . Years of education: N/A   Social History Main Topics  . Smoking status: Former Smoker    Quit date: 06/19/1995  . Smokeless tobacco: Never Used  . Alcohol use No  . Drug use: No  . Sexual activity: Not Asked   Other Topics Concern  . None   Social History Narrative  . None   Review of Systems  Constitutional: Negative.   Musculoskeletal: Positive for arthralgias.    Objective:  BP (!) 158/92 (BP Location: Left Arm, Patient Position: Sitting, Cuff Size: Normal)   Pulse 65   Temp 98.3 F (36.8 C) (Oral)   Resp 16   Wt 145 lb 8 oz (66 kg)   SpO2 96%   BMI 25.17 kg/m   BP/Weight 10/01/2016 04/01/2016 11/30/7515  Systolic BP 001 749 449  Diastolic BP 92 71 76  Wt. (Lbs) 145.5 136.13 130.5  BMI 25.17 23.55 22.58    Physical Exam  Constitutional: She is oriented to person, place, and time. She appears well-developed. No distress.  Cardiovascular: Normal rate and regular rhythm.   Pulmonary/Chest: Effort normal and breath sounds normal.  Neurological: She is alert and oriented to person, place, and time.  Psychiatric: She has a normal mood and affect.  Vitals reviewed.  Lab Results  Component Value Date   WBC 8.4 04/01/2016   HGB 10.2 (L) 04/01/2016     HCT 31.0 (L) 04/01/2016   PLT 455.0 (H) 04/01/2016   CHOL 165 05/14/2015   TRIG 162 (A) 05/14/2015   HDL 68 05/14/2015   LDLCALC 92 05/14/2015   NA 137 05/14/2015   K 4.1 05/14/2015   CREATININE 0.4 (A) 05/14/2015   BUN 16 05/14/2015   TSH 2.52 05/14/2015    Assessment & Plan:   Problem List Items Addressed This Visit      Musculoskeletal and Integument   Rheumatoid arthritis ([place])    Doing well on Leflunomide. Continue.  PRN Tramadol refilled.       Relevant Medications   traMADol (ULTRAM) 50 MG tablet     Other   Iron deficiency anemia    Stable.  Continue Iron.      Anxiety and depression    Doing well. Tapering off lexapro. Instructions given.          Meds ordered this encounter  Medications  . Levocetirizine Dihydrochloride (XYZAL ALLERGY 24HR PO)    Sig: Take 1 tablet by mouth daily.  . traMADol (ULTRAM) 50 MG tablet    Sig: Take 1 tablet (50 mg total) by mouth every 8 (eight) hours as needed.    Dispense:  90 tablet    Refill:  1    Not to exceed 4 additional fills before 09/28/2016.   Follow-up: 6  months  [place]

## 2016-10-01 NOTE — Assessment & Plan Note (Signed)
Doing well. Tapering off lexapro. Instructions given.

## 2016-10-07 ENCOUNTER — Other Ambulatory Visit: Payer: Self-pay | Admitting: Family Medicine

## 2016-10-21 ENCOUNTER — Other Ambulatory Visit: Payer: Self-pay | Admitting: Family Medicine

## 2016-11-02 DIAGNOSIS — M0609 Rheumatoid arthritis without rheumatoid factor, multiple sites: Secondary | ICD-10-CM | POA: Diagnosis not present

## 2016-11-02 DIAGNOSIS — Z79899 Other long term (current) drug therapy: Secondary | ICD-10-CM | POA: Diagnosis not present

## 2016-12-13 DIAGNOSIS — Z23 Encounter for immunization: Secondary | ICD-10-CM | POA: Diagnosis not present

## 2016-12-23 ENCOUNTER — Other Ambulatory Visit: Payer: Self-pay | Admitting: Family Medicine

## 2016-12-23 NOTE — Telephone Encounter (Signed)
Last OV 10/01/16 Last refill 09/24/16 #90 no refills Please advise, thanks

## 2016-12-24 NOTE — Telephone Encounter (Signed)
Script faxed.

## 2017-01-14 DIAGNOSIS — M170 Bilateral primary osteoarthritis of knee: Secondary | ICD-10-CM | POA: Diagnosis not present

## 2017-01-14 DIAGNOSIS — M19041 Primary osteoarthritis, right hand: Secondary | ICD-10-CM | POA: Diagnosis not present

## 2017-01-14 DIAGNOSIS — Z79899 Other long term (current) drug therapy: Secondary | ICD-10-CM | POA: Diagnosis not present

## 2017-01-14 DIAGNOSIS — M0609 Rheumatoid arthritis without rheumatoid factor, multiple sites: Secondary | ICD-10-CM | POA: Diagnosis not present

## 2017-01-18 ENCOUNTER — Encounter: Payer: Self-pay | Admitting: Occupational Therapy

## 2017-01-18 ENCOUNTER — Ambulatory Visit: Payer: Medicare Other | Attending: Internal Medicine | Admitting: Occupational Therapy

## 2017-01-18 DIAGNOSIS — M898X1 Other specified disorders of bone, shoulder: Secondary | ICD-10-CM | POA: Diagnosis not present

## 2017-01-18 DIAGNOSIS — M6281 Muscle weakness (generalized): Secondary | ICD-10-CM | POA: Insufficient documentation

## 2017-01-18 DIAGNOSIS — M79641 Pain in right hand: Secondary | ICD-10-CM

## 2017-01-18 DIAGNOSIS — R293 Abnormal posture: Secondary | ICD-10-CM | POA: Diagnosis not present

## 2017-01-18 DIAGNOSIS — M25641 Stiffness of right hand, not elsewhere classified: Secondary | ICD-10-CM | POA: Diagnosis not present

## 2017-01-18 NOTE — Patient Instructions (Signed)
R hand contrast  Tendon glides - but do not force keep pain under 2\10 and fist to 3 cm foam block  L hand heat and tendon glides - do not force and to 1-2 cm foam block fist  Opposition   Joint protection principles  Review and hand out provided  Avoid tight and sustained grip - built up handles and pick up and carry objects with palms

## 2017-01-18 NOTE — Therapy (Signed)
[place] [facility] [address] [place], [address] Phone: [phone]   Fax:  [phone]  Occupational Therapy Evaluation  Patient Details  Name: Audrey Hamilton MRN: [id number] Date of Birth: March 16, 1946 Referring Provider: Meda Coffee  Encounter Date: 01/18/2017      OT End of Session - 01/18/17 1053    Visit Number 1   Number of Visits 4   Date for OT Re-Evaluation 02/15/17   OT Start Time 0955   OT Stop Time 1045   OT Time Calculation (min) 50 min   Activity Tolerance Patient tolerated treatment well   Behavior During Therapy [facility] for tasks assessed/performed      Past Medical History:  Diagnosis Date  . Allergy   . Anxiety    controlled  . Arthritis   . Chicken pox   . Colon polyps   . Depression   . Fibromyalgia   . History of hypothyroidism   . Hyperlipidemia   . Hypokalemia   . Multiple gastric ulcers   . Rheumatoid arthritis ([place]) Since 1995   currently in a flare up    Past Surgical History:  Procedure Laterality Date  . ABDOMINAL HYSTERECTOMY  1974  . AUGMENTATION MAMMAPLASTY Bilateral 9794'I   silicone  . EYE SURGERY    . FOOT SURGERY Left 11/22/10  . MASTECTOMY Bilateral 1970's   SUBCUTANEOUS MASTECTOMY - NO CANCER  . SMALL INTESTINE SURGERY  12/2001  . STOMACH SURGERY  12/2001  . TONSILLECTOMY AND ADENOIDECTOMY  1966  . UPPER GASTROINTESTINAL ENDOSCOPY      There were no vitals filed for this visit.      Subjective Assessment - 01/18/17 0954    Subjective  R hand worse than L - stiffness in the am - trigger fingers had but better - but stiffness - decrease strength - cannot open bottles- and doing some exercises with my hands - last month my hands worse    Pertinent History Had OA and RA for few years -  was taking metrotrexad before but  whitecell count increase - hands got  gradually got worse - on low dose of leflunode now    Patient Stated Goals I want my hands to have less  pain  - make better fist with R hand - to open bottles, pick up groceries easier    Currently in Pain? Yes   Pain Score 3    Pain Location Hand   Pain Orientation Left   Pain Descriptors / Indicators Aching   Pain Type Chronic pain   Pain Onset More than a month ago           [facility] OT Assessment - 01/18/17 0001      Assessment   Diagnosis OA of hands    Referring Provider Meda Coffee   Onset Date 12/19/16     Balance Screen   Has the patient fallen in the past 6 months No   Has the patient had a decrease in activity level because of a fear of falling?  No   Is the patient reluctant to leave their home because of a fear of falling?  No     Home  Environment   Lives With Alone     Prior Function   Vocation Retired   Leisure R hand dominant - Crafts, scrapbook paint, some cooking , flowers , word puzzles , IPAD , and computer      Strength   Right Hand Grip (lbs) 10  Right Hand Lateral Pinch 8 lbs   Right Hand 3 Point Pinch 8 lbs   Left Hand Grip (lbs) 21   Left Hand Lateral Pinch 8 lbs   Left Hand 3 Point Pinch 7.5 lbs     Right Hand AROM   R Thumb Opposition to Index --  WNL   R Index  MCP 0-90 70 Degrees   R Index PIP 0-100 80 Degrees   R Long  MCP 0-90 75 Degrees   R Long PIP 0-100 80 Degrees   R Ring  MCP 0-90 70 Degrees   R Ring PIP 0-100 80 Degrees   R Little  MCP 0-90 70 Degrees   R Little PIP 0-100 80 Degrees     Left Hand AROM   L Thumb Opposition to Index --  WNL   L Index  MCP 0-90 80 Degrees   L Index PIP 0-100 100 Degrees   L Long  MCP 0-90 85 Degrees   L Long PIP 0-100 100 Degrees   L Ring  MCP 0-90 85 Degrees   L Ring PIP 0-100 100 Degrees   L Little  MCP 0-90 85 Degrees   L Little PIP 0-100 100 Degrees       Fluidotheraoy to R hand and parafin L hand to increase ROM and decrease pain and stiffness  pt to use contrast at home R hand  Review with pt HEP for ROM , joint protection and AE  Hand out provided                   OT  Education - 01/18/17 1052    Education provided Yes   Education Details Findings - and HEP and joint protection and AE    Person(s) Educated Patient   Methods Explanation;Demonstration;Tactile cues;Verbal cues;Handout   Comprehension Verbal cues required;Returned demonstration;Verbalized understanding          OT Short Term Goals - 01/18/17 1057      OT SHORT TERM GOAL #1   Title Pain on PRWHE improve with 15 points    Baseline PRWHE pain score at eval 38/50   Time 3   Period Weeks   Status New   Target Date 02/08/17     OT SHORT TERM GOAL #2   Title Pt to be ind in HEP to decrease pain and increase AROM in R hand to grasp around broom and knife without increase symptoms    Baseline Pain increase to 10/10 end of day , lifting 8/10 - cannot grip around tools - lag about 35 degrees composite in R hand    Time 3   Period Weeks   Status New   Target Date 02/08/17           OT Long Term Goals - 01/18/17 1059      OT LONG TERM GOAL #1   Title Pt to show increase AROM in bilateral hands to touch palm without increase pain more than 3/10    Baseline see flowsheet    Time 4   Period Weeks   Status New   Target Date 02/15/17     OT LONG TERM GOAL #2   Title Grip in bilateral hands increase to more than 25 lbs to report increase ability to hold , carry and lift objects more than 5 lbs    Baseline Pain 8/10 lifting object, grip R 10 lbs and L 21 lbs    Time 4   Period Weeks   Status  New   Target Date 02/08/17     OT LONG TERM GOAL #3   Title Pt to verbalize 3 joint protection and AE to use at home to increase independend and ease of doing ADL's and IADL's    Baseline very little knowledge   Time 4   Period Weeks   Status New   Target Date 02/15/17               Plan - 01/18/17 1053    Clinical Impression Statement Pt refer with diagnosis of OA and hand pain - and history of trigger fingers in R hand and L thumb - R hand 3rd trigger about 2 days ago - and L  thumb about month ago -  pain over R 2nd and 3rd A1pulleys- increase pain  in R hand to 1010 at end of day or carrying heavy objects -  pt show decrease AROM and PROM with increase pain in R hand worse than L , decrease grip in bilateral hands and again R worse than L all limiting her functional use of hands in ADL's and IADL's    Occupational performance deficits (Please refer to evaluation for details): ADL's;IADL's;Play;Leisure   Rehab Potential Fair   OT Frequency 1x / week   OT Duration 4 weeks   OT Treatment/Interventions Self-care/ADL training;Fluidtherapy;Splinting;Patient/family education;Contrast Bath;Ultrasound;Therapeutic exercises;Manual Therapy;Parrafin;Iontophoresis   Plan Assess progress with HEP and joint protection    Clinical Decision Making Several treatment options, min-mod task modification necessary   OT Home Exercise Plan see pt instruction   Consulted and Agree with Plan of Care Patient      Patient will benefit from skilled therapeutic intervention in order to improve the following deficits and impairments:  Decreased range of motion, Impaired flexibility, Increased edema, Impaired UE functional use, Pain, Decreased strength, Decreased knowledge of use of DME, Decreased knowledge of precautions  Visit Diagnosis: Pain in right hand - Plan: Ot plan of care cert/re-cert  Stiffness of right hand, not elsewhere classified - Plan: Ot plan of care cert/re-cert  Muscle weakness (generalized) - Plan: Ot plan of care cert/re-cert      G-Codes - 10/13/92 1629    Functional Assessment Tool Used (Outpatient only) PRWHE , ROM , grip and prehension strength ,clinical judgement    Functional Limitation Self care   Self Care Current Status (W5462) At least 20 percent but less than 40 percent impaired, limited or restricted   Self Care Goal Status (V0350) At least 1 percent but less than 20 percent impaired, limited or restricted      Problem List Patient Active Problem List    Diagnosis Date Noted  . History of hypothyroidism 07/01/2015  . Anxiety and depression 07/01/2015  . Insomnia 07/01/2015  . Rheumatoid arthritis ([place]) 07/01/2015  . Iron deficiency anemia 07/01/2015  . HLD (hyperlipidemia) 06/12/2015  . Fibromyalgia 06/12/2015    Rosalyn Gess OTR/L,CLT  01/18/2017, 4:32 PM  [place] [facility] [address] [place], [address] Phone: [phone]   Fax:  [phone]  Name: Audrey Hamilton MRN: [id number] Date of Birth: 11-28-1945

## 2017-01-22 ENCOUNTER — Other Ambulatory Visit: Payer: Self-pay | Admitting: Family Medicine

## 2017-01-25 ENCOUNTER — Ambulatory Visit: Payer: Medicare Other | Admitting: Occupational Therapy

## 2017-01-25 DIAGNOSIS — R293 Abnormal posture: Secondary | ICD-10-CM

## 2017-01-25 DIAGNOSIS — M79641 Pain in right hand: Secondary | ICD-10-CM | POA: Diagnosis not present

## 2017-01-25 DIAGNOSIS — M6281 Muscle weakness (generalized): Secondary | ICD-10-CM | POA: Diagnosis not present

## 2017-01-25 DIAGNOSIS — M898X1 Other specified disorders of bone, shoulder: Secondary | ICD-10-CM | POA: Diagnosis not present

## 2017-01-25 DIAGNOSIS — M25641 Stiffness of right hand, not elsewhere classified: Secondary | ICD-10-CM | POA: Diagnosis not present

## 2017-01-25 NOTE — Telephone Encounter (Signed)
Last OV was 10/01/16, last refill was at that time, please advise for refill thanks,

## 2017-01-25 NOTE — Telephone Encounter (Signed)
Please fax

## 2017-01-25 NOTE — Therapy (Signed)
[place] [facility] [address], [place], [address] Phone: [phone]   Fax:  [phone]  Occupational Therapy Treatment  Patient Details  Name: Audrey Hamilton MRN: [id number] Date of Birth: 09-10-1945 Referring Provider: Meda Coffee  Encounter Date: 01/25/2017      OT End of Session - 01/25/17 1649    Visit Number 2   Number of Visits 4   Date for OT Re-Evaluation 02/15/17   OT Start Time 1102   OT Stop Time 1133   OT Time Calculation (min) 31 min   Activity Tolerance Patient tolerated treatment well   Behavior During Therapy [facility] for tasks assessed/performed      Past Medical History:  Diagnosis Date  . Allergy   . Anxiety    controlled  . Arthritis   . Chicken pox   . Colon polyps   . Depression   . Fibromyalgia   . History of hypothyroidism   . Hyperlipidemia   . Hypokalemia   . Multiple gastric ulcers   . Rheumatoid arthritis ([place]) Since 1995   currently in a flare up    Past Surgical History:  Procedure Laterality Date  . ABDOMINAL HYSTERECTOMY  1974  . AUGMENTATION MAMMAPLASTY Bilateral 3154'M   silicone  . EYE SURGERY    . FOOT SURGERY Left 11/22/10  . MASTECTOMY Bilateral 1970's   SUBCUTANEOUS MASTECTOMY - NO CANCER  . SMALL INTESTINE SURGERY  12/2001  . STOMACH SURGERY  12/2001  . TONSILLECTOMY AND ADENOIDECTOMY  1966  . UPPER GASTROINTESTINAL ENDOSCOPY      There were no vitals filed for this visit.      Subjective Assessment - 01/25/17 1103    Subjective  Pain about the same but exercises doing okay and I think my motion is much better -  I did bring that ball for you to look at that I used for my exercises - pain still increase to 7-8/10 after using it  a lot the day    Patient Stated Goals I want my hands to have less pain  - make better fist with R hand - to open bottles, pick up groceries easier    Currently in Pain? Yes   Pain Score 3    Pain Location Hand   Pain  Orientation Right;Left   Pain Descriptors / Indicators Aching   Pain Type Chronic pain   Pain Onset More than a month ago            [facility] OT Assessment - 01/25/17 0001      Strength   Right Hand Grip (lbs) 12   Right Hand Lateral Pinch 8 lbs   Right Hand 3 Point Pinch 6 lbs   Left Hand Grip (lbs) 15   Left Hand Lateral Pinch 7 lbs   Left Hand 3 Point Pinch 5 lbs     Right Hand AROM   R Index  MCP 0-90 70 Degrees   R Index PIP 0-100 110 Degrees   R Long  MCP 0-90 80 Degrees   R Long PIP 0-100 105 Degrees   R Ring  MCP 0-90 85 Degrees   R Ring PIP 0-100 105 Degrees   R Little  MCP 0-90 90 Degrees   R Little PIP 0-100 100 Degrees     Left Hand AROM   L Index  MCP 0-90 75 Degrees   L Index PIP 0-100 105 Degrees   L Long  MCP 0-90 85 Degrees  L Long PIP 0-100 105 Degrees   L Ring  MCP 0-90 80 Degrees   L Ring PIP 0-100 105 Degrees   L Little  MCP 0-90 80 Degrees   L Little PIP 0-100 105 Degrees      Measurements taken for ROM - great progress in PIP flexion more than MC's  Grip and prehension still  Decrease and pain the same   Less tenderness with soft tissue in R hand  Pain more than 2nd and 3rd MC's  Soft tissue mobs for MC spread , joint mobs to MC's and PIP  Massage to lateral bands of PIP's  Review HEP for tendon glides - pt to focus this week on pain free range - maintain progress and during composite focus on MC flexion  Joint protection review and pt report she is changing some of her act               OT Treatments/Exercises (OP) - 01/25/17 0001      RUE Fluidotherapy   Number Minutes Fluidotherapy 10 Minutes   RUE Fluidotherapy Location Hand;Wrist   Comments AROM for digits and thumb fleixon      LUE Fluidotherapy   Number Minutes Fluidotherapy 10 Minutes   LUE Fluidotherapy Location Hand;Wrist   Comments AROm for digits flexion                 OT Education - 01/25/17 1648    Education provided Yes   Education Details HEP  review - focus on MC flexion during composite    Person(s) Educated Patient   Methods Explanation;Demonstration;Tactile cues;Verbal cues   Comprehension Verbal cues required;Returned demonstration;Verbalized understanding          OT Short Term Goals - 01/18/17 1057      OT SHORT TERM GOAL #1   Title Pain on PRWHE improve with 15 points    Baseline PRWHE pain score at eval 38/50   Time 3   Period Weeks   Status New   Target Date 02/08/17     OT SHORT TERM GOAL #2   Title Pt to be ind in HEP to decrease pain and increase AROM in R hand to grasp around broom and knife without increase symptoms    Baseline Pain increase to 10/10 end of day , lifting 8/10 - cannot grip around tools - lag about 35 degrees composite in R hand    Time 3   Period Weeks   Status New   Target Date 02/08/17           OT Long Term Goals - 01/18/17 1059      OT LONG TERM GOAL #1   Title Pt to show increase AROM in bilateral hands to touch palm without increase pain more than 3/10    Baseline see flowsheet    Time 4   Period Weeks   Status New   Target Date 02/15/17     OT LONG TERM GOAL #2   Title Grip in bilateral hands increase to more than 25 lbs to report increase ability to hold , carry and lift objects more than 5 lbs    Baseline Pain 8/10 lifting object, grip R 10 lbs and L 21 lbs    Time 4   Period Weeks   Status New   Target Date 02/08/17     OT LONG TERM GOAL #3   Title Pt to verbalize 3 joint protection and AE to use at home to increase independend and  ease of doing ADL's and IADL's    Baseline very little knowledge   Time 4   Period Weeks   Status New   Target Date 02/15/17               Plan - 01/25/17 1650    Clinical Impression Statement Pt made great progress in digits flexion at PIP's more than MC- pt show still increase pain - about the same - grip and prehension same - pt to focus this week to decrease pain and maintain ROM - as well as MC flexion during  composite    Occupational performance deficits (Please refer to evaluation for details): ADL's;IADL's;Play;Leisure   Rehab Potential Fair   OT Frequency 1x / week   OT Duration 4 weeks   OT Treatment/Interventions Self-care/ADL training;Fluidtherapy;Splinting;Patient/family education;Contrast Bath;Ultrasound;Therapeutic exercises;Manual Therapy;Parrafin;Iontophoresis   Plan assess MC's flexion , pain improve ? and grip    Clinical Decision Making Several treatment options, min-mod task modification necessary   OT Home Exercise Plan see pt instruction   Consulted and Agree with Plan of Care Patient      Patient will benefit from skilled therapeutic intervention in order to improve the following deficits and impairments:  Decreased range of motion, Impaired flexibility, Increased edema, Impaired UE functional use, Pain, Decreased strength, Decreased knowledge of use of DME, Decreased knowledge of precautions  Visit Diagnosis: Pain in right hand  Stiffness of right hand, not elsewhere classified  Muscle weakness (generalized)  Abnormal posture  Pain in scapula    Problem List Patient Active Problem List   Diagnosis Date Noted  . History of hypothyroidism 07/01/2015  . Anxiety and depression 07/01/2015  . Insomnia 07/01/2015  . Rheumatoid arthritis ([place]) 07/01/2015  . Iron deficiency anemia 07/01/2015  . HLD (hyperlipidemia) 06/12/2015  . Fibromyalgia 06/12/2015    Rosalyn Gess OTR/L,CLT  01/25/2017, 4:53 PM  [place] [facility] [address] [place], [address] Phone: [phone]   Fax:  [phone]  Name: Mekesha Solomon MRN: [id number] Date of Birth: February 25, 1946

## 2017-01-25 NOTE — Patient Instructions (Signed)
Pt to not force motion - pain free AROM  Composite fist to foam block -do composite with more focus no MC flexion

## 2017-01-26 NOTE — Telephone Encounter (Signed)
faxed

## 2017-02-01 ENCOUNTER — Ambulatory Visit: Payer: Medicare Other | Admitting: Occupational Therapy

## 2017-02-01 DIAGNOSIS — M6281 Muscle weakness (generalized): Secondary | ICD-10-CM

## 2017-02-01 DIAGNOSIS — M25641 Stiffness of right hand, not elsewhere classified: Secondary | ICD-10-CM

## 2017-02-01 DIAGNOSIS — R293 Abnormal posture: Secondary | ICD-10-CM | POA: Diagnosis not present

## 2017-02-01 DIAGNOSIS — M79641 Pain in right hand: Secondary | ICD-10-CM

## 2017-02-01 DIAGNOSIS — M898X1 Other specified disorders of bone, shoulder: Secondary | ICD-10-CM | POA: Diagnosis not present

## 2017-02-01 NOTE — Patient Instructions (Signed)
Same HEP but add compression sleeve to middle digit on R hand to decrease pain

## 2017-02-01 NOTE — Therapy (Signed)
[place] [facility] [address], [place], [address] Phone: [phone]   Fax:  [phone]  Occupational Therapy Treatment  Patient Details  Name: Audrey Hamilton MRN: [id number] Date of Birth: [birth year] Referring Provider: Meda Coffee  Encounter Date: 02/01/2017      OT End of Session - 02/01/17 1636    Visit Number 3   Number of Visits 4   Date for OT Re-Evaluation 02/15/17   OT Start Time 1139   OT Stop Time 1225   OT Time Calculation (min) 46 min   Activity Tolerance Patient tolerated treatment well   Behavior During Therapy [facility] for tasks assessed/performed      Past Medical History:  Diagnosis Date  . Allergy   . Anxiety    controlled  . Arthritis   . Chicken pox   . Colon polyps   . Depression   . Fibromyalgia   . History of hypothyroidism   . Hyperlipidemia   . Hypokalemia   . Multiple gastric ulcers   . Rheumatoid arthritis ([place]) Since 1995   currently in a flare up    Past Surgical History:  Procedure Laterality Date  . ABDOMINAL HYSTERECTOMY  1974  . AUGMENTATION MAMMAPLASTY Bilateral 6962'X   silicone  . EYE SURGERY    . FOOT SURGERY Left 11/22/10  . MASTECTOMY Bilateral 1970's   SUBCUTANEOUS MASTECTOMY - NO CANCER  . SMALL INTESTINE SURGERY  12/2001  . STOMACH SURGERY  12/2001  . TONSILLECTOMY AND ADENOIDECTOMY  1966  . UPPER GASTROINTESTINAL ENDOSCOPY      There were no vitals filed for this visit.      Subjective Assessment - 02/01/17 1633    Subjective  Doing better - my pain was the last week between 2-4/10 and that was with rain weather - the R middle finger still sore and cannot make tight fist with that one    Patient Stated Goals I want my hands to have less pain  - make better fist with R hand - to open bottles, pick up groceries easier    Currently in Pain? Yes   Pain Score 2    Pain Location Finger (Comment which one)   Pain Orientation Right   Pain Descriptors /  Indicators Aching   Pain Type Chronic pain            [facility] OT Assessment - 02/01/17 0001      Strength   Right Hand Grip (lbs) 20   Right Hand Lateral Pinch 9 lbs   Right Hand 3 Point Pinch 7 lbs   Left Hand Grip (lbs) 28   Left Hand Lateral Pinch 9.5 lbs   Left Hand 3 Point Pinch 7 lbs     Right Hand AROM   R Index  MCP 0-90 70 Degrees   R Index PIP 0-100 110 Degrees   R Long  MCP 0-90 80 Degrees   R Long PIP 0-100 95 Degrees   R Ring  MCP 0-90 85 Degrees   R Ring PIP 0-100 105 Degrees   R Little  MCP 0-90 90 Degrees   R Little PIP 0-100 100 Degrees     Left Hand AROM   L Index  MCP 0-90 80 Degrees   L Index PIP 0-100 105 Degrees   L Long  MCP 0-90 85 Degrees   L Long PIP 0-100 105 Degrees   L Ring  MCP 0-90 90 Degrees   L Ring PIP 0-100  105 Degrees   L Little  MCP 0-90 90 Degrees   L Little PIP 0-100 105 Degrees                  OT Treatments/Exercises (OP) - 02/01/17 0001      RUE Fluidotherapy   Number Minutes Fluidotherapy 10 Minutes   RUE Fluidotherapy Location Hand;Wrist   Comments AROM for digits and wrist decrease pain and stiffness      LUE Fluidotherapy   Number Minutes Fluidotherapy 10 Minutes   LUE Fluidotherapy Location Hand;Wrist   Comments decrease pain and stiffness - AROM for digits and wrist       Measurements taken for ROM - and grip /prehension - progress in L MC's flexion and grip /prehension   Still tenderness over 3rd  R A1pulley's  And in palm as well as at PIP of 3rd     Soft tissue mobs for MC spread , joint mobs to MC's and PIP  Graston tool nr 2 - brushing and sweepong in palm , A1pulley's - volar 3rd digit Massage to lateral bands of PIP's  Review HEP for tendon glides - pt to cont to  focus this week on pain free range - maintain progress and during composite focus on MC flexion  Joint protection review again and reinforce larger handles- holding leash - avoid tight grip - maybe around wrist           OT  Education - 02/01/17 1636    Education provided Yes   Education Details Joint protection and compression sleeve for middle finger   Person(s) Educated Patient   Methods Explanation;Demonstration;Tactile cues;Verbal cues   Comprehension Verbal cues required;Returned demonstration;Verbalized understanding          OT Short Term Goals - 01/18/17 1057      OT SHORT TERM GOAL #1   Title Pain on PRWHE improve with 15 points    Baseline PRWHE pain score at eval 38/50   Time 3   Period Weeks   Status New   Target Date 02/08/17     OT SHORT TERM GOAL #2   Title Pt to be ind in HEP to decrease pain and increase AROM in R hand to grasp around broom and knife without increase symptoms    Baseline Pain increase to 10/10 end of day , lifting 8/10 - cannot grip around tools - lag about 35 degrees composite in R hand    Time 3   Period Weeks   Status New   Target Date 02/08/17           OT Long Term Goals - 01/18/17 1059      OT LONG TERM GOAL #1   Title Pt to show increase AROM in bilateral hands to touch palm without increase pain more than 3/10    Baseline see flowsheet    Time 4   Period Weeks   Status New   Target Date 02/15/17     OT LONG TERM GOAL #2   Title Grip in bilateral hands increase to more than 25 lbs to report increase ability to hold , carry and lift objects more than 5 lbs    Baseline Pain 8/10 lifting object, grip R 10 lbs and L 21 lbs    Time 4   Period Weeks   Status New   Target Date 02/08/17     OT LONG TERM GOAL #3   Title Pt to verbalize 3 joint protection and AE to use  at home to increase independend and ease of doing ADL's and IADL's    Baseline very little knowledge   Time 4   Period Weeks   Status New   Target Date 02/15/17               Plan - 02/01/17 1638    Clinical Impression Statement Pt this week made great progress in grip and prehension strength - AROM still improved and MC's improved again on L hand - pt do report still  some triggering in R 3rd digit in  am - tender over A1pulley - pt to cont with modifications     Occupational performance deficits (Please refer to evaluation for details): ADL's;IADL's;Play;Leisure   Rehab Potential Fair   OT Frequency 1x / week   OT Duration 4 weeks   OT Treatment/Interventions Self-care/ADL training;Fluidtherapy;Splinting;Patient/family education;Contrast Bath;Ultrasound;Therapeutic exercises;Manual Therapy;Parrafin;Iontophoresis   Plan Assess pain , progress with triggering -    Clinical Decision Making Several treatment options, min-mod task modification necessary   OT Home Exercise Plan see pt instruction   Consulted and Agree with Plan of Care Patient      Patient will benefit from skilled therapeutic intervention in order to improve the following deficits and impairments:  Decreased range of motion, Impaired flexibility, Increased edema, Impaired UE functional use, Pain, Decreased strength, Decreased knowledge of use of DME, Decreased knowledge of precautions  Visit Diagnosis: Pain in right hand  Stiffness of right hand, not elsewhere classified  Muscle weakness (generalized)    Problem List Patient Active Problem List   Diagnosis Date Noted  . History of hypothyroidism 07/01/2015  . Anxiety and depression 07/01/2015  . Insomnia 07/01/2015  . Rheumatoid arthritis ([place]) 07/01/2015  . Iron deficiency anemia 07/01/2015  . HLD (hyperlipidemia) 06/12/2015  . Fibromyalgia 06/12/2015    Rosalyn Gess OTR/l,CLT  02/01/2017, 4:42 PM  [place] [facility] [address] [place], [address] Phone: [phone]   Fax:  [phone]  Name: Audrey Hamilton MRN: [id number] Date of Birth: 1945/12/22

## 2017-02-10 ENCOUNTER — Ambulatory Visit: Payer: Medicare Other | Admitting: Occupational Therapy

## 2017-02-17 ENCOUNTER — Other Ambulatory Visit: Payer: Self-pay | Admitting: Family Medicine

## 2017-02-17 ENCOUNTER — Ambulatory Visit: Payer: Medicare Other | Admitting: Occupational Therapy

## 2017-02-17 DIAGNOSIS — M25641 Stiffness of right hand, not elsewhere classified: Secondary | ICD-10-CM

## 2017-02-17 DIAGNOSIS — R293 Abnormal posture: Secondary | ICD-10-CM | POA: Diagnosis not present

## 2017-02-17 DIAGNOSIS — M6281 Muscle weakness (generalized): Secondary | ICD-10-CM

## 2017-02-17 DIAGNOSIS — M79641 Pain in right hand: Secondary | ICD-10-CM

## 2017-02-17 DIAGNOSIS — M898X1 Other specified disorders of bone, shoulder: Secondary | ICD-10-CM | POA: Diagnosis not present

## 2017-02-17 NOTE — Therapy (Signed)
[place] [facility] [address], [place], [address] Phone: [phone]   Fax:  [phone]  Occupational Therapy Treatment  Patient Details  Name: Audrey Hamilton MRN: [id number] Date of Birth: [birth year] Referring Provider: Meda Coffee  Encounter Date: 02/17/2017      OT End of Session - 02/17/17 6962    Visit Number 4   Number of Visits 12   Date for OT Re-Evaluation 03/10/17   OT Start Time 1140   OT Stop Time 1235   OT Time Calculation (min) 55 min   Activity Tolerance Patient tolerated treatment well   Behavior During Therapy [facility] for tasks assessed/performed      Past Medical History:  Diagnosis Date  . Allergy   . Anxiety    controlled  . Arthritis   . Chicken pox   . Colon polyps   . Depression   . Fibromyalgia   . History of hypothyroidism   . Hyperlipidemia   . Hypokalemia   . Multiple gastric ulcers   . Rheumatoid arthritis ([place]) Since 1995   currently in a flare up    Past Surgical History:  Procedure Laterality Date  . ABDOMINAL HYSTERECTOMY  1974  . AUGMENTATION MAMMAPLASTY Bilateral 9528'U   silicone  . EYE SURGERY    . FOOT SURGERY Left 11/22/10  . MASTECTOMY Bilateral 1970's   SUBCUTANEOUS MASTECTOMY - NO CANCER  . SMALL INTESTINE SURGERY  12/2001  . STOMACH SURGERY  12/2001  . TONSILLECTOMY AND ADENOIDECTOMY  1966  . UPPER GASTROINTESTINAL ENDOSCOPY      There were no vitals filed for this visit.      Subjective Assessment - 02/17/17 1514    Subjective  I am doing so much better- sorry mist last time - my refrigarator went out - my hands better except the R middle finger -still ones to trigger and pain    Patient Stated Goals I want my hands to have less pain  - make better fist with R hand - to open bottles, pick up groceries easier    Currently in Pain? No/denies            [facility] OT Assessment - 02/17/17 0001      Strength   Right Hand Grip (lbs) 20   Left Hand  Grip (lbs) 30     Right Hand AROM   R Long  MCP 0-90 75 Degrees   R Long PIP 0-100 95 Degrees                  OT Treatments/Exercises (OP) - 02/17/17 0001      RUE Fluidotherapy   Number Minutes Fluidotherapy 10 Minutes   RUE Fluidotherapy Location Hand;Wrist   Comments AROM to digits i nall planes to decreaes pain and increase ROM      LUE Fluidotherapy   Number Minutes Fluidotherapy 10 Minutes   LUE Fluidotherapy Location Hand;Wrist   Comments increase ROM       Measurements taken for ROM - and grip /prehension - Still tenderness over 3rd  R A1pulley's  And in palm as well as at PIP of 3rd   And report still triggering    Soft tissue mobs for MC spread , joint mobs to MC's and PIP  Graston tool nr 2 - brushing and sweepong in palm , A1pulley's - volar 3rd digit Massage to lateral bands of R PIP's  Review HEP for tendon glides - pt to cont to  focus  this week on pain free range - maintain progress and during composite focus on MC flexion  Joint protection review again and reinforce larger handles- holding  Ionto done with dexamethazone done at small patch over R 3rd A1pulley - at current at 2 .0 current - and 19 min - skin check done prior and afterwards  Initiated this date ionto and has order  And increase to 2 x wk for 3 wks            OT Education - 02/17/17 1516    Education provided Yes   Education Details review joint protection again - avoid tight and susttained grip on R    Person(s) Educated Patient   Methods Explanation;Demonstration;Tactile cues   Comprehension Returned demonstration;Verbalized understanding          OT Short Term Goals - 02/17/17 1519      OT SHORT TERM GOAL #1   Title Pain on PRWHE improve with 15 points    Baseline PRWHE pain score at eval 38/50- still pain over 3rd digit - A1pulley    Time 3   Period Weeks   Status On-going   Target Date 03/10/17     OT SHORT TERM GOAL #2   Title Pt to be ind in HEP to  decrease pain and increase AROM in R hand to grasp around broom and knife without increase symptoms    Baseline pain and ROM /strength improve but still pain in 3rd digit on R and triggering    Time 3   Period Weeks   Status On-going   Target Date 03/10/17           OT Long Term Goals - 02/17/17 1520      OT LONG TERM GOAL #1   Title Pt to show increase AROM in bilateral hands to touch palm without increase pain more than 3/10    Baseline all digits touching palm except R 3rd digit   Time 3   Period Weeks   Status On-going   Target Date 03/10/17     OT LONG TERM GOAL #2   Title Grip in bilateral hands increase to more than 25 lbs to report increase ability to hold , carry and lift objects more than 5 lbs    Baseline see flowsheet    Status Achieved     OT LONG TERM GOAL #3   Title Pt to verbalize 3 joint protection and AE to use at home to increase independend and ease of doing ADL's and IADL's    Status Achieved               Plan - 02/17/17 1517    Clinical Impression Statement Pt made great progress in 3 visits in ROM , pain in bilateral hands - increase grip and prehension - but cont to be limited in ROM at R 3rd digit and pain over A1pulley - tender - and still some triggering - review again modifying act for gripping    Occupational performance deficits (Please refer to evaluation for details): ADL's;IADL's;Play;Leisure   Rehab Potential Fair   OT Frequency 2x / week   OT Duration --  3 wks   OT Treatment/Interventions Self-care/ADL training;Fluidtherapy;Splinting;Patient/family education;Contrast Bath;Ultrasound;Therapeutic exercises;Manual Therapy;Parrafin;Iontophoresis   Plan assess trigger fingers and pain    Clinical Decision Making Limited treatment options, no task modification necessary   OT Home Exercise Plan see pt instruction   Consulted and Agree with Plan of Care Patient      Patient  will benefit from skilled therapeutic intervention in order  to improve the following deficits and impairments:  Decreased range of motion, Impaired flexibility, Increased edema, Impaired UE functional use, Pain, Decreased strength, Decreased knowledge of use of DME, Decreased knowledge of precautions  Visit Diagnosis: Pain in right hand  Stiffness of right hand, not elsewhere classified  Muscle weakness (generalized)    Problem List Patient Active Problem List   Diagnosis Date Noted  . History of hypothyroidism 07/01/2015  . Anxiety and depression 07/01/2015  . Insomnia 07/01/2015  . Rheumatoid arthritis ([place]) 07/01/2015  . Iron deficiency anemia 07/01/2015  . HLD (hyperlipidemia) 06/12/2015  . Fibromyalgia 06/12/2015    Rosalyn Gess OTR/L,CLT 02/17/2017, 3:22 PM  [place] [facility] [address] [place], [address] Phone: [phone]   Fax:  [phone]  Name: Audrey Hamilton MRN: [id number] Date of Birth: Jun 13, 1945

## 2017-02-17 NOTE — Patient Instructions (Signed)
Same

## 2017-02-19 ENCOUNTER — Ambulatory Visit: Payer: Medicare Other | Attending: Internal Medicine | Admitting: Occupational Therapy

## 2017-02-19 DIAGNOSIS — M79641 Pain in right hand: Secondary | ICD-10-CM | POA: Insufficient documentation

## 2017-02-19 DIAGNOSIS — M25641 Stiffness of right hand, not elsewhere classified: Secondary | ICD-10-CM | POA: Diagnosis not present

## 2017-02-19 DIAGNOSIS — R293 Abnormal posture: Secondary | ICD-10-CM | POA: Insufficient documentation

## 2017-02-19 DIAGNOSIS — M6281 Muscle weakness (generalized): Secondary | ICD-10-CM | POA: Diagnosis not present

## 2017-02-19 DIAGNOSIS — M898X1 Other specified disorders of bone, shoulder: Secondary | ICD-10-CM | POA: Insufficient documentation

## 2017-02-19 NOTE — Therapy (Signed)
[place] [facility] [address], [place], [address] Phone: [phone]   Fax:  [phone]  Occupational Therapy Treatment  Patient Details  Name: Audrey Hamilton MRN: [id number] Date of Birth: 07/17/1945 Referring Provider: Meda Coffee  Encounter Date: 02/19/2017      OT End of Session - 02/19/17 1222    Visit Number 5   Number of Visits 12   Date for OT Re-Evaluation 03/10/17   OT Start Time 1150   OT Stop Time 1234   OT Time Calculation (min) 44 min   Activity Tolerance Patient tolerated treatment well   Behavior During Therapy [facility] for tasks assessed/performed      Past Medical History:  Diagnosis Date  . Allergy   . Anxiety    controlled  . Arthritis   . Chicken pox   . Colon polyps   . Depression   . Fibromyalgia   . History of hypothyroidism   . Hyperlipidemia   . Hypokalemia   . Multiple gastric ulcers   . Rheumatoid arthritis ([place]) Since 1995   currently in a flare up    Past Surgical History:  Procedure Laterality Date  . ABDOMINAL HYSTERECTOMY  1974  . AUGMENTATION MAMMAPLASTY Bilateral 5573'U   silicone  . EYE SURGERY    . FOOT SURGERY Left 11/22/10  . MASTECTOMY Bilateral 1970's   SUBCUTANEOUS MASTECTOMY - NO CANCER  . SMALL INTESTINE SURGERY  12/2001  . STOMACH SURGERY  12/2001  . TONSILLECTOMY AND ADENOIDECTOMY  1966  . UPPER GASTROINTESTINAL ENDOSCOPY      There were no vitals filed for this visit.      Subjective Assessment - 02/19/17 1219    Subjective  I think that patch helped - my hand is not as tender- my finger still stiff and some pain bending it    Patient Stated Goals I want my hands to have less pain  - make better fist with R hand - to open bottles, pick up groceries easier    Currently in Pain? Yes   Pain Score 2    Pain Location Finger (Comment which one)   Pain Orientation Right   Pain Descriptors / Indicators Aching   Pain Type Chronic pain                       OT Treatments/Exercises (OP) - 02/19/17 0001      Iontophoresis   Type of Iontophoresis Dexamethasone   Location R 3rd A1pulley   Dose  small patch  and 2.0 current    Time 19     RUE Fluidotherapy   Number Minutes Fluidotherapy 10 Minutes   RUE Fluidotherapy Location Hand;Wrist   Comments AROM to digits to increase ROM and decrease pain        Still tenderness over 3rd R A1pulley's And in palm as well as at PIP of 3rd - but improved per pt compare to last time  And report still triggering - but did not trigger in session  After fluido show increase ROM and decrease stiffness and pain   Soft tissue mobs for MC spread , joint mobs to MC's and PIP  Graston tool nr 2 - brushing and sweeping in palm , A1pulley's - volar 3rd digit Massage to lateral bands of R PIP's  Review HEP for tendon glides - pt to cont to focus this week on pain free range - maintain progress and during composite focus on MC flexion  But not triggering  Joint protection review again and reinforce larger handles- holding   Did skin check prior to ionto -and and pt to keep patch on for about hour when done - no issues prior - still tender - but per pt not as tender as last time  Still decrease ROM of R 3rd - and pain with MC flexion             OT Education - 02/19/17 1222    Education provided Yes   Education Details affect or ionto - trigger finger anatomy    Person(s) Educated Patient   Methods Explanation;Demonstration;Tactile cues   Comprehension Verbal cues required;Returned demonstration;Verbalized understanding          OT Short Term Goals - 02/17/17 1519      OT SHORT TERM GOAL #1   Title Pain on PRWHE improve with 15 points    Baseline PRWHE pain score at eval 38/50- still pain over 3rd digit - A1pulley    Time 3   Period Weeks   Status On-going   Target Date 03/10/17     OT SHORT TERM GOAL #2   Title Pt to be ind in HEP to decrease pain and  increase AROM in R hand to grasp around broom and knife without increase symptoms    Baseline pain and ROM /strength improve but still pain in 3rd digit on R and triggering    Time 3   Period Weeks   Status On-going   Target Date 03/10/17           OT Long Term Goals - 02/17/17 1520      OT LONG TERM GOAL #1   Title Pt to show increase AROM in bilateral hands to touch palm without increase pain more than 3/10    Baseline all digits touching palm except R 3rd digit   Time 3   Period Weeks   Status On-going   Target Date 03/10/17     OT LONG TERM GOAL #2   Title Grip in bilateral hands increase to more than 25 lbs to report increase ability to hold , carry and lift objects more than 5 lbs    Baseline see flowsheet    Status Achieved     OT LONG TERM GOAL #3   Title Pt to verbalize 3 joint protection and AE to use at home to increase independend and ease of doing ADL's and IADL's    Status Achieved               Plan - 02/19/17 1223    Occupational performance deficits (Please refer to evaluation for details): ADL's;IADL's;Play;Leisure   Rehab Potential Fair   OT Frequency 2x / week   OT Duration --  3 wks   OT Treatment/Interventions Self-care/ADL training;Fluidtherapy;Splinting;Patient/family education;Contrast Bath;Ultrasound;Therapeutic exercises;Manual Therapy;Parrafin;Iontophoresis   Plan assess progress wtih trigger finger and tenderness over A1pulley    Clinical Decision Making Limited treatment options, no task modification necessary   OT Home Exercise Plan see pt instruction   Consulted and Agree with Plan of Care Patient      Patient will benefit from skilled therapeutic intervention in order to improve the following deficits and impairments:  Decreased range of motion, Impaired flexibility, Increased edema, Impaired UE functional use, Pain, Decreased strength, Decreased knowledge of use of DME, Decreased knowledge of precautions  Visit Diagnosis: Pain  in right hand  Stiffness of right hand, not elsewhere classified  Muscle weakness (generalized)    Problem  List Patient Active Problem List   Diagnosis Date Noted  . History of hypothyroidism 07/01/2015  . Anxiety and depression 07/01/2015  . Insomnia 07/01/2015  . Rheumatoid arthritis ([place]) 07/01/2015  . Iron deficiency anemia 07/01/2015  . HLD (hyperlipidemia) 06/12/2015  . Fibromyalgia 06/12/2015    Rosalyn Gess OTR/L,CLT 02/19/2017, 12:25 PM  [place] [facility] [address] [place], [address] Phone: [phone]   Fax:  [phone]  Name: Lanora Reveron MRN: [id number] Date of Birth: [birth year]

## 2017-02-19 NOTE — Patient Instructions (Signed)
Same HEP  

## 2017-02-24 ENCOUNTER — Ambulatory Visit: Payer: Medicare Other | Admitting: Occupational Therapy

## 2017-02-24 DIAGNOSIS — M898X1 Other specified disorders of bone, shoulder: Secondary | ICD-10-CM

## 2017-02-24 DIAGNOSIS — M79641 Pain in right hand: Secondary | ICD-10-CM

## 2017-02-24 DIAGNOSIS — M6281 Muscle weakness (generalized): Secondary | ICD-10-CM

## 2017-02-24 DIAGNOSIS — M25641 Stiffness of right hand, not elsewhere classified: Secondary | ICD-10-CM | POA: Diagnosis not present

## 2017-02-24 DIAGNOSIS — R293 Abnormal posture: Secondary | ICD-10-CM | POA: Diagnosis not present

## 2017-02-24 NOTE — Patient Instructions (Signed)
Same HEP - avoid triggering - built up handles

## 2017-02-24 NOTE — Therapy (Signed)
[place] [facility] [address] [place], [address] Phone: [phone]   Fax:  [phone]  Occupational Therapy Treatment  Patient Details  Name: Audrey Hamilton MRN: [id number] Date of Birth: 11/04/1945 Referring Provider: Meda Coffee   Encounter Date: 02/24/2017  OT End of Session - 02/24/17 1033    Visit Number  6    Number of Visits  12    Date for OT Re-Evaluation  03/10/17    OT Start Time  1015    OT Stop Time  1100    OT Time Calculation (min)  45 min    Activity Tolerance  Patient tolerated treatment well    Behavior During Therapy  [facility] for tasks assessed/performed       Past Medical History:  Diagnosis Date  . Allergy   . Anxiety    controlled  . Arthritis   . Chicken pox   . Colon polyps   . Depression   . Fibromyalgia   . History of hypothyroidism   . Hyperlipidemia   . Hypokalemia   . Multiple gastric ulcers   . Rheumatoid arthritis ([place]) Since 1995   currently in a flare up    Past Surgical History:  Procedure Laterality Date  . ABDOMINAL HYSTERECTOMY  1974  . AUGMENTATION MAMMAPLASTY Bilateral 3825'K   silicone  . EYE SURGERY    . FOOT SURGERY Left 11/22/10  . MASTECTOMY Bilateral 1970's   SUBCUTANEOUS MASTECTOMY - NO CANCER  . SMALL INTESTINE SURGERY  12/2001  . STOMACH SURGERY  12/2001  . TONSILLECTOMY AND ADENOIDECTOMY  1966  . UPPER GASTROINTESTINAL ENDOSCOPY      There were no vitals filed for this visit.  Subjective Assessment - 02/24/17 1029    Subjective   My hands are really so much better- it is just that middle finger on the R that stays stiff - but getting better and tender but getting better     Patient Stated Goals  I want my hands to have less pain  - make better fist with R hand - to open bottles, pick up groceries easier     Currently in Pain?  Yes    Pain Score  2     Pain Location  Finger (Comment which one)    Pain Orientation  Right    Pain Descriptors /  Indicators  Aching    Pain Onset  More than a month ago         [facility] OT Assessment - 02/24/17 0001      Right Hand AROM   R Index  MCP 0-90  85 Degrees      grip in R 22 lbs - was last time 20lbs           OT Treatments/Exercises (OP) - 02/24/17 0001      Iontophoresis   Type of Iontophoresis  Dexamethasone    Location  R 3rd A1pulley    Dose   small patch  and 2.0 current     Time  19      RUE Fluidotherapy   Number Minutes Fluidotherapy  10 Minutes    RUE Fluidotherapy Location  Hand    Comments  at [facility] to increase ROM        Still tenderness over 3rd R A1pulley's And in palm as well as at PIP of 3rd - but improved per pt compare to last time  And report still triggering - did trigger in session one  time - with composite flexion  After fluido show increase ROM and decrease stiffness and pain   Soft tissue mobs for MC spread , joint mobs to MC's and PIP  Graston tool nr 2 - brushing and sweeping in palm , A1pulley's - volar 3rd digit Massage to lateral bands of R PIP's   Joint protection review again and reinforce larger handles- holding   Did skin check prior to ionto -and and pt to keep patch on for about hour when done - no issues prior - still tender - but per pt not as tender as last time  Still decrease ROM of R 3rd - and pain with MC flexion         OT Education - 02/24/17 1033    Education provided  Yes    Education Details  progress- and modify act     Person(s) Educated  Patient    Methods  Explanation;Demonstration;Tactile cues    Comprehension  Verbalized understanding;Returned demonstration;Verbal cues required       OT Short Term Goals - 02/17/17 1519      OT SHORT TERM GOAL #1   Title  Pain on PRWHE improve with 15 points     Baseline  PRWHE pain score at eval 38/50- still pain over 3rd digit - A1pulley     Time  3    Period  Weeks    Status  On-going    Target Date  03/10/17      OT SHORT TERM GOAL #2   Title  Pt to be  ind in HEP to decrease pain and increase AROM in R hand to grasp around broom and knife without increase symptoms     Baseline  pain and ROM /strength improve but still pain in 3rd digit on R and triggering     Time  3    Period  Weeks    Status  On-going    Target Date  03/10/17        OT Long Term Goals - 02/17/17 1520      OT LONG TERM GOAL #1   Title  Pt to show increase AROM in bilateral hands to touch palm without increase pain more than 3/10     Baseline  all digits touching palm except R 3rd digit    Time  3    Period  Weeks    Status  On-going    Target Date  03/10/17      OT LONG TERM GOAL #2   Title  Grip in bilateral hands increase to more than 25 lbs to report increase ability to hold , carry and lift objects more than 5 lbs     Baseline  see flowsheet     Status  Achieved      OT LONG TERM GOAL #3   Title  Pt to verbalize 3 joint protection and AE to use at home to increase independend and ease of doing ADL's and IADL's     Status  Achieved            Plan - 02/24/17 1034    Clinical Impression Statement  Pt Grip and ROM improving in R hand -3rd digit still tender over A1pulley -and triggered one time in session-  pt to cont with HEP - report tenderness getting better- this was her 3rd session of ionto    Occupational performance deficits (Please refer to evaluation for details):  ADL's;IADL's;Play;Leisure    Rehab Potential  Fair  OT Frequency  2x / week    OT Duration  2 weeks    OT Treatment/Interventions  Self-care/ADL training;Fluidtherapy;Splinting;Patient/family education;Contrast Bath;Ultrasound;Therapeutic exercises;Manual Therapy;Parrafin;Iontophoresis    Clinical Decision Making  Limited treatment options, no task modification necessary    OT Home Exercise Plan  see pt instruction       Patient will benefit from skilled therapeutic intervention in order to improve the following deficits and impairments:  Decreased range of motion, Impaired  flexibility, Increased edema, Impaired UE functional use, Pain, Decreased strength, Decreased knowledge of use of DME, Decreased knowledge of precautions  Visit Diagnosis: Pain in right hand  Stiffness of right hand, not elsewhere classified  Muscle weakness (generalized)  Abnormal posture  Pain in scapula    Problem List Patient Active Problem List   Diagnosis Date Noted  . History of hypothyroidism 07/01/2015  . Anxiety and depression 07/01/2015  . Insomnia 07/01/2015  . Rheumatoid arthritis ([place]) 07/01/2015  . Iron deficiency anemia 07/01/2015  . HLD (hyperlipidemia) 06/12/2015  . Fibromyalgia 06/12/2015    Rosalyn Gess OTR/L,CLT 02/24/2017, 1:10 PM  [place] [facility] [address], [place], [address] Phone: [phone]   Fax:  [phone]  Name: Audrey Hamilton MRN: [id number] Date of Birth: 06-23-1945

## 2017-02-26 ENCOUNTER — Ambulatory Visit: Payer: Medicare Other | Admitting: Occupational Therapy

## 2017-02-26 DIAGNOSIS — M6281 Muscle weakness (generalized): Secondary | ICD-10-CM

## 2017-02-26 DIAGNOSIS — M25641 Stiffness of right hand, not elsewhere classified: Secondary | ICD-10-CM

## 2017-02-26 DIAGNOSIS — M898X1 Other specified disorders of bone, shoulder: Secondary | ICD-10-CM | POA: Diagnosis not present

## 2017-02-26 DIAGNOSIS — M79641 Pain in right hand: Secondary | ICD-10-CM

## 2017-02-26 DIAGNOSIS — R293 Abnormal posture: Secondary | ICD-10-CM | POA: Diagnosis not present

## 2017-02-26 NOTE — Patient Instructions (Signed)
Same HEP  

## 2017-02-26 NOTE — Therapy (Signed)
[facility] [address] [place], [address] Phone: [phone]   Fax:  [phone]  Occupational Therapy Treatment  Patient Details  Name: Audrey Hamilton MRN: [id number] Date of Birth: [birth year] Referring Provider: Meda Coffee   Encounter Date: 02/26/2017  OT End of Session - 02/26/17 1433    Visit Number  7    Number of Visits  12    Date for OT Re-Evaluation  03/10/17    OT Start Time  6387    OT Stop Time  1214    OT Time Calculation (min)  43 min    Activity Tolerance  Patient tolerated treatment well    Behavior During Therapy  [facility] for tasks assessed/performed       Past Medical History:  Diagnosis Date  . Allergy   . Anxiety    controlled  . Arthritis   . Chicken pox   . Colon polyps   . Depression   . Fibromyalgia   . History of hypothyroidism   . Hyperlipidemia   . Hypokalemia   . Multiple gastric ulcers   . Rheumatoid arthritis ([place]) Since 1995   currently in a flare up    Past Surgical History:  Procedure Laterality Date  . ABDOMINAL HYSTERECTOMY  1974  . AUGMENTATION MAMMAPLASTY Bilateral 5643'P   silicone  . EYE SURGERY    . FOOT SURGERY Left 11/22/10  . MASTECTOMY Bilateral 1970's   SUBCUTANEOUS MASTECTOMY - NO CANCER  . SMALL INTESTINE SURGERY  12/2001  . STOMACH SURGERY  12/2001  . TONSILLECTOMY AND ADENOIDECTOMY  1966  . UPPER GASTROINTESTINAL ENDOSCOPY      There were no vitals filed for this visit.  Subjective Assessment - 02/26/17 1432    Subjective   This cold weather makes my middle finger more stiff - it do want to trigger still     Patient Stated Goals  I want my hands to have less pain  - make better fist with R hand - to open bottles, pick up groceries easier     Currently in Pain?  No/denies         [facility] OT Assessment - 02/26/17 0001      Right Hand AROM   R Long  MCP 0-90  80 Degrees    R Long PIP 0-100  100 Degrees               OT  Treatments/Exercises (OP) - 02/26/17 0001      Iontophoresis   Type of Iontophoresis  Dexamethasone    Location  R 3rd A1pulley    Dose   small patch  and 2.0 current     Time  19      RUE Fluidotherapy   Number Minutes Fluidotherapy  10 Minutes    RUE Fluidotherapy Location  Hand    Comments  AROM at [facility] to increase 3rd digit AROM        Still tenderness over 3rd R A1pulley's - but improved per pt compare to last time And report still triggering- did trigger in session one time - with composite flexion  After fluido show increase ROM and decrease stiffness and pain  Soft tissue mobs for MC spread , joint mobs to MC's and PIP  Graston tool nr 2 - brushing and sweeping in palm , A1pulley's - volar 3rd digit Massage to lateral bands of R PIP's   Joint protection review again and reinforce larger handles- holding  Did  skin check prior to ionto -and and pt to keep patch on for about hour when done - no issues prior - still tender - but per pt not as tender as last time  Still decrease ROM of R 3rd - and pain with MC flexion       OT Education - 02/26/17 1433    Education provided  Yes    Education Details  progress and HEP     Person(s) Educated  Patient    Methods  Explanation;Demonstration    Comprehension  Verbalized understanding;Returned demonstration       OT Short Term Goals - 02/17/17 1519      OT SHORT TERM GOAL #1   Title  Pain on PRWHE improve with 15 points     Baseline  PRWHE pain score at eval 38/50- still pain over 3rd digit - A1pulley     Time  3    Period  Weeks    Status  On-going    Target Date  03/10/17      OT SHORT TERM GOAL #2   Title  Pt to be ind in HEP to decrease pain and increase AROM in R hand to grasp around broom and knife without increase symptoms     Baseline  pain and ROM /strength improve but still pain in 3rd digit on R and triggering     Time  3    Period  Weeks    Status  On-going    Target Date  03/10/17         OT Long Term Goals - 02/17/17 1520      OT LONG TERM GOAL #1   Title  Pt to show increase AROM in bilateral hands to touch palm without increase pain more than 3/10     Baseline  all digits touching palm except R 3rd digit    Time  3    Period  Weeks    Status  On-going    Target Date  03/10/17      OT LONG TERM GOAL #2   Title  Grip in bilateral hands increase to more than 25 lbs to report increase ability to hold , carry and lift objects more than 5 lbs     Baseline  see flowsheet     Status  Achieved      OT LONG TERM GOAL #3   Title  Pt to verbalize 3 joint protection and AE to use at home to increase independend and ease of doing ADL's and IADL's     Status  Achieved            Plan - 02/26/17 1434    Clinical Impression Statement  Pt AROM and strength improved greatly from [facility] but R 3rd digit still stiff in [facility] - triggering per pt and tender over A1pulley - 4th session of ionto this date - if not better next week - refer back to [facility]     Occupational performance deficits (Please refer to evaluation for details):  ADL's;IADL's;Play;Leisure    Rehab Potential  Fair    OT Frequency  2x / week    OT Duration  2 weeks    OT Treatment/Interventions  Self-care/ADL training;Fluidtherapy;Splinting;Patient/family education;Contrast Bath;Ultrasound;Therapeutic exercises;Manual Therapy;Parrafin;Iontophoresis    Plan  assess progress and ionto to be done     Clinical Decision Making  Limited treatment options, no task modification necessary    OT Home Exercise Plan  see pt instruction    Consulted and  Agree with Plan of Care  Patient       Patient will benefit from skilled therapeutic intervention in order to improve the following deficits and impairments:  Decreased range of motion, Impaired flexibility, Increased edema, Impaired UE functional use, Pain, Decreased strength, Decreased knowledge of use of DME, Decreased knowledge of precautions  Visit Diagnosis: Pain in right  hand  Stiffness of right hand, not elsewhere classified  Muscle weakness (generalized)    Problem List Patient Active Problem List   Diagnosis Date Noted  . History of hypothyroidism 07/01/2015  . Anxiety and depression 07/01/2015  . Insomnia 07/01/2015  . Rheumatoid arthritis ([place]) 07/01/2015  . Iron deficiency anemia 07/01/2015  . HLD (hyperlipidemia) 06/12/2015  . Fibromyalgia 06/12/2015    Rosalyn Gess OTR/L,CLT 02/26/2017, 2:37 PM  [facility] [address] [place], [address] Phone: [phone]   Fax:  [phone]  Name: Audrey Hamilton MRN: [id number] Date of Birth: 10/13/1945

## 2017-03-02 ENCOUNTER — Ambulatory Visit: Payer: Medicare Other | Admitting: Occupational Therapy

## 2017-03-02 DIAGNOSIS — M25641 Stiffness of right hand, not elsewhere classified: Secondary | ICD-10-CM

## 2017-03-02 DIAGNOSIS — M898X1 Other specified disorders of bone, shoulder: Secondary | ICD-10-CM | POA: Diagnosis not present

## 2017-03-02 DIAGNOSIS — M79641 Pain in right hand: Secondary | ICD-10-CM | POA: Diagnosis not present

## 2017-03-02 DIAGNOSIS — M6281 Muscle weakness (generalized): Secondary | ICD-10-CM

## 2017-03-02 DIAGNOSIS — R293 Abnormal posture: Secondary | ICD-10-CM | POA: Diagnosis not present

## 2017-03-02 NOTE — Patient Instructions (Signed)
Same - avoid triggering

## 2017-03-02 NOTE — Therapy (Signed)
[facility] [address] [place], [address] Phone: [phone]   Fax:  [phone]  Occupational Therapy Treatment  Patient Details  Name: Audrey Hamilton MRN: [id number] Date of Birth: 02/05/1946 Referring Provider: Meda Coffee   Encounter Date: 03/02/2017  OT End of Session - 03/02/17 1507    Visit Number  8    Number of Visits  12    Date for OT Re-Evaluation  03/10/17    OT Start Time  1430    OT Stop Time  1504    OT Time Calculation (min)  34 min    Activity Tolerance  Patient tolerated treatment well    Behavior During Therapy  [facility] for tasks assessed/performed       Past Medical History:  Diagnosis Date  . Allergy   . Anxiety    controlled  . Arthritis   . Chicken pox   . Colon polyps   . Depression   . Fibromyalgia   . History of hypothyroidism   . Hyperlipidemia   . Hypokalemia   . Multiple gastric ulcers   . Rheumatoid arthritis ([place]) Since 1995   currently in a flare up    Past Surgical History:  Procedure Laterality Date  . ABDOMINAL HYSTERECTOMY  1974  . AUGMENTATION MAMMAPLASTY Bilateral 8527'P   silicone  . EYE SURGERY    . FOOT SURGERY Left 11/22/10  . MASTECTOMY Bilateral 1970's   SUBCUTANEOUS MASTECTOMY - NO CANCER  . SMALL INTESTINE SURGERY  12/2001  . STOMACH SURGERY  12/2001  . TONSILLECTOMY AND ADENOIDECTOMY  1966  . UPPER GASTROINTESTINAL ENDOSCOPY      There were no vitals filed for this visit.  Subjective Assessment - 03/02/17 1506    Subjective   My middle finger locked one time since I have seen you last - still stiff - others doing great     Patient Stated Goals  I want my hands to have less pain  - make better fist with R hand - to open bottles, pick up groceries easier     Currently in Pain?  No/denies         [facility] OT Assessment - 03/02/17 0001      Right Hand AROM   R Long  MCP 0-90  80 Degrees    R Long PIP 0-100  90 Degrees       Still tender  over 3rd A1pulley - same amount - no triggering with AROM in session          OT Treatments/Exercises (OP) - 03/02/17 0001      Iontophoresis   Type of Iontophoresis  Dexamethasone    Location  R 3rd A1pulley    Dose   small patch  and 2.0 current     Time  19       Skin check done prior  And pt to keep patch on again for about hour afterwards       OT Education - 03/02/17 1507    Education provided  Yes    Education Details  HEP -and need possible shot     Person(s) Educated  Patient    Methods  Explanation;Demonstration;Tactile cues;Verbal cues    Comprehension  Need further instruction       OT Short Term Goals - 02/17/17 1519      OT SHORT TERM GOAL #1   Title  Pain on PRWHE improve with 15 points     Baseline  PRWHE pain score at eval 38/50- still pain over 3rd digit - A1pulley     Time  3    Period  Weeks    Status  On-going    Target Date  03/10/17      OT SHORT TERM GOAL #2   Title  Pt to be ind in HEP to decrease pain and increase AROM in R hand to grasp around broom and knife without increase symptoms     Baseline  pain and ROM /strength improve but still pain in 3rd digit on R and triggering     Time  3    Period  Weeks    Status  On-going    Target Date  03/10/17        OT Long Term Goals - 02/17/17 1520      OT LONG TERM GOAL #1   Title  Pt to show increase AROM in bilateral hands to touch palm without increase pain more than 3/10     Baseline  all digits touching palm except R 3rd digit    Time  3    Period  Weeks    Status  On-going    Target Date  03/10/17      OT LONG TERM GOAL #2   Title  Grip in bilateral hands increase to more than 25 lbs to report increase ability to hold , carry and lift objects more than 5 lbs     Baseline  see flowsheet     Status  Achieved      OT LONG TERM GOAL #3   Title  Pt to verbalize 3 joint protection and AE to use at home to increase independend and ease of doing ADL's and IADL's     Status   Achieved            Plan - 03/02/17 1508    Clinical Impression Statement  Pt still tender over A1pulley of L 3rd - ROM still decrease at 3rd [facility] and PIP - phoned Dr Meda Coffee  for appt - for possible shot - [place] - will keep one more session with pt - and nurse will phone pt about appt time     Occupational performance deficits (Please refer to evaluation for details):  ADL's;IADL's;Play;Leisure    Rehab Potential  Fair    OT Frequency  2x / week    OT Duration  2 weeks    OT Treatment/Interventions  Self-care/ADL training;Fluidtherapy;Splinting;Patient/family education;Contrast Bath;Ultrasound;Therapeutic exercises;Manual Therapy;Parrafin;Iontophoresis    Plan  ionto with dexamethazone     Clinical Decision Making  Limited treatment options, no task modification necessary    OT Home Exercise Plan  see pt instruction    Consulted and Agree with Plan of Care  Patient       Patient will benefit from skilled therapeutic intervention in order to improve the following deficits and impairments:  Decreased range of motion, Impaired flexibility, Increased edema, Impaired UE functional use, Pain, Decreased strength, Decreased knowledge of use of DME, Decreased knowledge of precautions  Visit Diagnosis: Pain in right hand  Stiffness of right hand, not elsewhere classified  Muscle weakness (generalized)    Problem List Patient Active Problem List   Diagnosis Date Noted  . History of hypothyroidism 07/01/2015  . Anxiety and depression 07/01/2015  . Insomnia 07/01/2015  . Rheumatoid arthritis ([place]) 07/01/2015  . Iron deficiency anemia 07/01/2015  . HLD (hyperlipidemia) 06/12/2015  . Fibromyalgia 06/12/2015    Rosalyn Gess OTR/L,CLT  03/02/2017, 3:10 PM  [facility] [address], [place], [address] Phone: [phone]   Fax:  [phone]  Name: Audrey Hamilton MRN: [id number] Date of  Birth: 06-04-1945

## 2017-03-05 ENCOUNTER — Ambulatory Visit: Payer: Medicare Other | Admitting: Occupational Therapy

## 2017-03-05 DIAGNOSIS — M25641 Stiffness of right hand, not elsewhere classified: Secondary | ICD-10-CM

## 2017-03-05 DIAGNOSIS — M898X1 Other specified disorders of bone, shoulder: Secondary | ICD-10-CM | POA: Diagnosis not present

## 2017-03-05 DIAGNOSIS — M6281 Muscle weakness (generalized): Secondary | ICD-10-CM

## 2017-03-05 DIAGNOSIS — M79641 Pain in right hand: Secondary | ICD-10-CM

## 2017-03-05 DIAGNOSIS — R293 Abnormal posture: Secondary | ICD-10-CM | POA: Diagnosis not present

## 2017-03-05 NOTE — Therapy (Signed)
[facility] [address], [place], [address] Phone: [phone]   Fax:  [phone]  Occupational Therapy Treatment  Patient Details  Name: Audrey Hamilton MRN: [id number] Date of Birth: 04/04/1946 Referring Provider: Meda Coffee   Encounter Date: 03/05/2017  OT End of Session - 03/05/17 1224    Visit Number  9    Number of Visits  12    Date for OT Re-Evaluation  03/10/17    OT Start Time  1140    OT Stop Time  1220    OT Time Calculation (min)  40 min    Activity Tolerance  Patient tolerated treatment well    Behavior During Therapy  [facility] for tasks assessed/performed       Past Medical History:  Diagnosis Date  . Allergy   . Anxiety    controlled  . Arthritis   . Chicken pox   . Colon polyps   . Depression   . Fibromyalgia   . History of hypothyroidism   . Hyperlipidemia   . Hypokalemia   . Multiple gastric ulcers   . Rheumatoid arthritis ([place]) Since 1995   currently in a flare up    Past Surgical History:  Procedure Laterality Date  . ABDOMINAL HYSTERECTOMY  1974  . AUGMENTATION MAMMAPLASTY Bilateral 5053'Z   silicone  . EYE SURGERY    . FOOT SURGERY Left 11/22/10  . MASTECTOMY Bilateral 1970's   SUBCUTANEOUS MASTECTOMY - NO CANCER  . SMALL INTESTINE SURGERY  12/2001  . STOMACH SURGERY  12/2001  . TONSILLECTOMY AND ADENOIDECTOMY  1966  . UPPER GASTROINTESTINAL ENDOSCOPY      There were no vitals filed for this visit.  Subjective Assessment - 03/05/17 1213    Subjective   My R finger stays stiff and tender in palm - I don't grip anything tight - did not get call from Dr Meda Coffee office     Patient Stated Goals  I want my hands to have less pain  - make better fist with R hand - to open bottles, pick up groceries easier     Currently in Pain?  Yes    Pain Score  2     Pain Location  Finger (Comment which one)    Pain Orientation  Right    Pain Descriptors / Indicators  Aching        Measured grip and prhension - and ROM in digits   [facility] OT Assessment - 03/05/17 0001      Strength   Right Hand Grip (lbs)  21    Right Hand Lateral Pinch  10 lbs    Right Hand 3 Point Pinch  7 lbs    Left Hand Grip (lbs)  31    Left Hand Lateral Pinch  9 lbs    Left Hand 3 Point Pinch  7 lbs       Soft tissue mobs done to volar 3rd digit and palm - using Graston tool nr 2 for brushing and sweeping  - tender over R 3rd A1pulley   PRWHE done for pain 14/50 and function 5/50          OT Treatments/Exercises (OP) - 03/05/17 0001      Iontophoresis   Type of Iontophoresis  Dexamethasone    Location  R 3rd A1pulley    Dose   small patch  and 2.0 current     Time  19  OT Education - 03/05/17 1223    Education provided  Yes    Education Details  pt to see MD for possible shot - to phone me about week later     Person(s) Educated  Patient    Methods  Explanation;Demonstration;Tactile cues    Comprehension  Verbalized understanding       OT Short Term Goals - 03/05/17 1227      OT SHORT TERM GOAL #1   Title  Pain on PRWHE improve with 15 points     Baseline  PRWHE pain score at eval 38/50- and now 14/50 - still trigger finger over 3rd on R hand     Time  3    Period  Weeks    Status  On-going    Target Date  03/10/17      OT SHORT TERM GOAL #2   Title  Pt to be ind in HEP to decrease pain and increase AROM in R hand to grasp around broom and knife without increase symptoms     Status  Achieved        OT Long Term Goals - 03/05/17 1228      OT LONG TERM GOAL #1   Title  Pt to show increase AROM in bilateral hands to touch palm without increase pain more than 3/10     Baseline  great progress - except R 3rd digit - stiff and tender over A1pulley     Time  3    Period  Weeks    Status  On-going    Target Date  03/10/17      OT LONG TERM GOAL #2   Title  Grip in bilateral hands increase to more than 25 lbs to report increase ability to  hold , carry and lift objects more than 5 lbs     Baseline  great progress- see flowsheet     Period  Weeks    Status  Achieved      OT LONG TERM GOAL #3   Title  Pt to verbalize 3 joint protection and AE to use at home to increase independend and ease of doing ADL's and IADL's     Status  Achieved            Plan - 03/05/17 1225    Clinical Impression Statement  Pt made great progress in pain , AROM and grip /prehension strength in bilateral hands - but cont to have trigger finger on R 3rd - not responding to ionto with dexamethazone - refer back to Dr Meda Coffee - pt awaiting call  - pt to contact me again after appt with Dr Meda Coffee     Occupational performance deficits (Please refer to evaluation for details):  ADL's;IADL's;Play;Leisure    Rehab Potential  Fair    OT Frequency  -- to see MD first    OT Treatment/Interventions  Self-care/ADL training;Fluidtherapy;Splinting;Patient/family education;Contrast Bath;Ultrasound;Therapeutic exercises;Manual Therapy;Parrafin;Iontophoresis    Plan  await appt with MD     Clinical Decision Making  Limited treatment options, no task modification necessary    OT Home Exercise Plan  see pt instruction    Consulted and Agree with Plan of Care  Patient       Patient will benefit from skilled therapeutic intervention in order to improve the following deficits and impairments:  Decreased range of motion, Impaired flexibility, Increased edema, Impaired UE functional use, Pain, Decreased strength, Decreased knowledge of use of DME, Decreased knowledge of precautions  Visit Diagnosis: Pain in  right hand  Stiffness of right hand, not elsewhere classified  Muscle weakness (generalized)    Problem List Patient Active Problem List   Diagnosis Date Noted  . History of hypothyroidism 07/01/2015  . Anxiety and depression 07/01/2015  . Insomnia 07/01/2015  . Rheumatoid arthritis ([place]) 07/01/2015  . Iron deficiency anemia 07/01/2015  . HLD  (hyperlipidemia) 06/12/2015  . Fibromyalgia 06/12/2015    Rosalyn Gess OTR/L,CLT 03/05/2017, 12:29 PM  [facility] [address] [place], [address] Phone: [phone]   Fax:  [phone]  Name: Audrey Hamilton MRN: [id number] Date of Birth: [birth year]

## 2017-03-05 NOTE — Patient Instructions (Signed)
Same - and waiting for appt with Dr Meda Coffee

## 2017-03-25 ENCOUNTER — Other Ambulatory Visit: Payer: Self-pay | Admitting: Family Medicine

## 2017-03-26 NOTE — Telephone Encounter (Signed)
Please contact the patient to see if she has continued to take this. There was no mention of it in her last office visit note. Thanks.

## 2017-03-26 NOTE — Telephone Encounter (Signed)
Patient states she takes this once nightly

## 2017-03-26 NOTE — Telephone Encounter (Signed)
faxed

## 2017-03-26 NOTE — Telephone Encounter (Signed)
Last office visit 10/01/16 to follow up 6 months  Next office visit 04/05/17 Last filled 12/24/16

## 2017-03-30 DIAGNOSIS — M65331 Trigger finger, right middle finger: Secondary | ICD-10-CM | POA: Diagnosis not present

## 2017-03-30 DIAGNOSIS — M19041 Primary osteoarthritis, right hand: Secondary | ICD-10-CM | POA: Diagnosis not present

## 2017-03-30 DIAGNOSIS — Z79899 Other long term (current) drug therapy: Secondary | ICD-10-CM | POA: Diagnosis not present

## 2017-03-30 DIAGNOSIS — M170 Bilateral primary osteoarthritis of knee: Secondary | ICD-10-CM | POA: Diagnosis not present

## 2017-03-30 DIAGNOSIS — M0609 Rheumatoid arthritis without rheumatoid factor, multiple sites: Secondary | ICD-10-CM | POA: Diagnosis not present

## 2017-04-02 ENCOUNTER — Ambulatory Visit: Payer: Medicare Other | Admitting: Family Medicine

## 2017-04-05 ENCOUNTER — Encounter: Payer: Self-pay | Admitting: Family Medicine

## 2017-04-05 ENCOUNTER — Ambulatory Visit (INDEPENDENT_AMBULATORY_CARE_PROVIDER_SITE_OTHER): Payer: Medicare Other

## 2017-04-05 ENCOUNTER — Other Ambulatory Visit: Payer: Self-pay

## 2017-04-05 ENCOUNTER — Telehealth: Payer: Self-pay | Admitting: Family Medicine

## 2017-04-05 ENCOUNTER — Ambulatory Visit (INDEPENDENT_AMBULATORY_CARE_PROVIDER_SITE_OTHER): Payer: Medicare Other | Admitting: Family Medicine

## 2017-04-05 VITALS — BP 150/90 | HR 75 | Temp 98.2°F | Wt 155.2 lb

## 2017-04-05 DIAGNOSIS — M069 Rheumatoid arthritis, unspecified: Secondary | ICD-10-CM | POA: Diagnosis not present

## 2017-04-05 DIAGNOSIS — R030 Elevated blood-pressure reading, without diagnosis of hypertension: Secondary | ICD-10-CM | POA: Diagnosis not present

## 2017-04-05 DIAGNOSIS — M5412 Radiculopathy, cervical region: Secondary | ICD-10-CM

## 2017-04-05 DIAGNOSIS — F419 Anxiety disorder, unspecified: Secondary | ICD-10-CM

## 2017-04-05 DIAGNOSIS — F329 Major depressive disorder, single episode, unspecified: Secondary | ICD-10-CM

## 2017-04-05 DIAGNOSIS — F32A Depression, unspecified: Secondary | ICD-10-CM

## 2017-04-05 DIAGNOSIS — M4722 Other spondylosis with radiculopathy, cervical region: Secondary | ICD-10-CM | POA: Diagnosis not present

## 2017-04-05 MED ORDER — BACLOFEN 10 MG PO TABS: 5.0000 mg | ORAL_TABLET | Freq: Three times a day (TID) | ORAL | 0 refills | Status: DC | PRN

## 2017-04-05 MED ORDER — PREDNISONE 10 MG PO TABS: ORAL_TABLET | ORAL | 0 refills | Status: DC

## 2017-04-05 NOTE — Assessment & Plan Note (Signed)
Doing well.  She will continue tramadol.  Continue to follow with rheumatology.

## 2017-04-05 NOTE — Telephone Encounter (Signed)
Please let the patient know that I sent her a new prescription for prednisone in to taper on for her discomfort.  Given that the discard date was in October of this year she should not take the prednisone that she has currently and should discard it.  Thanks.

## 2017-04-05 NOTE — Assessment & Plan Note (Addendum)
Arm and neck discomfort most consistent with cervical radiculopathy.  We will obtain an x-ray of her neck today.  Likely will need to consider MRI and physical therapy potentially.  She reports she has prednisone left over at home and she will contact us with the dose she has and how many tablets she has left.  I have asked her to confirm that it is not expired as well.  Will provide directions once we know this information.  If expired will provide her with a new prescription.  Baclofen as a muscle relaxer.  Advised that this could make her drowsy and that she should not take this with her Ativan.

## 2017-04-05 NOTE — Assessment & Plan Note (Signed)
Doing well.  Continue Ativan.

## 2017-04-05 NOTE — Assessment & Plan Note (Signed)
Elevated today.  Suspect this is related to her discomfort.  She has not been checking at home.  Discussed checking this at home for 1 week and contacting us with the results.  If consistently elevated she will contact us sooner.  May need to consider adding medication if above goal of 140/90.

## 2017-04-05 NOTE — Telephone Encounter (Signed)
Left message to return call, ok for pec to speak to patient to notify her Dr. Caryl Bis sent a new rx for prednisone

## 2017-04-05 NOTE — Telephone Encounter (Signed)
Please advise 

## 2017-04-05 NOTE — Telephone Encounter (Signed)
Copied from [place] [id number]. Topic: Quick Communication - See Telephone Encounter >> Apr 05, 2017 12:01 PM Vernona Rieger wrote: CRM for notification. See Telephone encounter for:   04/05/17.  Pt said she just left the office and Dr Biagio Quint wanted her to call and let him know that the prednisone that she has is 5 mg DATE was filled 01/28/16 & disregard is 01/27/17.

## 2017-04-05 NOTE — Patient Instructions (Signed)
Nice to see you. Please call us with the dose of the prednisone that you have at home. We will contact you with your neck x-ray results. Please have your daughter start checking your blood pressure and please contact us in 1 week with your readings.

## 2017-04-05 NOTE — Progress Notes (Signed)
Audrey Rumps, MD Phone: [phone]  Audrey Hamilton is a 71 y.o. female who presents today for follow-up.  Anxiety/depression: Patient tapered off of Lexapro as she was not depressed.  This was done under the direction of Dr. Lacinda Axon.  She notes no depressive symptoms.  Notes occasional anxiety.  Takes Ativan once at night to help with this.  Rheumatoid arthritis: She has been following with rheumatology.  She has done quite well on [place].  Takes tramadol twice a day for pain.  This does not make her drowsy.  She reports right arm pain that radiates down from her upper scapula that has been going on for a week and a half.  Notes that the stabbing discomfort that shoots down her arm.  Holding her arm in a certain position and bending it hurts.  Started after working on lights at home.  Has intermittently used lidocaine patches for it.  Slight numbness over her right lateral elbow though no other numbness.  No weakness.  Elevated blood pressure: No history of hypertension.  Does not check her blood pressure at home.  No chest pain or shortness of breath.  Social History   Tobacco Use  Smoking Status Former Smoker  . Last attempt to quit: 06/19/1995  . Years since quitting: 21.8  Smokeless Tobacco Never Used     ROS see history of present illness  Objective  Physical Exam Vitals:   04/05/17 1058  BP: (!) 150/90  Pulse: 75  Temp: 98.2 F (36.8 C)  SpO2: 96%    BP Readings from Last 3 Encounters:  04/05/17 (!) 150/90  10/01/16 (!) 158/92  04/01/16 113/71   Wt Readings from Last 3 Encounters:  04/05/17 155 lb 3.2 oz (70.4 kg)  10/01/16 145 lb 8 oz (66 kg)  04/01/16 136 lb 2 oz (61.7 kg)    Physical Exam  Constitutional: No distress.  Cardiovascular: Normal rate, regular rhythm and normal heart sounds.  Pulmonary/Chest: Effort normal and breath sounds normal.  Musculoskeletal: She exhibits no edema.  No midline neck tenderness, no no midline neck step-off, there is  rhomboid/trapezius area tenderness adjacent to her right superior scapula  Neurological: She is alert. Gait normal.  5/5 strength bilateral biceps, triceps, hand grip, sensation to light touch slightly decreased over the lateral aspect of her right elbow, otherwise intact in bilateral upper extremities  Skin: Skin is warm and dry. She is not diaphoretic.     Assessment/Plan: Please see individual problem list.  Anxiety and depression Doing well.  Continue Ativan.  Rheumatoid arthritis ([place]) Doing well.  She will continue tramadol.  Continue to follow with rheumatology.  Cervical radiculopathy Arm and neck discomfort most consistent with cervical radiculopathy.  We will obtain an x-ray of her neck today.  Likely will need to consider MRI and physical therapy potentially.  She reports she has prednisone left over at home and she will contact us with the dose she has and how many tablets she has left.  I have asked her to confirm that it is not expired as well.  Will provide directions once we know this information.  If expired will provide her with a new prescription.  Baclofen as a muscle relaxer.  Advised that this could make her drowsy and that she should not take this with her Ativan.  Elevated blood pressure reading Elevated today.  Suspect this is related to her discomfort.  She has not been checking at home.  Discussed checking this at home for 1 week and  contacting us with the results.  If consistently elevated she will contact us sooner.  May need to consider adding medication if above goal of 140/90.   [name] was seen today for establish care.  Diagnoses and all orders for this visit:  Cervical radiculopathy -     DG Cervical Spine Complete; Future  Anxiety and depression  Rheumatoid arthritis involving multiple sites, unspecified rheumatoid factor presence (HCC)  Elevated blood pressure reading  Other orders -     baclofen (LIORESAL) 10 MG tablet; Take 0.5 tablets (5 mg  total) by mouth 3 (three) times daily as needed for muscle spasms.    Orders Placed This Encounter  Procedures  . DG Cervical Spine Complete    Standing Status:   Future    Number of Occurrences:   1    Standing Expiration Date:   06/06/2018    Order Specific Question:   Reason for Exam (SYMPTOM  OR DIAGNOSIS REQUIRED)    Answer:   radiculopathy left arm    Order Specific Question:   Preferred imaging location?    Answer:   [facility] Specific Question:   Radiology Contrast Protocol - do NOT remove file path    Answer:   [email or web address]    Meds ordered this encounter  Medications  . baclofen (LIORESAL) 10 MG tablet    Sig: Take 0.5 tablets (5 mg total) by mouth 3 (three) times daily as needed for muscle spasms.    Dispense:  10 each    Refill:  0     Audrey Rumps, MD [place]

## 2017-04-06 ENCOUNTER — Encounter: Payer: Self-pay | Admitting: *Deleted

## 2017-04-06 NOTE — Telephone Encounter (Signed)
This encounter was created in error - please disregard.

## 2017-04-06 NOTE — Telephone Encounter (Signed)
Called pt and she has already picked up rx of prednisone.

## 2017-04-16 DIAGNOSIS — M0609 Rheumatoid arthritis without rheumatoid factor, multiple sites: Secondary | ICD-10-CM | POA: Diagnosis not present

## 2017-04-16 DIAGNOSIS — Z79899 Other long term (current) drug therapy: Secondary | ICD-10-CM | POA: Diagnosis not present

## 2017-04-22 ENCOUNTER — Other Ambulatory Visit: Payer: Self-pay | Admitting: Family Medicine

## 2017-04-23 ENCOUNTER — Telehealth: Payer: Self-pay

## 2017-04-23 DIAGNOSIS — M9902 Segmental and somatic dysfunction of thoracic region: Secondary | ICD-10-CM | POA: Diagnosis not present

## 2017-04-23 DIAGNOSIS — M9901 Segmental and somatic dysfunction of cervical region: Secondary | ICD-10-CM | POA: Diagnosis not present

## 2017-04-23 DIAGNOSIS — M5033 Other cervical disc degeneration, cervicothoracic region: Secondary | ICD-10-CM | POA: Diagnosis not present

## 2017-04-23 DIAGNOSIS — M62830 Muscle spasm of back: Secondary | ICD-10-CM | POA: Diagnosis not present

## 2017-04-23 NOTE — Telephone Encounter (Signed)
Left message to return call to ask patient if she needs a cd of her xray or just the result, also I have been unable to infrom patient of xray results. [place] for [employer] to speak with patient and give xray results

## 2017-04-23 NOTE — Telephone Encounter (Signed)
Phoned in.

## 2017-04-23 NOTE — Telephone Encounter (Signed)
Last OV 04/05/17 last filled 01/25/17 90 1rf

## 2017-04-23 NOTE — Telephone Encounter (Signed)
Copied from [place]. Topic: Inquiry >> Apr 23, 2017  8:11 AM Cecelia Byars, NT wrote: Reason for CRM: Patient called said she is going to see  Dr Francisca December at [address] in [place] , and he needs a copy of her recent x ray please call him [phone] she has an appointment today 10:15.

## 2017-04-26 DIAGNOSIS — M9902 Segmental and somatic dysfunction of thoracic region: Secondary | ICD-10-CM | POA: Diagnosis not present

## 2017-04-26 DIAGNOSIS — M5033 Other cervical disc degeneration, cervicothoracic region: Secondary | ICD-10-CM | POA: Diagnosis not present

## 2017-04-26 DIAGNOSIS — M9901 Segmental and somatic dysfunction of cervical region: Secondary | ICD-10-CM | POA: Diagnosis not present

## 2017-04-26 DIAGNOSIS — M62830 Muscle spasm of back: Secondary | ICD-10-CM | POA: Diagnosis not present

## 2017-04-26 NOTE — Telephone Encounter (Signed)
Patient has not returned call

## 2017-04-29 DIAGNOSIS — M5033 Other cervical disc degeneration, cervicothoracic region: Secondary | ICD-10-CM | POA: Diagnosis not present

## 2017-04-29 DIAGNOSIS — M62830 Muscle spasm of back: Secondary | ICD-10-CM | POA: Diagnosis not present

## 2017-04-29 DIAGNOSIS — M9901 Segmental and somatic dysfunction of cervical region: Secondary | ICD-10-CM | POA: Diagnosis not present

## 2017-04-29 DIAGNOSIS — M9902 Segmental and somatic dysfunction of thoracic region: Secondary | ICD-10-CM | POA: Diagnosis not present

## 2017-04-30 DIAGNOSIS — M5033 Other cervical disc degeneration, cervicothoracic region: Secondary | ICD-10-CM | POA: Diagnosis not present

## 2017-04-30 DIAGNOSIS — M62830 Muscle spasm of back: Secondary | ICD-10-CM | POA: Diagnosis not present

## 2017-04-30 DIAGNOSIS — M9901 Segmental and somatic dysfunction of cervical region: Secondary | ICD-10-CM | POA: Diagnosis not present

## 2017-04-30 DIAGNOSIS — M9902 Segmental and somatic dysfunction of thoracic region: Secondary | ICD-10-CM | POA: Diagnosis not present

## 2017-05-03 DIAGNOSIS — M9901 Segmental and somatic dysfunction of cervical region: Secondary | ICD-10-CM | POA: Diagnosis not present

## 2017-05-03 DIAGNOSIS — M5033 Other cervical disc degeneration, cervicothoracic region: Secondary | ICD-10-CM | POA: Diagnosis not present

## 2017-05-03 DIAGNOSIS — M62830 Muscle spasm of back: Secondary | ICD-10-CM | POA: Diagnosis not present

## 2017-05-03 DIAGNOSIS — M9902 Segmental and somatic dysfunction of thoracic region: Secondary | ICD-10-CM | POA: Diagnosis not present

## 2017-05-05 DIAGNOSIS — M62830 Muscle spasm of back: Secondary | ICD-10-CM | POA: Diagnosis not present

## 2017-05-05 DIAGNOSIS — M5033 Other cervical disc degeneration, cervicothoracic region: Secondary | ICD-10-CM | POA: Diagnosis not present

## 2017-05-05 DIAGNOSIS — M9901 Segmental and somatic dysfunction of cervical region: Secondary | ICD-10-CM | POA: Diagnosis not present

## 2017-05-05 DIAGNOSIS — M9902 Segmental and somatic dysfunction of thoracic region: Secondary | ICD-10-CM | POA: Diagnosis not present

## 2017-05-07 DIAGNOSIS — M9902 Segmental and somatic dysfunction of thoracic region: Secondary | ICD-10-CM | POA: Diagnosis not present

## 2017-05-07 DIAGNOSIS — M5033 Other cervical disc degeneration, cervicothoracic region: Secondary | ICD-10-CM | POA: Diagnosis not present

## 2017-05-07 DIAGNOSIS — M62830 Muscle spasm of back: Secondary | ICD-10-CM | POA: Diagnosis not present

## 2017-05-07 DIAGNOSIS — M9901 Segmental and somatic dysfunction of cervical region: Secondary | ICD-10-CM | POA: Diagnosis not present

## 2017-05-10 DIAGNOSIS — M9901 Segmental and somatic dysfunction of cervical region: Secondary | ICD-10-CM | POA: Diagnosis not present

## 2017-05-10 DIAGNOSIS — M5033 Other cervical disc degeneration, cervicothoracic region: Secondary | ICD-10-CM | POA: Diagnosis not present

## 2017-05-10 DIAGNOSIS — M62830 Muscle spasm of back: Secondary | ICD-10-CM | POA: Diagnosis not present

## 2017-05-10 DIAGNOSIS — M9902 Segmental and somatic dysfunction of thoracic region: Secondary | ICD-10-CM | POA: Diagnosis not present

## 2017-05-14 ENCOUNTER — Other Ambulatory Visit: Payer: Self-pay

## 2017-05-14 DIAGNOSIS — M9901 Segmental and somatic dysfunction of cervical region: Secondary | ICD-10-CM | POA: Diagnosis not present

## 2017-05-14 DIAGNOSIS — M5033 Other cervical disc degeneration, cervicothoracic region: Secondary | ICD-10-CM | POA: Diagnosis not present

## 2017-05-14 DIAGNOSIS — M9902 Segmental and somatic dysfunction of thoracic region: Secondary | ICD-10-CM | POA: Diagnosis not present

## 2017-05-14 DIAGNOSIS — M62830 Muscle spasm of back: Secondary | ICD-10-CM | POA: Diagnosis not present

## 2017-05-14 MED ORDER — MONTELUKAST SODIUM 10 MG PO TABS: 10.0000 mg | ORAL_TABLET | Freq: Every day | ORAL | 0 refills | Status: DC

## 2017-05-17 DIAGNOSIS — M9902 Segmental and somatic dysfunction of thoracic region: Secondary | ICD-10-CM | POA: Diagnosis not present

## 2017-05-17 DIAGNOSIS — M9901 Segmental and somatic dysfunction of cervical region: Secondary | ICD-10-CM | POA: Diagnosis not present

## 2017-05-17 DIAGNOSIS — M5033 Other cervical disc degeneration, cervicothoracic region: Secondary | ICD-10-CM | POA: Diagnosis not present

## 2017-05-17 DIAGNOSIS — M62830 Muscle spasm of back: Secondary | ICD-10-CM | POA: Diagnosis not present

## 2017-05-21 DIAGNOSIS — M9902 Segmental and somatic dysfunction of thoracic region: Secondary | ICD-10-CM | POA: Diagnosis not present

## 2017-05-21 DIAGNOSIS — M9901 Segmental and somatic dysfunction of cervical region: Secondary | ICD-10-CM | POA: Diagnosis not present

## 2017-05-21 DIAGNOSIS — M62830 Muscle spasm of back: Secondary | ICD-10-CM | POA: Diagnosis not present

## 2017-05-21 DIAGNOSIS — M5033 Other cervical disc degeneration, cervicothoracic region: Secondary | ICD-10-CM | POA: Diagnosis not present

## 2017-05-24 DIAGNOSIS — M9902 Segmental and somatic dysfunction of thoracic region: Secondary | ICD-10-CM | POA: Diagnosis not present

## 2017-05-24 DIAGNOSIS — M62830 Muscle spasm of back: Secondary | ICD-10-CM | POA: Diagnosis not present

## 2017-05-24 DIAGNOSIS — M9901 Segmental and somatic dysfunction of cervical region: Secondary | ICD-10-CM | POA: Diagnosis not present

## 2017-05-24 DIAGNOSIS — M5033 Other cervical disc degeneration, cervicothoracic region: Secondary | ICD-10-CM | POA: Diagnosis not present

## 2017-05-26 DIAGNOSIS — M5033 Other cervical disc degeneration, cervicothoracic region: Secondary | ICD-10-CM | POA: Diagnosis not present

## 2017-05-26 DIAGNOSIS — M62830 Muscle spasm of back: Secondary | ICD-10-CM | POA: Diagnosis not present

## 2017-05-26 DIAGNOSIS — M9901 Segmental and somatic dysfunction of cervical region: Secondary | ICD-10-CM | POA: Diagnosis not present

## 2017-05-26 DIAGNOSIS — M9902 Segmental and somatic dysfunction of thoracic region: Secondary | ICD-10-CM | POA: Diagnosis not present

## 2017-05-28 DIAGNOSIS — M9901 Segmental and somatic dysfunction of cervical region: Secondary | ICD-10-CM | POA: Diagnosis not present

## 2017-05-28 DIAGNOSIS — M62830 Muscle spasm of back: Secondary | ICD-10-CM | POA: Diagnosis not present

## 2017-05-28 DIAGNOSIS — M9902 Segmental and somatic dysfunction of thoracic region: Secondary | ICD-10-CM | POA: Diagnosis not present

## 2017-05-28 DIAGNOSIS — M5033 Other cervical disc degeneration, cervicothoracic region: Secondary | ICD-10-CM | POA: Diagnosis not present

## 2017-06-01 DIAGNOSIS — M5033 Other cervical disc degeneration, cervicothoracic region: Secondary | ICD-10-CM | POA: Diagnosis not present

## 2017-06-01 DIAGNOSIS — M9902 Segmental and somatic dysfunction of thoracic region: Secondary | ICD-10-CM | POA: Diagnosis not present

## 2017-06-01 DIAGNOSIS — M62830 Muscle spasm of back: Secondary | ICD-10-CM | POA: Diagnosis not present

## 2017-06-01 DIAGNOSIS — M9901 Segmental and somatic dysfunction of cervical region: Secondary | ICD-10-CM | POA: Diagnosis not present

## 2017-06-04 DIAGNOSIS — M9902 Segmental and somatic dysfunction of thoracic region: Secondary | ICD-10-CM | POA: Diagnosis not present

## 2017-06-04 DIAGNOSIS — M9901 Segmental and somatic dysfunction of cervical region: Secondary | ICD-10-CM | POA: Diagnosis not present

## 2017-06-04 DIAGNOSIS — M62830 Muscle spasm of back: Secondary | ICD-10-CM | POA: Diagnosis not present

## 2017-06-04 DIAGNOSIS — M5033 Other cervical disc degeneration, cervicothoracic region: Secondary | ICD-10-CM | POA: Diagnosis not present

## 2017-06-08 DIAGNOSIS — M9901 Segmental and somatic dysfunction of cervical region: Secondary | ICD-10-CM | POA: Diagnosis not present

## 2017-06-08 DIAGNOSIS — M9902 Segmental and somatic dysfunction of thoracic region: Secondary | ICD-10-CM | POA: Diagnosis not present

## 2017-06-08 DIAGNOSIS — M62830 Muscle spasm of back: Secondary | ICD-10-CM | POA: Diagnosis not present

## 2017-06-08 DIAGNOSIS — M5033 Other cervical disc degeneration, cervicothoracic region: Secondary | ICD-10-CM | POA: Diagnosis not present

## 2017-06-10 DIAGNOSIS — M9902 Segmental and somatic dysfunction of thoracic region: Secondary | ICD-10-CM | POA: Diagnosis not present

## 2017-06-10 DIAGNOSIS — M5033 Other cervical disc degeneration, cervicothoracic region: Secondary | ICD-10-CM | POA: Diagnosis not present

## 2017-06-10 DIAGNOSIS — M9901 Segmental and somatic dysfunction of cervical region: Secondary | ICD-10-CM | POA: Diagnosis not present

## 2017-06-10 DIAGNOSIS — M62830 Muscle spasm of back: Secondary | ICD-10-CM | POA: Diagnosis not present

## 2017-06-15 ENCOUNTER — Ambulatory Visit: Payer: Medicare Other | Admitting: Family Medicine

## 2017-06-15 DIAGNOSIS — H5203 Hypermetropia, bilateral: Secondary | ICD-10-CM | POA: Diagnosis not present

## 2017-06-15 DIAGNOSIS — H52223 Regular astigmatism, bilateral: Secondary | ICD-10-CM | POA: Diagnosis not present

## 2017-06-15 DIAGNOSIS — Z961 Presence of intraocular lens: Secondary | ICD-10-CM | POA: Diagnosis not present

## 2017-06-15 DIAGNOSIS — H43811 Vitreous degeneration, right eye: Secondary | ICD-10-CM | POA: Diagnosis not present

## 2017-06-15 DIAGNOSIS — H524 Presbyopia: Secondary | ICD-10-CM | POA: Diagnosis not present

## 2017-06-17 DIAGNOSIS — M9901 Segmental and somatic dysfunction of cervical region: Secondary | ICD-10-CM | POA: Diagnosis not present

## 2017-06-17 DIAGNOSIS — M62830 Muscle spasm of back: Secondary | ICD-10-CM | POA: Diagnosis not present

## 2017-06-17 DIAGNOSIS — M9902 Segmental and somatic dysfunction of thoracic region: Secondary | ICD-10-CM | POA: Diagnosis not present

## 2017-06-17 DIAGNOSIS — M5033 Other cervical disc degeneration, cervicothoracic region: Secondary | ICD-10-CM | POA: Diagnosis not present

## 2017-06-23 ENCOUNTER — Telehealth: Payer: Self-pay | Admitting: Family Medicine

## 2017-06-23 NOTE — Telephone Encounter (Signed)
Last OV 04/05/17 last filled 03/26/17 90 0rf

## 2017-06-23 NOTE — Telephone Encounter (Signed)
Sent to pharmacy 

## 2017-06-28 ENCOUNTER — Other Ambulatory Visit: Payer: Self-pay | Admitting: Family Medicine

## 2017-06-29 NOTE — Telephone Encounter (Signed)
Last OV 04/05/17 last filled 06/23/17 refill too soon

## 2017-06-30 DIAGNOSIS — M5033 Other cervical disc degeneration, cervicothoracic region: Secondary | ICD-10-CM | POA: Diagnosis not present

## 2017-06-30 DIAGNOSIS — M9901 Segmental and somatic dysfunction of cervical region: Secondary | ICD-10-CM | POA: Diagnosis not present

## 2017-06-30 DIAGNOSIS — M62830 Muscle spasm of back: Secondary | ICD-10-CM | POA: Diagnosis not present

## 2017-06-30 DIAGNOSIS — M9902 Segmental and somatic dysfunction of thoracic region: Secondary | ICD-10-CM | POA: Diagnosis not present

## 2017-06-30 NOTE — Telephone Encounter (Unsigned)
Copied from [place] [id number]. Topic: Quick Communication - Rx Refill/Question >> Jun 30, 2017 10:19 AM Neva Seat wrote: Lorazepam ?  Pt is out.  Pt had a March appt that was rescheduled to April. Pharmacy sent in a request last week.  [id number] [place] [address] [place] [address] Phone: [phone] Fax: [phone]

## 2017-06-30 NOTE — Telephone Encounter (Signed)
Called pt.  Informed that her Lorazepam had been reordered by Dr. Caryl Bis on 06/23/17 at the [facility] on [address] in [place].   Pt. stated she will check with the pharmacy.

## 2017-07-05 ENCOUNTER — Encounter: Payer: Self-pay | Admitting: Family Medicine

## 2017-07-05 ENCOUNTER — Ambulatory Visit (INDEPENDENT_AMBULATORY_CARE_PROVIDER_SITE_OTHER): Payer: Medicare Other | Admitting: Family Medicine

## 2017-07-05 VITALS — BP 140/90 | HR 83 | Temp 97.7°F | Ht 63.75 in | Wt 154.6 lb

## 2017-07-05 DIAGNOSIS — R030 Elevated blood-pressure reading, without diagnosis of hypertension: Secondary | ICD-10-CM

## 2017-07-05 DIAGNOSIS — J300 Vasomotor rhinitis: Secondary | ICD-10-CM | POA: Insufficient documentation

## 2017-07-05 DIAGNOSIS — J302 Other seasonal allergic rhinitis: Secondary | ICD-10-CM | POA: Diagnosis not present

## 2017-07-05 DIAGNOSIS — M5412 Radiculopathy, cervical region: Secondary | ICD-10-CM

## 2017-07-05 DIAGNOSIS — G4700 Insomnia, unspecified: Secondary | ICD-10-CM

## 2017-07-05 DIAGNOSIS — E785 Hyperlipidemia, unspecified: Secondary | ICD-10-CM

## 2017-07-05 DIAGNOSIS — D509 Iron deficiency anemia, unspecified: Secondary | ICD-10-CM

## 2017-07-05 DIAGNOSIS — J309 Allergic rhinitis, unspecified: Secondary | ICD-10-CM | POA: Insufficient documentation

## 2017-07-05 NOTE — Progress Notes (Signed)
Audrey Rumps, MD Phone: [phone]  Audrey Hamilton is a 72 y.o. female who presents today for f/u.  Neck pain: has improved significantly at this point.  She saw a chiropractor who did an adjustment and she had immediate release of the discomfort in her neck and arm.  The tingling has gone away.  X-rays show degenerative changes.  She notes no numbness.  No weakness.  Blood pressure elevated again today.  She was checking it previously at home following her last visit and it had improved.  Has not been checking it recently.  No chest pain or shortness of breath.  Takes lorazepam for sleep.  Notes this helps quite a bit.  No drowsiness.  No anxiety or depression.  Gets 5 hours of sleep with this.  Allergic rhinitis: Taking Xyzal and Singulair.  Has intermittent sneezing and rhinorrhea this time of year.  Occasionally adds Flonase.  Symptoms relatively well controlled on this regimen.  Social History   Tobacco Use  Smoking Status Former Smoker  . Last attempt to quit: 06/19/1995  . Years since quitting: 22.0  Smokeless Tobacco Never Used     ROS see history of present illness  Objective  Physical Exam Vitals:   07/05/17 1019 07/05/17 1040  BP: (!) 158/88 140/90  Pulse: 83   Temp: 97.7 F (36.5 C)   SpO2: 94%     BP Readings from Last 3 Encounters:  07/05/17 140/90  04/05/17 (!) 150/90  10/01/16 (!) 158/92   Wt Readings from Last 3 Encounters:  07/05/17 154 lb 9.6 oz (70.1 kg)  04/05/17 155 lb 3.2 oz (70.4 kg)  10/01/16 145 lb 8 oz (66 kg)    Physical Exam  Constitutional: No distress.  Cardiovascular: Normal rate, regular rhythm and normal heart sounds.  Pulmonary/Chest: Effort normal and breath sounds normal.  Musculoskeletal: She exhibits no edema.  No midline neck tenderness, no midline neck step-off, mild tenderness in the midportion of the right trapezius muscle though no spasm or or overlying skin changes, no other neck or trapezius tenderness,  5/5 strength bilateral quads, hamstrings, plantar flexion, and dorsiflexion, biceps, triceps, and grip, sensation light touch intact bilateral upper extremities and lower extremities  Neurological: She is alert. Gait normal.  Skin: Skin is warm and dry. She is not diaphoretic.     Assessment/Plan: Please see individual problem list.  Cervical radiculopathy Symptoms have essentially resolved since going to the chiropractor.  Discussed monitoring and if she has recurrent symptoms we could consider MRI or referral to a surgeon to consider treatment.  She notes she will continue to go to the chiropractor.  Iron deficiency anemia Needs recheck of labs.  She continues on iron supplementation 3 days weekly.  Insomnia Stable.  Continue Ativan.  Discussed monitoring for drowsiness.  Elevated blood pressure reading BP elevated today.  She will start checking again at home and return in 2 weeks for recheck with nursing.  If elevated at home would consider medication.  HLD (hyperlipidemia) Return for labs.  Allergic rhinitis Seasonal symptoms.  Relatively well controlled on current regimen.  She will monitor.  Orders Placed This Encounter  Procedures  . Comp Met (CMET)    Standing Status:   Future    Standing Expiration Date:   07/06/2018  . CBC    Standing Status:   Future    Standing Expiration Date:   07/06/2018  . Lipid panel    Standing Status:   Future    Standing Expiration Date:  07/06/2018  . Iron, TIBC and Ferritin Panel    Standing Status:   Future    Standing Expiration Date:   07/06/2018    No orders of the defined types were placed in this encounter.    Audrey Rumps, MD [place]

## 2017-07-05 NOTE — Assessment & Plan Note (Signed)
Return for labs.  

## 2017-07-05 NOTE — Assessment & Plan Note (Signed)
Needs recheck of labs.  She continues on iron supplementation 3 days weekly.

## 2017-07-05 NOTE — Assessment & Plan Note (Signed)
Symptoms have essentially resolved since going to the chiropractor.  Discussed monitoring and if she has recurrent symptoms we could consider MRI or referral to a surgeon to consider treatment.  She notes she will continue to go to the chiropractor.

## 2017-07-05 NOTE — Assessment & Plan Note (Signed)
Stable.  Continue Ativan.  Discussed monitoring for drowsiness.

## 2017-07-05 NOTE — Assessment & Plan Note (Signed)
BP elevated today.  She will start checking again at home and return in 2 weeks for recheck with nursing.  If elevated at home would consider medication.

## 2017-07-05 NOTE — Progress Notes (Signed)
Pre visit review using our clinic review tool, if applicable. No additional management support is needed unless otherwise documented below in the visit note. 

## 2017-07-05 NOTE — Patient Instructions (Addendum)
Nice to see you. Please monitor your neck pain. We will have you return for fasting labs. Please monitor your blood pressure for the next 2 weeks and we will recheck in the office with the nurse.  If elevated at home we will consider placing you on medication for this.

## 2017-07-05 NOTE — Assessment & Plan Note (Signed)
Seasonal symptoms.  Relatively well controlled on current regimen.  She will monitor.

## 2017-07-14 DIAGNOSIS — M9901 Segmental and somatic dysfunction of cervical region: Secondary | ICD-10-CM | POA: Diagnosis not present

## 2017-07-14 DIAGNOSIS — M5033 Other cervical disc degeneration, cervicothoracic region: Secondary | ICD-10-CM | POA: Diagnosis not present

## 2017-07-14 DIAGNOSIS — M9902 Segmental and somatic dysfunction of thoracic region: Secondary | ICD-10-CM | POA: Diagnosis not present

## 2017-07-14 DIAGNOSIS — M62830 Muscle spasm of back: Secondary | ICD-10-CM | POA: Diagnosis not present

## 2017-07-20 ENCOUNTER — Ambulatory Visit: Payer: Medicare Other

## 2017-07-27 ENCOUNTER — Other Ambulatory Visit (INDEPENDENT_AMBULATORY_CARE_PROVIDER_SITE_OTHER): Payer: Medicare Other

## 2017-07-27 ENCOUNTER — Ambulatory Visit (INDEPENDENT_AMBULATORY_CARE_PROVIDER_SITE_OTHER): Payer: Medicare Other

## 2017-07-27 DIAGNOSIS — R030 Elevated blood-pressure reading, without diagnosis of hypertension: Secondary | ICD-10-CM

## 2017-07-27 DIAGNOSIS — E785 Hyperlipidemia, unspecified: Secondary | ICD-10-CM

## 2017-07-27 DIAGNOSIS — D509 Iron deficiency anemia, unspecified: Secondary | ICD-10-CM | POA: Diagnosis not present

## 2017-07-27 LAB — IRON,TIBC AND FERRITIN PANEL
%SAT: 7 % (calc) — ABNORMAL LOW (ref 11–50)
Ferritin: 11 ng/mL — ABNORMAL LOW (ref 20–288)
Iron: 27 ug/dL — ABNORMAL LOW (ref 45–160)
TIBC: 364 mcg/dL (calc) (ref 250–450)

## 2017-07-27 LAB — COMPREHENSIVE METABOLIC PANEL
ALT: 10 U/L (ref 0–35)
AST: 14 U/L (ref 0–37)
Albumin: 3.8 g/dL (ref 3.5–5.2)
Alkaline Phosphatase: 82 U/L (ref 39–117)
BILIRUBIN TOTAL: 0.3 mg/dL (ref 0.2–1.2)
BUN: 7 mg/dL (ref 6–23)
CALCIUM: 9 mg/dL (ref 8.4–10.5)
CO2: 28 meq/L (ref 19–32)
CREATININE: 0.52 mg/dL (ref 0.40–1.20)
Chloride: 102 mEq/L (ref 96–112)
GFR: 123.49 mL/min (ref >60.00)
GLUCOSE: 91 mg/dL (ref 70–99)
Potassium: 3.3 mEq/L — ABNORMAL LOW (ref 3.5–5.1)
SODIUM: 139 meq/L (ref 135–145)
Total Protein: 7.2 g/dL (ref 6.0–8.3)

## 2017-07-27 LAB — CBC
HCT: 36.3 % (ref 36.0–46.0)
Hemoglobin: 11.7 g/dL — ABNORMAL LOW (ref 12.0–15.0)
MCHC: 32.4 g/dL (ref 30.0–36.0)
MCV: 76.9 fl — ABNORMAL LOW (ref 78.0–100.0)
PLATELETS: 357 10*3/uL (ref 150.0–400.0)
RBC: 4.72 Mil/uL (ref 3.87–5.11)
RDW: 18.1 % — ABNORMAL HIGH (ref 11.5–15.5)
WBC: 7.3 10*3/uL (ref 4.0–10.5)

## 2017-07-27 LAB — LDL CHOLESTEROL, DIRECT: Direct LDL: 234 mg/dL

## 2017-07-27 LAB — LIPID PANEL
Cholesterol: 305 mg/dL — ABNORMAL HIGH (ref 0–200)
HDL: 42.6 mg/dL (ref >39.00)
NonHDL: 262.67
Total CHOL/HDL Ratio: 7
Triglycerides: 306 mg/dL — ABNORMAL HIGH (ref 0.0–149.0)
VLDL: 61.2 mg/dL — ABNORMAL HIGH (ref 0.0–40.0)

## 2017-07-27 NOTE — Progress Notes (Addendum)
Patient comes in for 2 week blood pressure check .   She has been checking blood pressure at home has elevated blood pressure readings she brings in averaging 156/84-171-87.   Placed readings in your folder for review. Blood pressure checked right arm 178/90 left arm 164/90 pulse 75 O2 95%.  Patient denies chest pain , shortness of breath , no arm numbness only complains of headache occassionally.   Please advise.

## 2017-07-28 ENCOUNTER — Other Ambulatory Visit: Payer: Self-pay | Admitting: Family Medicine

## 2017-07-28 MED ORDER — AMLODIPINE BESYLATE 5 MG PO TABS
5.0000 mg | ORAL_TABLET | Freq: Every day | ORAL | 3 refills | Status: DC
Start: 2017-07-28 — End: 2018-03-03

## 2017-07-28 NOTE — Telephone Encounter (Signed)
Last OV: 04/05/17 PCP: [place]: [id number] [address], [place] [phone] (Phone) [phone] (Fax)

## 2017-07-28 NOTE — Addendum Note (Signed)
Addended by: Caryl Bis Thatcher Doberstein G on: 07/28/2017 01:14 PM   Modules accepted: Orders

## 2017-07-28 NOTE — Telephone Encounter (Signed)
Copied from [place] [id number]. Topic: Quick Communication - See Telephone Encounter >> Jul 28, 2017  2:47 PM Hewitt Shorts wrote: CRM for notification. See Telephone encounter for: 07/28/17.pt is needing a refill on tramadal   [facility] [address]  Best number [phone]

## 2017-07-28 NOTE — Progress Notes (Signed)
We should start on amlodipine. I will send this to her pharmacy. She will need a BP check with me or nursing in one month. She should continue to monitor her BP at home.

## 2017-07-28 NOTE — Progress Notes (Signed)
Left message to return call 

## 2017-07-29 MED ORDER — TRAMADOL HCL 50 MG PO TABS: 50.0000 mg | ORAL_TABLET | Freq: Three times a day (TID) | ORAL | 1 refills | Status: DC | PRN

## 2017-07-29 NOTE — Telephone Encounter (Signed)
Last OV 07/05/17 last filled 04/23/17 90 1rf

## 2017-07-30 NOTE — Progress Notes (Signed)
Left message to return call 

## 2017-07-30 NOTE — Progress Notes (Signed)
Spoke with [place] daughter per [facility] and advised of below . Blood pressure check scheduled for 1 month with daughter . Patient has already picked up medicine.   Patient will monitor blood pressure . Daughter reports a patient patient having a little dizziness at first when starting medicine.

## 2017-07-31 ENCOUNTER — Other Ambulatory Visit: Payer: Self-pay | Admitting: Family Medicine

## 2017-07-31 DIAGNOSIS — D649 Anemia, unspecified: Secondary | ICD-10-CM

## 2017-07-31 DIAGNOSIS — E785 Hyperlipidemia, unspecified: Secondary | ICD-10-CM

## 2017-07-31 DIAGNOSIS — E876 Hypokalemia: Secondary | ICD-10-CM

## 2017-07-31 MED ORDER — ROSUVASTATIN CALCIUM 40 MG PO TABS: 40.0000 mg | ORAL_TABLET | Freq: Every day | ORAL | 3 refills | Status: DC

## 2017-07-31 NOTE — Progress Notes (Signed)
They should monitor the dizziness and if not improving or if it worsens let us know.

## 2017-08-02 NOTE — Progress Notes (Signed)
Spoke with [place] daughter , she states mom dizziness has improved only had dizziness for 1 day.

## 2017-08-03 ENCOUNTER — Other Ambulatory Visit (INDEPENDENT_AMBULATORY_CARE_PROVIDER_SITE_OTHER): Payer: Medicare Other

## 2017-08-03 DIAGNOSIS — E876 Hypokalemia: Secondary | ICD-10-CM

## 2017-08-03 DIAGNOSIS — D649 Anemia, unspecified: Secondary | ICD-10-CM

## 2017-08-03 DIAGNOSIS — E785 Hyperlipidemia, unspecified: Secondary | ICD-10-CM | POA: Diagnosis not present

## 2017-08-03 LAB — HEPATIC FUNCTION PANEL
ALT: 7 U/L (ref 0–35)
AST: 10 U/L (ref 0–37)
Albumin: 3.7 g/dL (ref 3.5–5.2)
Alkaline Phosphatase: 86 U/L (ref 39–117)
Bilirubin, Direct: 0 mg/dL (ref 0.0–0.3)
Total Bilirubin: 0.2 mg/dL (ref 0.2–1.2)
Total Protein: 7 g/dL (ref 6.0–8.3)

## 2017-08-03 LAB — CBC
HEMATOCRIT: 35.9 % — AB (ref 36.0–46.0)
Hemoglobin: 11.4 g/dL — ABNORMAL LOW (ref 12.0–15.0)
MCHC: 31.7 g/dL (ref 30.0–36.0)
MCV: 76.4 fl — ABNORMAL LOW (ref 78.0–100.0)
Platelets: 346 10*3/uL (ref 150.0–400.0)
RBC: 4.71 Mil/uL (ref 3.87–5.11)
RDW: 18.2 % — AB (ref 11.5–15.5)
WBC: 7.7 10*3/uL (ref 4.0–10.5)

## 2017-08-03 LAB — LDL CHOLESTEROL, DIRECT: LDL DIRECT: 197 mg/dL

## 2017-08-03 LAB — POTASSIUM: Potassium: 3.7 mEq/L (ref 3.5–5.1)

## 2017-08-04 ENCOUNTER — Other Ambulatory Visit: Payer: Medicare Other

## 2017-08-04 ENCOUNTER — Telehealth: Payer: Self-pay

## 2017-08-04 LAB — IRON,TIBC AND FERRITIN PANEL
%SAT: 17 % (calc) (ref 11–50)
FERRITIN: 15 ng/mL — AB (ref 20–288)
IRON: 61 ug/dL (ref 45–160)
TIBC: 363 mcg/dL (calc) (ref 250–450)

## 2017-08-04 NOTE — Telephone Encounter (Signed)
Left voicemail for patient to call office back for lab results

## 2017-08-04 NOTE — Telephone Encounter (Signed)
-----   Message from Leone Haven, MD sent at 08/03/2017  5:57 PM EDT ----- Please let the patient know that her potassium is now normal.  The lab drew her cholesterol and liver function as well as her anemia studies prior to when they needed to have been drawn.  Her anemia is relatively stable.  Her cholesterol is slightly improved.  She still needs to have these things redrawn in a month.  Thanks.

## 2017-08-05 ENCOUNTER — Telehealth: Payer: Self-pay

## 2017-08-05 NOTE — Telephone Encounter (Signed)
-----   Message from Leone Haven, MD sent at 08/03/2017  5:57 PM EDT ----- Please let the patient know that her potassium is now normal.  The lab drew her cholesterol and liver function as well as her anemia studies prior to when they needed to have been drawn.  Her anemia is relatively stable.  Her cholesterol is slightly improved.  She still needs to have these things redrawn in a month.  Thanks.

## 2017-08-05 NOTE — Telephone Encounter (Signed)
Please let the patient know that her potassium is now normal. The lab drew her cholesterol and liver function as well as her anemia studies prior to when they needed to have been drawn. Her anemia is relatively stable. Her cholesterol is slightly improved. She still needs to have these things redrawn in a month. Thanks.    Detailed voicemail left for patient with lab results per DPR, routed to [facility] to give results and schedule lab appointment.

## 2017-08-06 ENCOUNTER — Other Ambulatory Visit: Payer: Self-pay | Admitting: Family Medicine

## 2017-08-11 ENCOUNTER — Other Ambulatory Visit: Payer: Self-pay | Admitting: Family Medicine

## 2017-08-11 DIAGNOSIS — M0609 Rheumatoid arthritis without rheumatoid factor, multiple sites: Secondary | ICD-10-CM | POA: Diagnosis not present

## 2017-08-11 DIAGNOSIS — M170 Bilateral primary osteoarthritis of knee: Secondary | ICD-10-CM | POA: Diagnosis not present

## 2017-08-11 DIAGNOSIS — Z79899 Other long term (current) drug therapy: Secondary | ICD-10-CM | POA: Diagnosis not present

## 2017-08-12 DIAGNOSIS — M170 Bilateral primary osteoarthritis of knee: Secondary | ICD-10-CM | POA: Diagnosis not present

## 2017-08-12 DIAGNOSIS — M9901 Segmental and somatic dysfunction of cervical region: Secondary | ICD-10-CM | POA: Diagnosis not present

## 2017-08-12 DIAGNOSIS — M62830 Muscle spasm of back: Secondary | ICD-10-CM | POA: Diagnosis not present

## 2017-08-12 DIAGNOSIS — M5033 Other cervical disc degeneration, cervicothoracic region: Secondary | ICD-10-CM | POA: Diagnosis not present

## 2017-08-12 DIAGNOSIS — M9902 Segmental and somatic dysfunction of thoracic region: Secondary | ICD-10-CM | POA: Diagnosis not present

## 2017-08-19 DIAGNOSIS — M62830 Muscle spasm of back: Secondary | ICD-10-CM | POA: Diagnosis not present

## 2017-08-19 DIAGNOSIS — M9902 Segmental and somatic dysfunction of thoracic region: Secondary | ICD-10-CM | POA: Diagnosis not present

## 2017-08-19 DIAGNOSIS — M5033 Other cervical disc degeneration, cervicothoracic region: Secondary | ICD-10-CM | POA: Diagnosis not present

## 2017-08-19 DIAGNOSIS — M9901 Segmental and somatic dysfunction of cervical region: Secondary | ICD-10-CM | POA: Diagnosis not present

## 2017-08-20 ENCOUNTER — Telehealth: Payer: Self-pay | Admitting: *Deleted

## 2017-08-20 DIAGNOSIS — D649 Anemia, unspecified: Secondary | ICD-10-CM

## 2017-08-20 DIAGNOSIS — E785 Hyperlipidemia, unspecified: Secondary | ICD-10-CM

## 2017-08-20 NOTE — Telephone Encounter (Signed)
Pt has upcoming lab appt. Please place future lab orders.

## 2017-08-23 NOTE — Telephone Encounter (Signed)
Ordered

## 2017-08-25 ENCOUNTER — Ambulatory Visit: Payer: Medicare Other

## 2017-08-25 ENCOUNTER — Ambulatory Visit (INDEPENDENT_AMBULATORY_CARE_PROVIDER_SITE_OTHER): Payer: Medicare Other

## 2017-08-25 ENCOUNTER — Other Ambulatory Visit (INDEPENDENT_AMBULATORY_CARE_PROVIDER_SITE_OTHER): Payer: Medicare Other

## 2017-08-25 VITALS — BP 124/82 | HR 89

## 2017-08-25 DIAGNOSIS — E785 Hyperlipidemia, unspecified: Secondary | ICD-10-CM | POA: Diagnosis not present

## 2017-08-25 DIAGNOSIS — I10 Essential (primary) hypertension: Secondary | ICD-10-CM | POA: Diagnosis not present

## 2017-08-25 DIAGNOSIS — D649 Anemia, unspecified: Secondary | ICD-10-CM

## 2017-08-25 DIAGNOSIS — R030 Elevated blood-pressure reading, without diagnosis of hypertension: Secondary | ICD-10-CM

## 2017-08-25 LAB — IRON,TIBC AND FERRITIN PANEL
%SAT: 13 % (calc) (ref 11–50)
Ferritin: 19 ng/mL — ABNORMAL LOW (ref 20–288)
Iron: 47 ug/dL (ref 45–160)
TIBC: 372 ug/dL (ref 250–450)

## 2017-08-25 LAB — HEPATIC FUNCTION PANEL
ALK PHOS: 73 U/L (ref 39–117)
ALT: 13 U/L (ref 0–35)
AST: 19 U/L (ref 0–37)
Albumin: 3.8 g/dL (ref 3.5–5.2)
BILIRUBIN DIRECT: 0.1 mg/dL (ref 0.0–0.3)
BILIRUBIN TOTAL: 0.2 mg/dL (ref 0.2–1.2)
Total Protein: 7.4 g/dL (ref 6.0–8.3)

## 2017-08-25 LAB — CBC
HEMATOCRIT: 37.4 % (ref 36.0–46.0)
Hemoglobin: 12.1 g/dL (ref 12.0–15.0)
MCHC: 32.4 g/dL (ref 30.0–36.0)
MCV: 77.1 fl — ABNORMAL LOW (ref 78.0–100.0)
Platelets: 338 10*3/uL (ref 150.0–400.0)
RBC: 4.86 Mil/uL (ref 3.87–5.11)
RDW: 19.1 % — ABNORMAL HIGH (ref 11.5–15.5)
WBC: 9.4 10*3/uL (ref 4.0–10.5)

## 2017-08-25 LAB — LDL CHOLESTEROL, DIRECT: Direct LDL: 114 mg/dL

## 2017-08-25 NOTE — Progress Notes (Signed)
Patient here today for BP check. Patient brought her BP cuff as well. With patients cuff BP in left arm 127/82 P: 90  BP: 122/82 ( left arm) P: 90 O2: 95  BP: 124/82 ( right arm) P: 89 O2: 96  Patient is taking current medication as prescribed.

## 2017-08-25 NOTE — Progress Notes (Addendum)
BP is adequately controlled.  She has hypertension.  She would continue her current regimen and monitor at home.

## 2017-08-26 NOTE — Progress Notes (Signed)
Left voicemail for patient per DPR that Dr. Caryl Bis says that BP are adequately controlled and to continue current regimen of medications and to call the office if she has any questions or concerns.

## 2017-09-02 DIAGNOSIS — M9902 Segmental and somatic dysfunction of thoracic region: Secondary | ICD-10-CM | POA: Diagnosis not present

## 2017-09-02 DIAGNOSIS — M62830 Muscle spasm of back: Secondary | ICD-10-CM | POA: Diagnosis not present

## 2017-09-02 DIAGNOSIS — M9901 Segmental and somatic dysfunction of cervical region: Secondary | ICD-10-CM | POA: Diagnosis not present

## 2017-09-02 DIAGNOSIS — M5033 Other cervical disc degeneration, cervicothoracic region: Secondary | ICD-10-CM | POA: Diagnosis not present

## 2017-09-14 ENCOUNTER — Other Ambulatory Visit: Payer: Self-pay

## 2017-09-14 DIAGNOSIS — D509 Iron deficiency anemia, unspecified: Secondary | ICD-10-CM

## 2017-09-23 DIAGNOSIS — M9901 Segmental and somatic dysfunction of cervical region: Secondary | ICD-10-CM | POA: Diagnosis not present

## 2017-09-23 DIAGNOSIS — M9902 Segmental and somatic dysfunction of thoracic region: Secondary | ICD-10-CM | POA: Diagnosis not present

## 2017-09-23 DIAGNOSIS — M5033 Other cervical disc degeneration, cervicothoracic region: Secondary | ICD-10-CM | POA: Diagnosis not present

## 2017-09-23 DIAGNOSIS — M62830 Muscle spasm of back: Secondary | ICD-10-CM | POA: Diagnosis not present

## 2017-10-05 ENCOUNTER — Ambulatory Visit: Payer: Medicare Other | Admitting: Family Medicine

## 2017-10-06 ENCOUNTER — Ambulatory Visit: Payer: Self-pay | Admitting: *Deleted

## 2017-10-06 DIAGNOSIS — M1711 Unilateral primary osteoarthritis, right knee: Secondary | ICD-10-CM | POA: Diagnosis not present

## 2017-10-06 DIAGNOSIS — Z79899 Other long term (current) drug therapy: Secondary | ICD-10-CM | POA: Diagnosis not present

## 2017-10-06 DIAGNOSIS — M25561 Pain in right knee: Secondary | ICD-10-CM | POA: Diagnosis not present

## 2017-10-06 DIAGNOSIS — M19041 Primary osteoarthritis, right hand: Secondary | ICD-10-CM | POA: Diagnosis not present

## 2017-10-06 DIAGNOSIS — M170 Bilateral primary osteoarthritis of knee: Secondary | ICD-10-CM | POA: Diagnosis not present

## 2017-10-06 DIAGNOSIS — M0609 Rheumatoid arthritis without rheumatoid factor, multiple sites: Secondary | ICD-10-CM | POA: Diagnosis not present

## 2017-10-06 NOTE — Telephone Encounter (Signed)
Patient states she was in her kitchen yesterday and her knee gave out- she is wondering if she needs to see PCP or her rheumatologist who gave her the injection in April. Triaged patient and advised her she needs to be seen rather quickly- if not by rheumatologist than UC today. She needs to call us back to follow up. Patient states she will.  Reason for Disposition . [1] SEVERE pain (e.g., excruciating, unable to walk) AND [2] not improved after 2 hours of pain medicine  Answer Assessment - Initial Assessment Questions 1. LOCATION and RADIATION: "Where is the pain located?"      R knee pain 2. QUALITY: "What does the pain feel like?"  (e.g., sharp, dull, aching, burning)     Sharp pain 3. SEVERITY: "How bad is the pain?" "What does it keep you from doing?"   (Scale 1-10; or mild, moderate, severe)   -  MILD (1-3): doesn't interfere with normal activities    -  MODERATE (4-7): interferes with normal activities (e.g., work or school) or awakens from sleep, limping    -  SEVERE (8-10): excruciating pain, unable to do any normal activities, unable to walk     10 with weight 4. ONSET: "When did the pain start?" "Does it come and go, or is it there all the time?"     On going- yesterday she could not put weight on it 5. RECURRENT: "Have you had this pain before?" If so, ask: "When, and what happened then?"     Yes- osteoarthritis   Injection in April  6. SETTING: "Has there been any recent work, exercise or other activity that involved that part of the body?"      No- recent activity 7. AGGRAVATING FACTORS: "What makes the knee pain worse?" (e.g., walking, climbing stairs, running)     standing 8. ASSOCIATED SYMPTOMS: "Is there any swelling or redness of the knee?"     No swelling or redness 9. OTHER SYMPTOMS: "Do you have any other symptoms?" (e.g., chest pain, difficulty breathing, fever, calf pain)     no 10. PREGNANCY: "Is there any chance you are pregnant?" "When was your last menstrual  period?"       n/a  Protocols used: KNEE PAIN-A-AH

## 2017-10-07 DIAGNOSIS — M1711 Unilateral primary osteoarthritis, right knee: Secondary | ICD-10-CM | POA: Diagnosis not present

## 2017-10-18 ENCOUNTER — Other Ambulatory Visit: Payer: Self-pay | Admitting: Family Medicine

## 2017-10-19 NOTE — Telephone Encounter (Signed)
Last OV 07/05/17 last filled 07/29/17 90 1rf

## 2017-10-20 NOTE — Telephone Encounter (Signed)
Sent to pharmacy.  Drug database reviewed. 

## 2017-10-27 ENCOUNTER — Ambulatory Visit (INDEPENDENT_AMBULATORY_CARE_PROVIDER_SITE_OTHER): Payer: Medicare Other

## 2017-10-27 VITALS — BP 122/82 | HR 69 | Temp 98.7°F | Resp 14 | Ht 64.0 in | Wt 159.0 lb

## 2017-10-27 DIAGNOSIS — Z0000 Encounter for general adult medical examination without abnormal findings: Secondary | ICD-10-CM

## 2017-10-27 NOTE — Patient Instructions (Addendum)
  Audrey Hamilton , Thank you for taking time to come for your Medicare Wellness Visit. I appreciate your ongoing commitment to your health goals. Please review the following plan we discussed and let me know if I can assist you in the future.   Bring a copy of your [place] and/or Living Will to be scanned into chart.  Have a great day!  These are the goals we discussed: Goals    . Increase physical activity       This is a list of the screening recommended for you and due dates:  Health Maintenance  Topic Date Due  . Tetanus Vaccine  05/30/1964  . Flu Shot  11/18/2017  . Mammogram  08/21/2018  . Colon Cancer Screening  06/19/2025  . DEXA scan (bone density measurement)  Completed  .  Hepatitis C: One time screening is recommended by Center for Disease Control  (CDC) for  adults born from [birth year] through 1965.   Completed  . Pneumonia vaccines  Addressed

## 2017-10-27 NOTE — Progress Notes (Signed)
Subjective:   Audrey Hamilton is a 72 y.o. female who presents for an Initial Medicare Annual Wellness Visit.  Review of Systems    No ROS.  Medicare Wellness Visit. Additional risk factors are reflected in the social history.  Cardiac Risk Factors include: advanced age (>74men, >32 women)     Objective:    Today's Vitals   10/27/17 0853 10/27/17 0916  BP:  122/82  Pulse:  69  Resp:  14  Temp:  98.7 F (37.1 C)  TempSrc:  Oral  SpO2:  95%  Weight:  159 lb (72.1 kg)  Height:  5\' 4"  (1.626 m)  PainSc: 5     Body mass index is 27.29 kg/m.  Advanced Directives 10/27/2017 07/22/2015  Does Patient Have a Medical Advance Directive? Yes Yes  Type of Paramedic of [employer];Living will [place];Living will  Does patient want to make changes to medical advance directive? No - Patient declined No - Patient declined  Copy of [place] in Chart? No - copy requested No - copy requested    Current Medications (verified) Outpatient Encounter Medications as of 10/27/2017  Medication Sig  . amLODipine (NORVASC) 5 MG tablet Take 1 tablet (5 mg total) by mouth daily.  . baclofen (LIORESAL) 10 MG tablet Take 0.5 tablets (5 mg total) by mouth 3 (three) times daily as needed for muscle spasms.  . Ferrous Fum-Iron Polysacch (TANDEM PO) Take 106 mg by mouth daily.  . fluticasone (FLONASE) 50 MCG/ACT nasal spray Place into the nose at bedtime. Reported on 10/01/2015  . leflunomide (ARAVA) 10 MG tablet 1 tab daily x 90 days  . Levocetirizine Dihydrochloride (XYZAL ALLERGY 24HR PO) Take 1 tablet by mouth daily.  Marland Kitchen LORazepam (ATIVAN) 1 MG tablet TAKE 1 TABLET BY MOUTH AT BEDTIME  . montelukast (SINGULAIR) 10 MG tablet TAKE 1 TABLET BY MOUTH EVERYDAY AT BEDTIME  . omeprazole (PRILOSEC) 20 MG capsule Take by mouth.  . rosuvastatin (CRESTOR) 40 MG tablet Take 1 tablet (40 mg total) by mouth daily.  . traMADol (ULTRAM) 50 MG tablet TAKE  1 TABLET (50 MG TOTAL) BY MOUTH EVERY 8 (EIGHT) HOURS AS NEEDED.  . [DISCONTINUED] predniSONE (DELTASONE) 10 MG tablet Take 50 mg (5 tablets) by mouth today, then decrease by 1 tablet daily until gone   No facility-administered encounter medications on file as of 10/27/2017.     Allergies (verified) Amoxicillin-pot clavulanate; Aspirin; Fluconazole; Oxaprozin; and Sulfa antibiotics   History: Past Medical History:  Diagnosis Date  . Allergy   . Anxiety    controlled  . Arthritis   . Chicken pox   . Colon polyps   . Depression   . Fibromyalgia   . History of hypothyroidism   . Hyperlipidemia   . Hypokalemia   . Multiple gastric ulcers   . Rheumatoid arthritis ([place]) Since 1995   currently in a flare up   Past Surgical History:  Procedure Laterality Date  . ABDOMINAL HYSTERECTOMY  1974  . AUGMENTATION MAMMAPLASTY Bilateral 4081'K   silicone  . EYE SURGERY    . FOOT SURGERY Left 11/22/10  . MASTECTOMY Bilateral 1970's   SUBCUTANEOUS MASTECTOMY - NO CANCER  . SMALL INTESTINE SURGERY  12/2001  . STOMACH SURGERY  12/2001  . TONSILLECTOMY AND ADENOIDECTOMY  1966  . UPPER GASTROINTESTINAL ENDOSCOPY     Family History  Problem Relation Age of Onset  . Arthritis Mother   . Sudden death Mother  after birth of pt who was 96 days old.  . Depression Mother   . Arthritis Maternal Grandmother   . Cancer Maternal Grandmother   . Hyperlipidemia Maternal Grandfather   . Diabetes Mellitus II Father   . Liver disease Father   . Breast cancer Neg Hx    Social History   Socioeconomic History  . Marital status: Widowed    Spouse name: Not on file  . Number of children: Not on file  . Years of education: Not on file  . Highest education level: Not on file  Occupational History  . Not on file  Social Needs  . Financial resource strain: Not hard at all  . Food insecurity:    Worry: Never true    Inability: Never true  . Transportation needs:    Medical: No    Non-medical:  No  Tobacco Use  . Smoking status: Former Smoker    Last attempt to quit: 06/19/1995    Years since quitting: 22.3  . Smokeless tobacco: Never Used  Substance and Sexual Activity  . Alcohol use: No    Alcohol/week: 0.0 - 0.6 oz  . Drug use: No  . Sexual activity: Not on file  Lifestyle  . Physical activity:    Days per week: Not on file    Minutes per session: Not on file  . Stress: Not at all  Relationships  . Social connections:    Talks on phone: Not on file    Gets together: Not on file    Attends religious service: Not on file    Active member of club or organization: Not on file    Attends meetings of clubs or organizations: Not on file    Relationship status: Not on file  Other Topics Concern  . Not on file  Social History Narrative  . Not on file    Tobacco Counseling Counseling given: Not Answered   Clinical Intake:  Pre-visit preparation completed: Yes  Pain : 0-10 Pain Score: 5  Pain Type: Chronic pain Pain Descriptors / Indicators: Discomfort, Constant, Tender, Tingling, Sore Pain Frequency: Constant Pain Relieving Factors: ice pack Effect of Pain on Daily Activities: self pace. rest.   Pain Relieving Factors: ice pack  Nutritional Status: BMI 25 -29 Overweight Diabetes: No  How often do you need to have someone help you when you read instructions, pamphlets, or other written materials from your doctor or pharmacy?: 1 - Never  Interpreter Needed?: No      Activities of Daily Living In your present state of health, do you have any difficulty performing the following activities: 10/27/2017  Hearing? N  Vision? N  Difficulty concentrating or making decisions? N  Walking or climbing stairs? Y  Comment R knee pain  Dressing or bathing? N  Doing errands, shopping? N  Preparing Food and eating ? N  Using the Toilet? N  In the past six months, have you accidently leaked urine? N  Do you have problems with loss of bowel control? N  Managing your  Medications? N  Managing your Finances? N  Housekeeping or managing your Housekeeping? N  Some recent data might be hidden     Immunizations and Health Maintenance Immunization History  Administered Date(s) Administered  . Influenza-Unspecified 12/20/2014, 01/02/2016   Health Maintenance Due  Topic Date Due  . TETANUS/TDAP  05/30/1965    Patient Care Team: Leone Haven, MD as PCP - General (Family Medicine)  Indicate any recent Medical Services you may  have received from other than [facility] providers in the past year (date may be approximate).     Assessment:   This is a routine wellness examination for Roselyn.  The goal of the wellness visit is to assist the patient how to close the gaps in care and create a preventative care plan for the patient.   The roster of all physicians providing medical care to patient is listed in the Snapshot section of the chart.  Osteoporosis risk reviewed.    Safety issues reviewed; Life alert, smoke and carbon monoxide detectors in the home. No firearms in the home. Wears seatbelts when driving or riding with others. No violence in the home.  They do not have excessive sun exposure.  Discussed the need for sun protection: hats, long sleeves and the use of sunscreen if there is significant sun exposure.  Patient is alert, normal appearance, oriented to person/place/and time. Correctly identified the president of the [place] and recalls of 3/3 words.Performs simple calculations and can read correct time from watch face. Displays appropriate judgement.  No new identified risk were noted.  No failures at ADL's or IADL's.  Ambulates with cane/walker as needed.  BMI- discussed the importance of a healthy diet, water intake and the benefits of aerobic exercise. Educational material provided.   24 hour diet recall: Low carb diet  Dental- dentures.  Sleep patterns- Sleeps without issues.   TDAP vaccine deferred per patient preference.  Follow  up with insurance.  Educational material provided.  Patient Concerns: None at this time. Follow up with PCP as needed.  Hearing/Vision screen Hearing Screening Comments: Patient is able to hear conversational tones without difficulty.  No issues reported.   Vision Screening Comments: Followed by Dr. Matilde Sprang Wears corrective lenses Last OV 05/2017 Cataract extraction, bilateral Visual acuity not assessed per patient preference since they have regular follow up with the ophthalmologist  Dietary issues and exercise activities discussed: Current Exercise Habits: Home exercise routine, Type of exercise: walking, Time (Minutes): 60, Frequency (Times/Week): 2, Weekly Exercise (Minutes/Week): 120, Intensity: Mild  Goals    . Increase physical activity      Depression Screen PHQ 2/9 Scores 10/27/2017 07/05/2017 07/01/2015  PHQ - 2 Score 0 0 0    Fall Risk Fall Risk  10/27/2017 07/05/2017 04/05/2017 07/01/2015  Falls in the past year? No No No Yes  Number falls in past yr: - - - 2 or more  Injury with Fall? - - - Yes   Cognitive Function: MMSE - Mini Mental State Exam 10/27/2017  Orientation to time 5  Orientation to Place 5  Registration 3  Attention/ Calculation 5  Recall 3  Language- name 2 objects 2  Language- repeat 1  Language- follow 3 step command 3  Language- read & follow direction 1  Write a sentence 1  Copy design 1  Total score 30        Screening Tests Health Maintenance  Topic Date Due  . TETANUS/TDAP  05/30/1965  . INFLUENZA VACCINE  11/18/2017  . MAMMOGRAM  08/21/2018  . COLONOSCOPY  06/19/2025  . DEXA SCAN  Completed  . Hepatitis C Screening  Completed  . PNA vac Low Risk Adult  Addressed     Plan:    End of life planning; Advance aging; Advanced directives discussed. Copy of current HCPOA/Living Will requested.    I have personally reviewed and noted the following in the patient's chart:   . Medical and social history . Use of alcohol, tobacco  or  illicit drugs  . Current medications and supplements . Functional ability and status . Nutritional status . Physical activity . Advanced directives . List of other physicians . Hospitalizations, surgeries, and ER visits in previous 12 months . Vitals . Screenings to include cognitive, depression, and falls . Referrals and appointments  In addition, I have reviewed and discussed with patient certain preventive protocols, quality metrics, and best practice recommendations. A written personalized care plan for preventive services as well as general preventive health recommendations were provided to patient.     Varney Biles, LPN   1/99/1444

## 2017-10-28 DIAGNOSIS — M62830 Muscle spasm of back: Secondary | ICD-10-CM | POA: Diagnosis not present

## 2017-10-28 DIAGNOSIS — M9902 Segmental and somatic dysfunction of thoracic region: Secondary | ICD-10-CM | POA: Diagnosis not present

## 2017-10-28 DIAGNOSIS — M5033 Other cervical disc degeneration, cervicothoracic region: Secondary | ICD-10-CM | POA: Diagnosis not present

## 2017-10-28 DIAGNOSIS — M9901 Segmental and somatic dysfunction of cervical region: Secondary | ICD-10-CM | POA: Diagnosis not present

## 2017-11-04 ENCOUNTER — Other Ambulatory Visit: Payer: Self-pay | Admitting: Family Medicine

## 2017-11-05 ENCOUNTER — Ambulatory Visit: Payer: Medicare Other

## 2017-11-29 DIAGNOSIS — M9901 Segmental and somatic dysfunction of cervical region: Secondary | ICD-10-CM | POA: Diagnosis not present

## 2017-11-29 DIAGNOSIS — M5033 Other cervical disc degeneration, cervicothoracic region: Secondary | ICD-10-CM | POA: Diagnosis not present

## 2017-11-29 DIAGNOSIS — M62830 Muscle spasm of back: Secondary | ICD-10-CM | POA: Diagnosis not present

## 2017-11-29 DIAGNOSIS — M9902 Segmental and somatic dysfunction of thoracic region: Secondary | ICD-10-CM | POA: Diagnosis not present

## 2017-12-13 DIAGNOSIS — Z23 Encounter for immunization: Secondary | ICD-10-CM | POA: Diagnosis not present

## 2017-12-15 ENCOUNTER — Other Ambulatory Visit (INDEPENDENT_AMBULATORY_CARE_PROVIDER_SITE_OTHER): Payer: Medicare Other

## 2017-12-15 DIAGNOSIS — D509 Iron deficiency anemia, unspecified: Secondary | ICD-10-CM

## 2017-12-15 NOTE — Addendum Note (Signed)
Addended by: Arby Barrette on: 12/15/2017 08:09 AM   Modules accepted: Orders

## 2017-12-16 LAB — CBC
HCT: 37.9 % (ref 35.0–45.0)
Hemoglobin: 12.2 g/dL (ref 11.7–15.5)
MCH: 26.2 pg — ABNORMAL LOW (ref 27.0–33.0)
MCHC: 32.2 g/dL (ref 32.0–36.0)
MCV: 81.3 fL (ref 80.0–100.0)
MPV: 11.6 fL (ref 7.5–12.5)
PLATELETS: 252 10*3/uL (ref 140–400)
RBC: 4.66 10*6/uL (ref 3.80–5.10)
RDW: 15.9 % — ABNORMAL HIGH (ref 11.0–15.0)
WBC: 4.5 10*3/uL (ref 3.8–10.8)

## 2017-12-16 LAB — IRON,TIBC AND FERRITIN PANEL
%SAT: 14 % — AB (ref 16–45)
Ferritin: 26 ng/mL (ref 16–288)
Iron: 48 ug/dL (ref 45–160)
TIBC: 351 ug/dL (ref 250–450)

## 2017-12-22 ENCOUNTER — Other Ambulatory Visit: Payer: Self-pay | Admitting: Family Medicine

## 2017-12-22 NOTE — Telephone Encounter (Signed)
lorazepam refill Last Refill:06/23/17 # 90 tan 1 RF Last OV: 07/05/17 PCP: Dr. Caryl Bis [facility]acy:[facility] [phone]

## 2017-12-22 NOTE — Telephone Encounter (Signed)
Copied from [place] [id number]. Topic: Quick Communication - Rx Refill/Question >> Dec 22, 2017 11:57 AM Sheran Luz wrote: Medication: LORazepam (ATIVAN) 1 MG tablet [[id number]]    Preferred Pharmacy (with phone number or street name): [id number] [address], [place] [phone] (Phone) [phone] (Fax)

## 2017-12-26 ENCOUNTER — Other Ambulatory Visit: Payer: Self-pay | Admitting: Family Medicine

## 2017-12-27 MED ORDER — LORAZEPAM 1 MG PO TABS: 1.0000 mg | ORAL_TABLET | Freq: Every day | ORAL | 0 refills | Status: DC

## 2017-12-27 NOTE — Telephone Encounter (Signed)
Call pt  I have refilled your medication, ativan,   for 30 days.   I am covering your provider while out of this office.   Please be sure to follow up with provider for next refill in which they can give refills, etc as typically prescribed  Joycelyn Schmid, NP  I looked up patient on [place] Controlled Substances Reporting System and saw no activity that raised concern of inappropriate use.

## 2017-12-27 NOTE — Telephone Encounter (Signed)
Okay to refill? Last written on 06/23/17 for #90 with one refill.  LOV: 07/05/17 NOV: 10/31/2018

## 2017-12-27 NOTE — Telephone Encounter (Signed)
rx refill request

## 2017-12-31 DIAGNOSIS — M9901 Segmental and somatic dysfunction of cervical region: Secondary | ICD-10-CM | POA: Diagnosis not present

## 2017-12-31 DIAGNOSIS — M62830 Muscle spasm of back: Secondary | ICD-10-CM | POA: Diagnosis not present

## 2017-12-31 DIAGNOSIS — M5033 Other cervical disc degeneration, cervicothoracic region: Secondary | ICD-10-CM | POA: Diagnosis not present

## 2017-12-31 DIAGNOSIS — M9902 Segmental and somatic dysfunction of thoracic region: Secondary | ICD-10-CM | POA: Diagnosis not present

## 2018-01-20 ENCOUNTER — Other Ambulatory Visit: Payer: Self-pay | Admitting: Family

## 2018-01-20 ENCOUNTER — Other Ambulatory Visit: Payer: Self-pay | Admitting: Family Medicine

## 2018-01-20 NOTE — Telephone Encounter (Signed)
Last OV 07/05/2017   Last refilled 10/20/2017 disp 90 with 1 refill   Sent to PCP for approval

## 2018-01-20 NOTE — Telephone Encounter (Signed)
Last refill 12/27/17 Last office visit 07/31/17 Next office visit 10/31/18

## 2018-01-21 NOTE — Telephone Encounter (Signed)
Controlled substance database reviewed.  Refill sent to pharmacy.  Patient needs to schedule a follow-up visit to schedule for further refills.  Please contact her to get this set up.

## 2018-01-21 NOTE — Telephone Encounter (Signed)
Sent to pharmacy.  Controlled substance database reviewed.  The patient needs a follow-up visit scheduled for sometime in the next several months to receive further refills of this medication.  Please contact her to get her set up for this.  It is okay to place her in a 4:30 time slot on a Monday, Wednesday, or Friday.

## 2018-01-28 DIAGNOSIS — M9902 Segmental and somatic dysfunction of thoracic region: Secondary | ICD-10-CM | POA: Diagnosis not present

## 2018-01-28 DIAGNOSIS — M62830 Muscle spasm of back: Secondary | ICD-10-CM | POA: Diagnosis not present

## 2018-01-28 DIAGNOSIS — M9901 Segmental and somatic dysfunction of cervical region: Secondary | ICD-10-CM | POA: Diagnosis not present

## 2018-01-28 DIAGNOSIS — M5033 Other cervical disc degeneration, cervicothoracic region: Secondary | ICD-10-CM | POA: Diagnosis not present

## 2018-02-10 DIAGNOSIS — M0609 Rheumatoid arthritis without rheumatoid factor, multiple sites: Secondary | ICD-10-CM | POA: Diagnosis not present

## 2018-02-10 DIAGNOSIS — Z79899 Other long term (current) drug therapy: Secondary | ICD-10-CM | POA: Diagnosis not present

## 2018-02-10 DIAGNOSIS — M170 Bilateral primary osteoarthritis of knee: Secondary | ICD-10-CM | POA: Diagnosis not present

## 2018-02-21 ENCOUNTER — Encounter: Payer: Self-pay | Admitting: Family Medicine

## 2018-02-21 ENCOUNTER — Ambulatory Visit (INDEPENDENT_AMBULATORY_CARE_PROVIDER_SITE_OTHER): Payer: Medicare Other | Admitting: Family Medicine

## 2018-02-21 VITALS — BP 162/100 | HR 78 | Temp 98.4°F | Ht 64.0 in | Wt 167.6 lb

## 2018-02-21 DIAGNOSIS — Z1231 Encounter for screening mammogram for malignant neoplasm of breast: Secondary | ICD-10-CM | POA: Insufficient documentation

## 2018-02-21 DIAGNOSIS — F329 Major depressive disorder, single episode, unspecified: Secondary | ICD-10-CM

## 2018-02-21 DIAGNOSIS — Z1211 Encounter for screening for malignant neoplasm of colon: Secondary | ICD-10-CM | POA: Diagnosis not present

## 2018-02-21 DIAGNOSIS — F419 Anxiety disorder, unspecified: Secondary | ICD-10-CM | POA: Diagnosis not present

## 2018-02-21 DIAGNOSIS — I10 Essential (primary) hypertension: Secondary | ICD-10-CM | POA: Diagnosis not present

## 2018-02-21 DIAGNOSIS — R928 Other abnormal and inconclusive findings on diagnostic imaging of breast: Secondary | ICD-10-CM | POA: Diagnosis not present

## 2018-02-21 DIAGNOSIS — M069 Rheumatoid arthritis, unspecified: Secondary | ICD-10-CM

## 2018-02-21 DIAGNOSIS — F32A Depression, unspecified: Secondary | ICD-10-CM

## 2018-02-21 DIAGNOSIS — G4700 Insomnia, unspecified: Secondary | ICD-10-CM

## 2018-02-21 MED ORDER — TRAMADOL HCL 50 MG PO TABS: 50.0000 mg | ORAL_TABLET | Freq: Three times a day (TID) | ORAL | 0 refills | Status: DC | PRN

## 2018-02-21 MED ORDER — LORAZEPAM 1 MG PO TABS: 1.0000 mg | ORAL_TABLET | Freq: Every day | ORAL | 0 refills | Status: DC

## 2018-02-21 NOTE — Assessment & Plan Note (Signed)
Noted that her prior mammogram was abnormal.  Orders placed for repeat.

## 2018-02-21 NOTE — Assessment & Plan Note (Signed)
Stable on Ativan.  Refill given.  Discussed drowsy precautions.  Controlled substance database reviewed.

## 2018-02-21 NOTE — Assessment & Plan Note (Signed)
Asymptomatic.  She will continue to monitor.

## 2018-02-21 NOTE — Assessment & Plan Note (Signed)
Due early next year.  Refer to GI.

## 2018-02-21 NOTE — Progress Notes (Signed)
Tommi Rumps, MD Phone: [phone]  Audrey Hamilton is a 72 y.o. female who presents today for f/u.  CC: htn, anxiety/depression, RA  Hypertension: Not checking at home.  Taking amlodipine.  No chest pain, shortness breath, or edema.  Anxiety/depression: She denies any anxiety or depression.  No SI.  Insomnia: Patient is chronically on Ativan.  She notes if she does not take that she does not sleep at all.  She gets good sleep with it.  She is not drowsy the next day with it.  She does not drink alcohol.  Rheumatoid arthritis: She continues to follow with rheumatology.  Her disease is well controlled though she does have pain in her hands.  She takes tramadol twice daily and it does help take the edge off.  No drowsiness with this.  Social History   Tobacco Use  Smoking Status Former Smoker  . Last attempt to quit: 06/19/1995  . Years since quitting: 22.6  Smokeless Tobacco Never Used     ROS see history of present illness  Objective  Physical Exam Vitals:   02/21/18 1537 02/21/18 1608  BP: (!) 150/92 (!) 162/100  Pulse: 78   Temp: 98.4 F (36.9 C)   SpO2: 95%     BP Readings from Last 3 Encounters:  02/21/18 (!) 162/100  10/27/17 122/82  08/25/17 124/82   Wt Readings from Last 3 Encounters:  02/21/18 167 lb 9.6 oz (76 kg)  10/27/17 159 lb (72.1 kg)  07/05/17 154 lb 9.6 oz (70.1 kg)    Physical Exam  Constitutional: No distress.  Cardiovascular: Normal rate, regular rhythm and normal heart sounds.  Pulmonary/Chest: Effort normal and breath sounds normal.  Musculoskeletal: She exhibits no edema.  Neurological: She is alert.  Skin: Skin is warm and dry. She is not diaphoretic.  No apparent joint swelling or tenderness in her hands bilaterally   Assessment/Plan: Please see individual problem list.  Essential hypertension Elevated on amlodipine.  She will start checking at home.  She will return in 1 week for BP check with nursing.  Rheumatoid  arthritis ([place]) Stable.  She will continue to see rheumatology.  Tramadol refilled.  Controlled substance database reviewed.  Abnormal mammogram Noted that her prior mammogram was abnormal.  Orders placed for repeat.  Anxiety and depression Asymptomatic.  She will continue to monitor.  Colon cancer screening Due early next year.  Refer to GI.  Insomnia Stable on Ativan.  Refill given.  Discussed drowsy precautions.  Controlled substance database reviewed.   Orders Placed This Encounter  Procedures  . MM DIAG BREAST TOMO BILATERAL    Standing Status:   Future    Standing Expiration Date:   04/24/2019    Order Specific Question:   Reason for Exam (SYMPTOM  OR DIAGNOSIS REQUIRED)    Answer:   prior abnormal mammogram    Order Specific Question:   Preferred imaging location?    Answer:   [facility]  . US BREAST LTD UNI LEFT INC AXILLA    Standing Status:   Future    Standing Expiration Date:   04/24/2019    Order Specific Question:   Reason for Exam (SYMPTOM  OR DIAGNOSIS REQUIRED)    Answer:   abnormal mammogram    Order Specific Question:   Preferred imaging location?    Answer:   [facility]  . Ambulatory referral to Gastroenterology    Referral Priority:   Routine    Referral Type:   Consultation  Referral Reason:   Specialty Services Required    Number of Visits Requested:   1    Meds ordered this encounter  Medications  . LORazepam (ATIVAN) 1 MG tablet    Sig: Take 1 tablet (1 mg total) by mouth at bedtime.    Dispense:  30 tablet    Refill:  0  . traMADol (ULTRAM) 50 MG tablet    Sig: Take 1 tablet (50 mg total) by mouth every 8 (eight) hours as needed.    Dispense:  90 tablet    Refill:  0    Not to exceed 4 additional fills before 04/18/2018     Tommi Rumps, MD [place]

## 2018-02-21 NOTE — Assessment & Plan Note (Signed)
Elevated on amlodipine.  She will start checking at home.  She will return in 1 week for BP check with nursing.

## 2018-02-21 NOTE — Patient Instructions (Signed)
Nice to see you. Please start checking your blood pressure several days a week. I have refilled your medications.  Please watch for drowsiness with the Ativan and tramadol.  If that occurs please do not drive.

## 2018-02-21 NOTE — Assessment & Plan Note (Signed)
Stable.  She will continue to see rheumatology.  Tramadol refilled.  Controlled substance database reviewed.

## 2018-02-24 ENCOUNTER — Other Ambulatory Visit: Payer: Self-pay | Admitting: Family Medicine

## 2018-02-28 DIAGNOSIS — M62830 Muscle spasm of back: Secondary | ICD-10-CM | POA: Diagnosis not present

## 2018-02-28 DIAGNOSIS — M5033 Other cervical disc degeneration, cervicothoracic region: Secondary | ICD-10-CM | POA: Diagnosis not present

## 2018-02-28 DIAGNOSIS — M9901 Segmental and somatic dysfunction of cervical region: Secondary | ICD-10-CM | POA: Diagnosis not present

## 2018-02-28 DIAGNOSIS — M9902 Segmental and somatic dysfunction of thoracic region: Secondary | ICD-10-CM | POA: Diagnosis not present

## 2018-03-02 ENCOUNTER — Ambulatory Visit (INDEPENDENT_AMBULATORY_CARE_PROVIDER_SITE_OTHER): Payer: Medicare Other | Admitting: *Deleted

## 2018-03-02 VITALS — BP 168/90 | HR 72 | Resp 18

## 2018-03-02 DIAGNOSIS — I10 Essential (primary) hypertension: Secondary | ICD-10-CM | POA: Diagnosis not present

## 2018-03-02 NOTE — Progress Notes (Signed)
Patient here for nurse visit BP check per order from 02/21/18.   Patient reports compliance with prescribed BP medications: yes, patient has list of   Last dose of BP medication:  Amlodipine 5 mg at 7 am.  BP Readings from Last 3 Encounters:  03/02/18 (!) 160/88  02/21/18 (!) 162/100  10/27/17 122/82   Pulse Readings from Last 3 Encounters:  03/02/18 72  02/21/18 78  10/27/17 69   Patient denied any symptoms with increased BP readings.   Patient verbalized understanding of instructions.   Kerin Salen, LPN

## 2018-03-03 MED ORDER — AMLODIPINE BESYLATE 10 MG PO TABS: 10.0000 mg | ORAL_TABLET | Freq: Every day | ORAL | 1 refills | Status: DC

## 2018-03-03 NOTE — Progress Notes (Signed)
Bp is uncontrolled. I would like to increase her amlodipine to 10 mg once daily. Please send a new prescription to the pharmacy and get her set up for a recheck of her BP in one month with a nurse visit.

## 2018-03-03 NOTE — Addendum Note (Signed)
Addended by: Nanci Pina on: 03/03/2018 04:08 PM   Modules accepted: Orders

## 2018-03-03 NOTE — Progress Notes (Signed)
Medication sent to pharmacy patient aware and voiced understanding as per PCP.

## 2018-03-16 ENCOUNTER — Other Ambulatory Visit: Payer: Medicare Other

## 2018-03-24 ENCOUNTER — Other Ambulatory Visit: Payer: Self-pay | Admitting: Family Medicine

## 2018-03-25 NOTE — Telephone Encounter (Signed)
Controlled substance database reviewed. Sent to pharmacy.   

## 2018-04-01 ENCOUNTER — Ambulatory Visit
Admission: RE | Admit: 2018-04-01 | Discharge: 2018-04-01 | Disposition: A | Discharge status: Home / Self Care | Payer: Medicare Other | Source: Ambulatory Visit | Attending: Family Medicine | Admitting: Family Medicine

## 2018-04-01 DIAGNOSIS — R928 Other abnormal and inconclusive findings on diagnostic imaging of breast: Secondary | ICD-10-CM | POA: Diagnosis not present

## 2018-04-04 ENCOUNTER — Other Ambulatory Visit: Payer: Self-pay

## 2018-04-05 ENCOUNTER — Ambulatory Visit: Payer: Medicare Other

## 2018-04-05 DIAGNOSIS — I10 Essential (primary) hypertension: Secondary | ICD-10-CM

## 2018-04-05 DIAGNOSIS — Z0130 Encounter for examination of blood pressure without abnormal findings: Secondary | ICD-10-CM

## 2018-04-05 NOTE — Progress Notes (Addendum)
Patient needs additional blood pressure medication. Please contact her to advise her of this. She will need to come to the office for lab work in the next 1-2 days to check kidney function and electrolytes so I can determine what medication is most appropriate. Order has been placed. Please contact her to get her set up for labs. She additionally needs to check her BP at home daily.

## 2018-04-05 NOTE — Addendum Note (Signed)
Addended by: Leone Haven on: 04/05/2018 03:47 PM   Modules accepted: Orders

## 2018-04-05 NOTE — Progress Notes (Signed)
Pt is here today for 1 month follow up. Pt has been taking the amlodipine 10 MG daily and seems to be doing well on increased dose.   Pt has NOT been checking her BP at home.   Sitting , normal size cuff   LA:164/100, P:71, O2: 96 %   RA: 170/110, P:75, O2: 94 %   Sent to [facility] since PCP is out of the office.

## 2018-04-05 NOTE — Progress Notes (Signed)
Patient denies any issues; no CP, SOB, palpitations, leg swelling, cough,  Or feeling faint/dizzy.   Patient feels fine to leave clinic.  Will forward to PCP for any med changes.

## 2018-04-06 DIAGNOSIS — M5033 Other cervical disc degeneration, cervicothoracic region: Secondary | ICD-10-CM | POA: Diagnosis not present

## 2018-04-06 DIAGNOSIS — M62830 Muscle spasm of back: Secondary | ICD-10-CM | POA: Diagnosis not present

## 2018-04-06 DIAGNOSIS — M9902 Segmental and somatic dysfunction of thoracic region: Secondary | ICD-10-CM | POA: Diagnosis not present

## 2018-04-06 DIAGNOSIS — M9901 Segmental and somatic dysfunction of cervical region: Secondary | ICD-10-CM | POA: Diagnosis not present

## 2018-04-14 NOTE — Progress Notes (Signed)
Called and spoke with patient. Pt advised and voiced understanding. Pt will be coming in for lab work on 12//30/2019.

## 2018-04-18 ENCOUNTER — Telehealth: Payer: Self-pay | Admitting: Family Medicine

## 2018-04-18 ENCOUNTER — Other Ambulatory Visit (INDEPENDENT_AMBULATORY_CARE_PROVIDER_SITE_OTHER): Payer: Medicare Other

## 2018-04-18 DIAGNOSIS — R7309 Other abnormal glucose: Secondary | ICD-10-CM

## 2018-04-18 DIAGNOSIS — I10 Essential (primary) hypertension: Secondary | ICD-10-CM

## 2018-04-18 DIAGNOSIS — E876 Hypokalemia: Secondary | ICD-10-CM

## 2018-04-18 LAB — BASIC METABOLIC PANEL
BUN: 8 mg/dL (ref 6–23)
CALCIUM: 9 mg/dL (ref 8.4–10.5)
CO2: 27 mEq/L (ref 19–32)
Chloride: 103 mEq/L (ref 96–112)
Creatinine, Ser: 0.57 mg/dL (ref 0.40–1.20)
GFR: 110.85 mL/min (ref >60.00)
Glucose, Bld: 178 mg/dL — ABNORMAL HIGH (ref 70–99)
Potassium: 3.1 mEq/L — ABNORMAL LOW (ref 3.5–5.1)
SODIUM: 139 meq/L (ref 135–145)

## 2018-04-18 MED ORDER — POTASSIUM CHLORIDE CRYS ER 20 MEQ PO TBCR: 20.0000 meq | EXTENDED_RELEASE_TABLET | Freq: Every day | ORAL | 0 refills | Status: DC

## 2018-04-18 NOTE — Telephone Encounter (Signed)
Spoke with patient regarding low potassium and elevated blood sugar.  We will supplement her potassium.  We will have her come back on Thursday or Friday for lab work.  We will have her do a nurse visit or see myself or Lauren for evaluation.  The patient does not endorse any palpitations or chest pain.  She does report some fatigue since starting on the blood pressure medication.

## 2018-04-19 ENCOUNTER — Other Ambulatory Visit: Payer: Self-pay | Admitting: Family Medicine

## 2018-04-19 NOTE — Addendum Note (Signed)
Addended by: Caryl Bis ERIC G on: 04/19/2018 11:51 AM   Modules accepted: Orders

## 2018-04-19 NOTE — Telephone Encounter (Signed)
Sent to PCP for approval.  

## 2018-04-19 NOTE — Telephone Encounter (Signed)
Controlled substance database reviewed. Sent to pharmacy.   

## 2018-04-19 NOTE — Telephone Encounter (Signed)
Please contact the patient and get her set up for labs on Thursday or Friday.  I would also like for her to have a visit for blood pressure check with myself, Lauren, or nursing sometime later this week or early next week.  Thanks.

## 2018-04-19 NOTE — Telephone Encounter (Signed)
Done pt has been scheduled for both sent to PCP.

## 2018-04-21 ENCOUNTER — Encounter: Payer: Self-pay | Admitting: *Deleted

## 2018-04-21 ENCOUNTER — Encounter: Payer: Self-pay | Admitting: Family Medicine

## 2018-04-21 ENCOUNTER — Other Ambulatory Visit (INDEPENDENT_AMBULATORY_CARE_PROVIDER_SITE_OTHER): Payer: Medicare Other

## 2018-04-21 ENCOUNTER — Ambulatory Visit (INDEPENDENT_AMBULATORY_CARE_PROVIDER_SITE_OTHER): Payer: Medicare Other | Admitting: *Deleted

## 2018-04-21 VITALS — BP 138/84 | HR 75 | Resp 18

## 2018-04-21 DIAGNOSIS — E876 Hypokalemia: Secondary | ICD-10-CM

## 2018-04-21 DIAGNOSIS — R7309 Other abnormal glucose: Secondary | ICD-10-CM

## 2018-04-21 DIAGNOSIS — R7303 Prediabetes: Secondary | ICD-10-CM | POA: Insufficient documentation

## 2018-04-21 DIAGNOSIS — I10 Essential (primary) hypertension: Secondary | ICD-10-CM | POA: Diagnosis not present

## 2018-04-21 LAB — POTASSIUM: Potassium: 3.8 mEq/L (ref 3.5–5.1)

## 2018-04-21 LAB — HEMOGLOBIN A1C: HEMOGLOBIN A1C: 6.1 % (ref 4.6–6.5)

## 2018-04-21 NOTE — Progress Notes (Signed)
Patient here for nurse visit BP check per order from 04/18/18.   Patient reports compliance with prescribed BP medication: Yes  Last dose of BP medication: Amlodipine 10 mg at 7:30 AM  BP Readings from Last 3 Encounters:  04/21/18 138/84  03/02/18 (!) 168/90  02/21/18 (!) 162/100   Pulse Readings from Last 3 Encounters:  04/21/18 75  03/02/18 72  02/21/18 78      Patient verbalized understanding of instructions.   Kerin Salen, LPN

## 2018-04-22 NOTE — Progress Notes (Signed)
BP looks very good. Will forward to PCP.

## 2018-04-23 NOTE — Progress Notes (Signed)
BP has improved. Patient should continue with her current regimen and follow-up with me in the office in 3 months. Thanks.

## 2018-04-27 DIAGNOSIS — D509 Iron deficiency anemia, unspecified: Secondary | ICD-10-CM | POA: Diagnosis not present

## 2018-04-27 DIAGNOSIS — Z86010 Personal history of colonic polyps: Secondary | ICD-10-CM | POA: Diagnosis not present

## 2018-05-04 DIAGNOSIS — M9901 Segmental and somatic dysfunction of cervical region: Secondary | ICD-10-CM | POA: Diagnosis not present

## 2018-05-04 DIAGNOSIS — M5033 Other cervical disc degeneration, cervicothoracic region: Secondary | ICD-10-CM | POA: Diagnosis not present

## 2018-05-04 DIAGNOSIS — M9902 Segmental and somatic dysfunction of thoracic region: Secondary | ICD-10-CM | POA: Diagnosis not present

## 2018-05-04 DIAGNOSIS — M62830 Muscle spasm of back: Secondary | ICD-10-CM | POA: Diagnosis not present

## 2018-05-26 ENCOUNTER — Other Ambulatory Visit: Payer: Self-pay | Admitting: Family Medicine

## 2018-05-27 NOTE — Telephone Encounter (Signed)
Pt is returning Audrey Hamilton's call. Pt says that she only have 2 pills left, which will not cover her until Monday upon PCP return.

## 2018-05-27 NOTE — Telephone Encounter (Signed)
LMTCB to ask if patient has enough medication to last until Dr. Caryl Bis is back on Monday.

## 2018-06-01 ENCOUNTER — Encounter: Payer: Self-pay | Admitting: Family Medicine

## 2018-06-01 ENCOUNTER — Ambulatory Visit (INDEPENDENT_AMBULATORY_CARE_PROVIDER_SITE_OTHER): Payer: Medicare Other | Admitting: Family Medicine

## 2018-06-01 VITALS — BP 138/82 | HR 80 | Temp 98.1°F | Ht 64.0 in | Wt 168.8 lb

## 2018-06-01 DIAGNOSIS — G4700 Insomnia, unspecified: Secondary | ICD-10-CM

## 2018-06-01 DIAGNOSIS — F329 Major depressive disorder, single episode, unspecified: Secondary | ICD-10-CM

## 2018-06-01 DIAGNOSIS — E785 Hyperlipidemia, unspecified: Secondary | ICD-10-CM

## 2018-06-01 DIAGNOSIS — F32A Depression, unspecified: Secondary | ICD-10-CM

## 2018-06-01 DIAGNOSIS — I10 Essential (primary) hypertension: Secondary | ICD-10-CM

## 2018-06-01 DIAGNOSIS — F419 Anxiety disorder, unspecified: Secondary | ICD-10-CM

## 2018-06-01 MED ORDER — LOSARTAN POTASSIUM 50 MG PO TABS: 50.0000 mg | ORAL_TABLET | Freq: Every day | ORAL | 3 refills | Status: DC

## 2018-06-01 MED ORDER — PRAVASTATIN SODIUM 40 MG PO TABS: 40.0000 mg | ORAL_TABLET | Freq: Every day | ORAL | 3 refills | Status: DC

## 2018-06-01 NOTE — Assessment & Plan Note (Signed)
Issues tolerating Crestor.  Will discontinue Crestor and trial pravastatin.  Lab work in 1 month.  She will let us know if she continues to have symptoms.

## 2018-06-01 NOTE — Assessment & Plan Note (Signed)
Asymptomatic.  We will resolve this issue. ?

## 2018-06-01 NOTE — Assessment & Plan Note (Signed)
Above goal at home.  We will add losartan.  She will return in 1 week for labs.  She will continue amlodipine.  1 month for BP check with nursing.

## 2018-06-01 NOTE — Patient Instructions (Signed)
Nice to see you. We are going to add losartan to your blood pressure medication regimen.  Please check your blood pressure daily.  We will have you return for labs in 1 week.  Your return in 1 month for a blood pressure check with our nurse. We will change you from Crestor to pravastatin.  Please discontinue the Crestor.  If your muscle aches and fatigue do not improve with this change please let us know.  We will have you return in 1 month and check labs at that time.

## 2018-06-01 NOTE — Assessment & Plan Note (Signed)
Stable on lorazepam.

## 2018-06-01 NOTE — Progress Notes (Signed)
  Tommi Rumps, MD Phone: [phone]  Audrey Hamilton is a 73 y.o. female who presents today for follow-up.  CC: Hypertension, hyperlipidemia, anxiety/depression, sleep difficulty  Hypertension: She is checking at home typically running between 128-169/72-82.  No chest pain, shortness of breath, or edema.  She is taking amlodipine.  Hyperlipidemia: Taking Crestor.  No right upper quadrant pain.  She does note some chronic myalgias and fatigue since starting on the Crestor.  Anxiety/depression: Patient denies anxiety and depression.  Sleep difficulty: Notes this is stable.  She takes Ativan nightly.  She gets 6 to 7 hours of sleep with this.  No drowsiness.  No alcohol intake.  Social History   Tobacco Use  Smoking Status Former Smoker  . Last attempt to quit: 06/19/1995  . Years since quitting: 22.9  Smokeless Tobacco Never Used     ROS see history of present illness  Objective  Physical Exam Vitals:   06/01/18 1415  BP: 138/82  Pulse: 80  Temp: 98.1 F (36.7 C)  SpO2: 95%    BP Readings from Last 3 Encounters:  06/01/18 138/82  04/21/18 138/84  03/02/18 (!) 168/90   Wt Readings from Last 3 Encounters:  06/01/18 168 lb 12.8 oz (76.6 kg)  02/21/18 167 lb 9.6 oz (76 kg)  10/27/17 159 lb (72.1 kg)    Physical Exam Constitutional:      General: She is not in acute distress.    Appearance: She is not diaphoretic.  Cardiovascular:     Rate and Rhythm: Normal rate and regular rhythm.     Heart sounds: Normal heart sounds.  Pulmonary:     Effort: Pulmonary effort is normal.     Breath sounds: Normal breath sounds.  Musculoskeletal:     Right lower leg: No edema.     Left lower leg: No edema.  Skin:    General: Skin is warm and dry.  Neurological:     Mental Status: She is alert.      Assessment/Plan: Please see individual problem list.  Essential hypertension Above goal at home.  We will add losartan.  She will return in 1 week for labs.  She  will continue amlodipine.  1 month for BP check with nursing.  Anxiety and depression Asymptomatic.  We will resolve this issue.  Insomnia Stable on lorazepam.  HLD (hyperlipidemia) Issues tolerating Crestor.  Will discontinue Crestor and trial pravastatin.  Lab work in 1 month.  She will let us know if she continues to have symptoms.    Orders Placed This Encounter  Procedures  . Basic Metabolic Panel (BMET)    Standing Status:   Future    Standing Expiration Date:   06/02/2019    Meds ordered this encounter  Medications  . losartan (COZAAR) 50 MG tablet    Sig: Take 1 tablet (50 mg total) by mouth daily.    Dispense:  90 tablet    Refill:  3  . pravastatin (PRAVACHOL) 40 MG tablet    Sig: Take 1 tablet (40 mg total) by mouth daily.    Dispense:  90 tablet    Refill:  [place], MD [place]

## 2018-06-02 ENCOUNTER — Other Ambulatory Visit: Payer: Self-pay | Admitting: Family Medicine

## 2018-06-06 ENCOUNTER — Telehealth: Payer: Self-pay | Admitting: Family Medicine

## 2018-06-06 MED ORDER — LOSARTAN POTASSIUM 100 MG PO TABS: 50.0000 mg | ORAL_TABLET | Freq: Every day | ORAL | 1 refills | Status: DC

## 2018-06-06 NOTE — Telephone Encounter (Signed)
Called and spoke with patient. Pt advised and voiced understanding.  

## 2018-06-06 NOTE — Telephone Encounter (Signed)
Sent to PCP to advise of alternative due to medication being on back order.

## 2018-06-06 NOTE — Telephone Encounter (Signed)
I will send in losartan 100 mg tablets with the directions to take 50 mg by mouth daily.

## 2018-06-06 NOTE — Telephone Encounter (Signed)
Copied from [place] [id number]. Topic: Quick Communication - See Telephone Encounter >> Jun 06, 2018 11:01 AM Ivar Drape wrote: CRM for notification. See Telephone encounter for: 06/06/18. Patient was prescribed losartan (COZAAR) 50 MG tablet medication in addition to her present blood pressure meds but the pharmacy ([facility] on [address] says that medication is on a 70mth back order list and the surrounding pharmacies says the same thing.  She would like to know what to do.

## 2018-06-06 NOTE — Telephone Encounter (Signed)
Will route to office for final disposition; pt last seen by Dr Christin Bach, [place], 06/01/2018.

## 2018-06-08 ENCOUNTER — Other Ambulatory Visit (INDEPENDENT_AMBULATORY_CARE_PROVIDER_SITE_OTHER): Payer: Medicare Other

## 2018-06-08 DIAGNOSIS — M5033 Other cervical disc degeneration, cervicothoracic region: Secondary | ICD-10-CM | POA: Diagnosis not present

## 2018-06-08 DIAGNOSIS — M9901 Segmental and somatic dysfunction of cervical region: Secondary | ICD-10-CM | POA: Diagnosis not present

## 2018-06-08 DIAGNOSIS — M9902 Segmental and somatic dysfunction of thoracic region: Secondary | ICD-10-CM | POA: Diagnosis not present

## 2018-06-08 DIAGNOSIS — I10 Essential (primary) hypertension: Secondary | ICD-10-CM | POA: Diagnosis not present

## 2018-06-08 DIAGNOSIS — M62830 Muscle spasm of back: Secondary | ICD-10-CM | POA: Diagnosis not present

## 2018-06-08 LAB — BASIC METABOLIC PANEL
BUN: 8 mg/dL (ref 6–23)
CO2: 29 mEq/L (ref 19–32)
CREATININE: 0.51 mg/dL (ref 0.40–1.20)
Calcium: 9.1 mg/dL (ref 8.4–10.5)
Chloride: 103 mEq/L (ref 96–112)
GFR: 118.53 mL/min (ref >60.00)
Glucose, Bld: 72 mg/dL (ref 70–99)
Potassium: 3.4 mEq/L — ABNORMAL LOW (ref 3.5–5.1)
SODIUM: 140 meq/L (ref 135–145)

## 2018-06-22 ENCOUNTER — Other Ambulatory Visit: Payer: Self-pay | Admitting: Family Medicine

## 2018-06-22 NOTE — Telephone Encounter (Signed)
Last OV 06/01/2018  Last refilled 05/27/2018 disp 30 with no refills   Pt stated that she has enough to last her until next week till Wednesday then she will be due for a refill.  Sent to PCP for approval

## 2018-06-26 NOTE — Telephone Encounter (Signed)
Controlled substance database reviewed. Sent to pharmacy.   

## 2018-07-04 ENCOUNTER — Encounter
Admission: RE | Disposition: A | Discharge status: Home / Self Care | Payer: Self-pay | Source: Home / Self Care | Attending: Unknown Physician Specialty

## 2018-07-04 ENCOUNTER — Encounter: Payer: Self-pay | Admitting: Certified Registered Nurse Anesthetist

## 2018-07-04 ENCOUNTER — Other Ambulatory Visit: Payer: Self-pay

## 2018-07-04 ENCOUNTER — Ambulatory Visit
Admission: RE | Admit: 2018-07-04 | Discharge: 2018-07-04 | Disposition: A | Discharge status: Home / Self Care | Payer: Medicare Other | Attending: Unknown Physician Specialty | Admitting: Unknown Physician Specialty

## 2018-07-04 ENCOUNTER — Ambulatory Visit: Payer: Medicare Other | Admitting: Anesthesiology

## 2018-07-04 DIAGNOSIS — D123 Benign neoplasm of transverse colon: Secondary | ICD-10-CM | POA: Insufficient documentation

## 2018-07-04 DIAGNOSIS — E039 Hypothyroidism, unspecified: Secondary | ICD-10-CM | POA: Diagnosis not present

## 2018-07-04 DIAGNOSIS — K640 First degree hemorrhoids: Secondary | ICD-10-CM | POA: Insufficient documentation

## 2018-07-04 DIAGNOSIS — Z8379 Family history of other diseases of the digestive system: Secondary | ICD-10-CM | POA: Insufficient documentation

## 2018-07-04 DIAGNOSIS — Z1211 Encounter for screening for malignant neoplasm of colon: Secondary | ICD-10-CM | POA: Insufficient documentation

## 2018-07-04 DIAGNOSIS — Z886 Allergy status to analgesic agent status: Secondary | ICD-10-CM | POA: Diagnosis not present

## 2018-07-04 DIAGNOSIS — Z880 Allergy status to penicillin: Secondary | ICD-10-CM | POA: Insufficient documentation

## 2018-07-04 DIAGNOSIS — K648 Other hemorrhoids: Secondary | ICD-10-CM | POA: Diagnosis not present

## 2018-07-04 DIAGNOSIS — Z8261 Family history of arthritis: Secondary | ICD-10-CM | POA: Diagnosis not present

## 2018-07-04 DIAGNOSIS — Z809 Family history of malignant neoplasm, unspecified: Secondary | ICD-10-CM | POA: Insufficient documentation

## 2018-07-04 DIAGNOSIS — Z87891 Personal history of nicotine dependence: Secondary | ICD-10-CM | POA: Insufficient documentation

## 2018-07-04 DIAGNOSIS — Z86010 Personal history of colonic polyps: Secondary | ICD-10-CM | POA: Diagnosis not present

## 2018-07-04 DIAGNOSIS — M797 Fibromyalgia: Secondary | ICD-10-CM | POA: Diagnosis not present

## 2018-07-04 DIAGNOSIS — Z818 Family history of other mental and behavioral disorders: Secondary | ICD-10-CM | POA: Diagnosis not present

## 2018-07-04 DIAGNOSIS — E785 Hyperlipidemia, unspecified: Secondary | ICD-10-CM | POA: Diagnosis not present

## 2018-07-04 DIAGNOSIS — D126 Benign neoplasm of colon, unspecified: Secondary | ICD-10-CM | POA: Diagnosis not present

## 2018-07-04 DIAGNOSIS — Z833 Family history of diabetes mellitus: Secondary | ICD-10-CM | POA: Insufficient documentation

## 2018-07-04 DIAGNOSIS — Z888 Allergy status to other drugs, medicaments and biological substances status: Secondary | ICD-10-CM | POA: Diagnosis not present

## 2018-07-04 DIAGNOSIS — Z882 Allergy status to sulfonamides status: Secondary | ICD-10-CM | POA: Insufficient documentation

## 2018-07-04 DIAGNOSIS — Z8711 Personal history of peptic ulcer disease: Secondary | ICD-10-CM | POA: Insufficient documentation

## 2018-07-04 DIAGNOSIS — M069 Rheumatoid arthritis, unspecified: Secondary | ICD-10-CM | POA: Insufficient documentation

## 2018-07-04 DIAGNOSIS — K5790 Diverticulosis of intestine, part unspecified, without perforation or abscess without bleeding: Secondary | ICD-10-CM | POA: Diagnosis not present

## 2018-07-04 DIAGNOSIS — E876 Hypokalemia: Secondary | ICD-10-CM | POA: Insufficient documentation

## 2018-07-04 DIAGNOSIS — K5730 Diverticulosis of large intestine without perforation or abscess without bleeding: Secondary | ICD-10-CM | POA: Insufficient documentation

## 2018-07-04 DIAGNOSIS — Z79899 Other long term (current) drug therapy: Secondary | ICD-10-CM | POA: Insufficient documentation

## 2018-07-04 DIAGNOSIS — Z90710 Acquired absence of both cervix and uterus: Secondary | ICD-10-CM | POA: Insufficient documentation

## 2018-07-04 DIAGNOSIS — D649 Anemia, unspecified: Secondary | ICD-10-CM | POA: Insufficient documentation

## 2018-07-04 DIAGNOSIS — I10 Essential (primary) hypertension: Secondary | ICD-10-CM | POA: Diagnosis not present

## 2018-07-04 DIAGNOSIS — F419 Anxiety disorder, unspecified: Secondary | ICD-10-CM | POA: Diagnosis not present

## 2018-07-04 DIAGNOSIS — Z7982 Long term (current) use of aspirin: Secondary | ICD-10-CM | POA: Diagnosis not present

## 2018-07-04 DIAGNOSIS — D124 Benign neoplasm of descending colon: Secondary | ICD-10-CM | POA: Diagnosis not present

## 2018-07-04 DIAGNOSIS — F418 Other specified anxiety disorders: Secondary | ICD-10-CM | POA: Diagnosis not present

## 2018-07-04 DIAGNOSIS — K635 Polyp of colon: Secondary | ICD-10-CM | POA: Diagnosis not present

## 2018-07-04 DIAGNOSIS — F329 Major depressive disorder, single episode, unspecified: Secondary | ICD-10-CM | POA: Diagnosis not present

## 2018-07-04 HISTORY — PX: COLONOSCOPY WITH PROPOFOL: SHX5780

## 2018-07-04 LAB — HM COLONOSCOPY

## 2018-07-04 SURGERY — COLONOSCOPY WITH PROPOFOL
Anesthesia: General

## 2018-07-04 MED ORDER — LIDOCAINE HCL (PF) 1 % IJ SOLN
INTRAMUSCULAR | Status: AC
  Administered 2018-07-04: 0.3 mL via INTRADERMAL
  Filled 2018-07-04: qty 2

## 2018-07-04 MED ORDER — LIDOCAINE HCL (CARDIAC) PF 100 MG/5ML IV SOSY
PREFILLED_SYRINGE | INTRAVENOUS | Status: DC | PRN
  Administered 2018-07-04: 50 mg via INTRAVENOUS

## 2018-07-04 MED ORDER — LIDOCAINE HCL (PF) 1 % IJ SOLN
2.0000 mL | Freq: Once | INTRAMUSCULAR | Status: AC
  Administered 2018-07-04: 0.3 mL via INTRADERMAL

## 2018-07-04 MED ORDER — SODIUM CHLORIDE 0.9 % IV SOLN
INTRAVENOUS | Status: DC
  Administered 2018-07-04: 1000 mL via INTRAVENOUS

## 2018-07-04 MED ORDER — PROPOFOL 10 MG/ML IV BOLUS
INTRAVENOUS | Status: DC | PRN
  Administered 2018-07-04: 70 mg via INTRAVENOUS

## 2018-07-04 MED ORDER — PROPOFOL 500 MG/50ML IV EMUL
INTRAVENOUS | Status: DC | PRN
  Administered 2018-07-04: 130 ug/kg/min via INTRAVENOUS

## 2018-07-04 MED ORDER — SODIUM CHLORIDE 0.9 % IV SOLN: INTRAVENOUS | Status: DC

## 2018-07-04 NOTE — Op Note (Signed)
[facility] Gastroenterology Patient Name: Audrey Hamilton Procedure Date: 07/04/2018 8:40 AM MRN: [id number] Account #: [id number] Date of Birth: 1945-11-06 Admit Type: Outpatient Age: 73 Room: [facility] ENDO ROOM 1 Gender: Female Note Status: Finalized Procedure:            Colonoscopy Indications:          High risk colon cancer surveillance: Personal history                        of colonic polyps Providers:            Manya Silvas, MD Medicines:            Propofol per Anesthesia Complications:        No immediate complications. Procedure:            Pre-Anesthesia Assessment:                       - After reviewing the risks and benefits, the patient                        was deemed in satisfactory condition to undergo the                        procedure.                       After obtaining informed consent, the colonoscope was                        passed under direct vision. Throughout the procedure,                        the patient's blood pressure, pulse, and oxygen                        saturations were monitored continuously. The                        Colonoscope was introduced through the anus and                        advanced to the the cecum, identified by appendiceal                        orifice and ileocecal valve. The colonoscopy was                        performed without difficulty. The patient tolerated the                        procedure well. The quality of the bowel preparation                        was good. Findings:      Internal hemorrhoids were found during endoscopy. The hemorrhoids were       small and Grade I (internal hemorrhoids that do not prolapse).      Multiple small-mouthed diverticula were found in the sigmoid colon.      Two sessile polyps were found in the descending colon. The polyps were       small in  size. These polyps were removed with a cold snare. Resection       and retrieval were complete.    Two sessile polyps were found in the transverse colon. The polyps were       small in size. These polyps were removed with a cold snare. Resection       and retrieval were complete.      A small polyp was found in the transverse colon. The polyp was sessile.       The polyp was removed with a jumbo cold forceps. Resection and retrieval       were complete.      The exam was otherwise without abnormality. Impression:           - Internal hemorrhoids.                       - Diverticulosis in the sigmoid colon.                       - Two small polyps in the descending colon, removed                        with a cold snare. Resected and retrieved.                       - Two small polyps in the transverse colon, removed                        with a cold snare. Resected and retrieved.                       - One small polyp in the transverse colon, removed with                        a jumbo cold forceps. Resected and retrieved.                       - The examination was otherwise normal. Recommendation:       - Await pathology results. Manya Silvas, MD 07/04/2018 10:09:38 AM This report has been signed electronically. Number of Addenda: 0 Note Initiated On: 07/04/2018 8:40 AM Scope Withdrawal Time: 0 hours 8 minutes 23 seconds  Total Procedure Duration: 0 hours 29 minutes 13 seconds       [facility]

## 2018-07-04 NOTE — Anesthesia Post-op Follow-up Note (Signed)
Anesthesia QCDR form completed.        

## 2018-07-04 NOTE — Anesthesia Preprocedure Evaluation (Addendum)
Anesthesia Evaluation  Patient identified by MRN, date of birth, ID band Patient awake    Reviewed: Allergy & Precautions, H&P , NPO status , Patient's Chart, lab work & pertinent test results  History of Anesthesia Complications Negative for: history of anesthetic complications  Airway Mallampati: II  TM Distance: >3 FB     Dental  (+) Upper Dentures, Lower Dentures   Pulmonary neg COPD, former smoker,           Cardiovascular hypertension, (-) angina(-) Past MI, (-) Cardiac Stents and (-) CABG (-) dysrhythmias      Neuro/Psych PSYCHIATRIC DISORDERS Anxiety Depression negative neurological ROS     GI/Hepatic Neg liver ROS, PUD,   Endo/Other  negative endocrine ROS  Renal/GU negative Renal ROS  negative genitourinary   Musculoskeletal  (+) Arthritis , Rheumatoid disorders,  Fibromyalgia -  Abdominal   Peds  Hematology  (+) Blood dyscrasia, anemia ,   Anesthesia Other Findings Past Medical History: No date: Allergy No date: Anxiety     Comment:  controlled No date: Arthritis No date: Chicken pox No date: Colon polyps No date: Depression No date: Fibromyalgia No date: History of hypothyroidism No date: Hyperlipidemia No date: Hypokalemia No date: Multiple gastric ulcers Since 1995: Rheumatoid arthritis ([place])     Comment:  currently in a flare up  Past Surgical History: 1974: ABDOMINAL HYSTERECTOMY 1970's: AUGMENTATION MAMMAPLASTY; Bilateral     Comment:  silicone No date: EYE SURGERY 11/22/10: FOOT SURGERY; Left 1970's: MASTECTOMY; Bilateral     Comment:  SUBCUTANEOUS MASTECTOMY - NO CANCER 12/2001: SMALL INTESTINE SURGERY 12/2001: STOMACH SURGERY 1966: TONSILLECTOMY AND ADENOIDECTOMY No date: UPPER GASTROINTESTINAL ENDOSCOPY     Reproductive/Obstetrics negative OB ROS                            Anesthesia Physical Anesthesia Plan  ASA: III  Anesthesia Plan: General    Post-op Pain Management:    Induction:   PONV Risk Score and Plan: Propofol infusion and TIVA  Airway Management Planned: Nasal Cannula  Additional Equipment:   Intra-op Plan:   Post-operative Plan:   Informed Consent: I have reviewed the patients History and Physical, chart, labs and discussed the procedure including the risks, benefits and alternatives for the proposed anesthesia with the patient or authorized representative who has indicated his/her understanding and acceptance.     Dental Advisory Given  Plan Discussed with: Anesthesiologist and CRNA  Anesthesia Plan Comments:         Anesthesia Quick Evaluation

## 2018-07-04 NOTE — Anesthesia Postprocedure Evaluation (Signed)
Anesthesia Post Note  Patient: Audrey Hamilton  Procedure(s) Performed: COLONOSCOPY WITH PROPOFOL (N/A )  Patient location during evaluation: PACU Anesthesia Type: General Level of consciousness: awake and alert Pain management: pain level controlled Vital Signs Assessment: post-procedure vital signs reviewed and stable Respiratory status: spontaneous breathing, nonlabored ventilation and respiratory function stable Cardiovascular status: blood pressure returned to baseline and stable Postop Assessment: no apparent nausea or vomiting Anesthetic complications: no     Last Vitals:  Vitals:   07/04/18 1008 07/04/18 1038  BP: (!) 117/59 120/74  Pulse:    Resp: 15   Temp: (!) 36.1 C   SpO2:      Last Pain:  Vitals:   07/04/18 1008  TempSrc: Tympanic  PainSc: 0-No pain                 Durenda Hurt

## 2018-07-04 NOTE — H&P (Signed)
Primary Care Physician:  Leone Haven, MD Primary Gastroenterologist:  Dr. Vira Agar  Pre-Procedure History & Physical: HPI:  Audrey Hamilton is a 73 y.o. female is here for an colonoscopy.   Past Medical History:  Diagnosis Date  . Allergy   . Anxiety    controlled  . Arthritis   . Chicken pox   . Colon polyps   . Depression   . Fibromyalgia   . History of hypothyroidism   . Hyperlipidemia   . Hypokalemia   . Multiple gastric ulcers   . Rheumatoid arthritis ([place]) Since 1995   currently in a flare up    Past Surgical History:  Procedure Laterality Date  . ABDOMINAL HYSTERECTOMY  1974  . AUGMENTATION MAMMAPLASTY Bilateral 6387'F   silicone  . EYE SURGERY    . FOOT SURGERY Left 11/22/10  . MASTECTOMY Bilateral 1970's   SUBCUTANEOUS MASTECTOMY - NO CANCER  . SMALL INTESTINE SURGERY  12/2001  . STOMACH SURGERY  12/2001  . TONSILLECTOMY AND ADENOIDECTOMY  1966  . UPPER GASTROINTESTINAL ENDOSCOPY      Prior to Admission medications   Medication Sig Start Date End Date Taking? Authorizing Provider  amLODipine (NORVASC) 10 MG tablet Take 1 tablet (10 mg total) by mouth daily. 03/03/18   Leone Haven, MD  Ferrous Fum-Iron Polysacch (TANDEM PO) Take 106 mg by mouth daily.    [provider]  fluticasone (FLONASE) 50 MCG/ACT nasal spray Place into the nose at bedtime. Reported on 10/01/2015 05/14/15   [provider]  leflunomide (ARAVA) 10 MG tablet 1 tab daily x 90 days 01/06/16   [provider]  Levocetirizine Dihydrochloride (XYZAL ALLERGY 24HR PO) Take 1 tablet by mouth daily.    [provider]  LORazepam (ATIVAN) 1 MG tablet TAKE 1 TABLET (1 MG TOTAL) BY MOUTH AT BEDTIME. 06/26/18   Leone Haven, MD  losartan (COZAAR) 100 MG tablet Take 0.5 tablets (50 mg total) by mouth daily. 06/06/18   Leone Haven, MD  montelukast (SINGULAIR) 10 MG tablet TAKE 1 TABLET BY MOUTH EVERYDAY AT BEDTIME 05/26/18   Leone Haven, MD   omeprazole (PRILOSEC) 20 MG capsule Take by mouth.    [provider]  potassium chloride SA (K-DUR,KLOR-CON) 20 MEQ tablet Take 1 tablet (20 mEq total) by mouth daily. 04/18/18   Leone Haven, MD  pravastatin (PRAVACHOL) 40 MG tablet Take 1 tablet (40 mg total) by mouth daily. 06/01/18   Leone Haven, MD  traMADol (ULTRAM) 50 MG tablet TAKE 1 TABLET (50 MG TOTAL) BY MOUTH EVERY 8 (EIGHT) HOURS AS NEEDED. 06/02/18   Leone Haven, MD    Allergies as of 04/28/2018 - Review Complete 02/21/2018  Allergen Reaction Noted  . Amoxicillin-pot clavulanate  07/01/2015  . Aspirin  07/01/2015  . Fluconazole  07/01/2015  . Oxaprozin  07/01/2015  . Sulfa antibiotics  07/01/2015    Family History  Problem Relation Age of Onset  . Arthritis Mother   . Sudden death Mother        after birth of pt who was 30 days old.  . Depression Mother   . Arthritis Maternal Grandmother   . Cancer Maternal Grandmother   . Hyperlipidemia Maternal Grandfather   . Diabetes Mellitus II Father   . Liver disease Father   . Breast cancer Neg Hx     Social History   Socioeconomic History  . Marital status: Widowed    Spouse name: Not on  file  . Number of children: Not on file  . Years of education: Not on file  . Highest education level: Not on file  Occupational History  . Not on file  Social Needs  . Financial resource strain: Not hard at all  . Food insecurity:    Worry: Never true    Inability: Never true  . Transportation needs:    Medical: No    Non-medical: No  Tobacco Use  . Smoking status: Former Smoker    Last attempt to quit: 06/19/1995    Years since quitting: 23.0  . Smokeless tobacco: Never Used  Substance and Sexual Activity  . Alcohol use: No    Alcohol/week: 0.0 - 1.0 standard drinks  . Drug use: No  . Sexual activity: Not on file  Lifestyle  . Physical activity:    Days per week: Not on file    Minutes per session: Not on file  . Stress: Not at all   Relationships  . Social connections:    Talks on phone: Not on file    Gets together: Not on file    Attends religious service: Not on file    Active member of club or organization: Not on file    Attends meetings of clubs or organizations: Not on file    Relationship status: Not on file  . Intimate partner violence:    Fear of current or ex partner: Not on file    Emotionally abused: Not on file    Physically abused: Not on file    Forced sexual activity: Not on file  Other Topics Concern  . Not on file  Social History Narrative  . Not on file    Review of Systems: See HPI, otherwise negative ROS  Physical Exam: BP (!) 162/92   Pulse 76   Temp 98.3 F (36.8 C)   Resp 18   Ht 5\' 4"  (1.626 m)   Wt 76.7 kg   SpO2 100%   BMI 29.01 kg/m  General:   Alert,  pleasant and cooperative in NAD Head:  Normocephalic and atraumatic. Neck:  Supple; no masses or thyromegaly. Lungs:  Clear throughout to auscultation.    Heart:  Regular rate and rhythm. Abdomen:  Soft, nontender and nondistended. Normal bowel sounds, without guarding, and without rebound.   Neurologic:  Alert and  oriented x4;  grossly normal neurologically.  Impression/Plan: Audrey Hamilton is here for an colonoscopy to be performed for [facility] colon polyps.  Risks, benefits, limitations, and alternatives regarding  colonoscopy have been reviewed with the patient.  Questions have been answered.  All parties agreeable.   Gaylyn Cheers, MD  07/04/2018, 9:22 AM

## 2018-07-04 NOTE — Anesthesia Procedure Notes (Deleted)
Performed by: Janila Arrazola, CRNA       

## 2018-07-04 NOTE — Transfer of Care (Signed)
Immediate Anesthesia Transfer of Care Note  Patient: Audrey Hamilton  Procedure(s) Performed: COLONOSCOPY WITH PROPOFOL (N/A )  Patient Location: PACU and Endoscopy Unit  Anesthesia Type:General  Level of Consciousness: awake, alert , oriented and patient cooperative  Airway & Oxygen Therapy: Patient Spontanous Breathing  Post-op Assessment: Report given to RN and Post -op Vital signs reviewed and stable  Post vital signs: Reviewed and stable  Last Vitals:  Vitals Value Taken Time  BP 117/59 07/04/2018 10:10 AM  Temp 36.1 C 07/04/2018 10:08 AM  Pulse 65 07/04/2018 10:10 AM  Resp 26 07/04/2018 10:10 AM  SpO2 98 % 07/04/2018 10:10 AM  Vitals shown include unvalidated device data.  Last Pain:  Vitals:   07/04/18 1008  TempSrc: Tympanic  PainSc: 0-No pain         Complications: No apparent anesthesia complications

## 2018-07-05 LAB — SURGICAL PATHOLOGY

## 2018-07-06 ENCOUNTER — Telehealth: Payer: Self-pay | Admitting: Radiology

## 2018-07-06 DIAGNOSIS — M62830 Muscle spasm of back: Secondary | ICD-10-CM | POA: Diagnosis not present

## 2018-07-06 DIAGNOSIS — E785 Hyperlipidemia, unspecified: Secondary | ICD-10-CM

## 2018-07-06 DIAGNOSIS — M9901 Segmental and somatic dysfunction of cervical region: Secondary | ICD-10-CM | POA: Diagnosis not present

## 2018-07-06 DIAGNOSIS — M5033 Other cervical disc degeneration, cervicothoracic region: Secondary | ICD-10-CM | POA: Diagnosis not present

## 2018-07-06 DIAGNOSIS — M9902 Segmental and somatic dysfunction of thoracic region: Secondary | ICD-10-CM | POA: Diagnosis not present

## 2018-07-06 NOTE — Telephone Encounter (Signed)
Pt coming in for labs tomorrow, please place future orders. Thank you.  

## 2018-07-06 NOTE — Addendum Note (Signed)
Addended by: Caryl Bis, ERIC G on: 07/06/2018 11:09 AM   Modules accepted: Orders

## 2018-07-07 ENCOUNTER — Ambulatory Visit (INDEPENDENT_AMBULATORY_CARE_PROVIDER_SITE_OTHER): Payer: Medicare Other

## 2018-07-07 ENCOUNTER — Other Ambulatory Visit (INDEPENDENT_AMBULATORY_CARE_PROVIDER_SITE_OTHER): Payer: Medicare Other

## 2018-07-07 ENCOUNTER — Other Ambulatory Visit: Payer: Self-pay

## 2018-07-07 VITALS — BP 140/80

## 2018-07-07 DIAGNOSIS — E785 Hyperlipidemia, unspecified: Secondary | ICD-10-CM | POA: Diagnosis not present

## 2018-07-07 DIAGNOSIS — I10 Essential (primary) hypertension: Secondary | ICD-10-CM | POA: Diagnosis not present

## 2018-07-07 LAB — LDL CHOLESTEROL, DIRECT: Direct LDL: 177 mg/dL

## 2018-07-07 LAB — HEPATIC FUNCTION PANEL
ALT: 15 U/L (ref 0–35)
AST: 15 U/L (ref 0–37)
Albumin: 3.9 g/dL (ref 3.5–5.2)
Alkaline Phosphatase: 74 U/L (ref 39–117)
BILIRUBIN DIRECT: 0 mg/dL (ref 0.0–0.3)
BILIRUBIN TOTAL: 0.3 mg/dL (ref 0.2–1.2)
Total Protein: 7 g/dL (ref 6.0–8.3)

## 2018-07-07 NOTE — Progress Notes (Signed)
Patient here for nurse visit BP check per order from [place].   Patient reports compliance with prescribed BP medications: yes  Last dose of BP medication: this morning  BP Readings from Last 3 Encounters:  07/04/18 120/74  06/01/18 138/82  04/21/18 138/84   Pulse Readings from Last 3 Encounters:  07/04/18 76  06/01/18 80  04/21/18 75    Per Dr. Caryl Bis  Patient verbalized understanding of instructions.   Gordy Councilman, CMA

## 2018-07-08 ENCOUNTER — Telehealth: Payer: Self-pay | Admitting: Family Medicine

## 2018-07-08 NOTE — Telephone Encounter (Signed)
Pt returning call to receive lab results.   Copied from [place] [id number]. Topic: Quick Communication - Lab Results (Clinic Use ONLY) >> Jul 08, 2018  1:07 PM [place] wrote: Called patient to inform them of  lab results. When patient returns call, triage nurse may disclose results.

## 2018-07-08 NOTE — Telephone Encounter (Signed)
Called pt and left message to call back for results   

## 2018-07-12 ENCOUNTER — Other Ambulatory Visit: Payer: Self-pay | Admitting: Family Medicine

## 2018-07-12 DIAGNOSIS — E785 Hyperlipidemia, unspecified: Secondary | ICD-10-CM

## 2018-07-12 MED ORDER — ATORVASTATIN CALCIUM 20 MG PO TABS: 20.0000 mg | ORAL_TABLET | Freq: Every day | ORAL | 3 refills | Status: DC

## 2018-07-18 ENCOUNTER — Other Ambulatory Visit: Payer: Self-pay | Admitting: Family Medicine

## 2018-07-21 ENCOUNTER — Other Ambulatory Visit: Payer: Self-pay | Admitting: Family Medicine

## 2018-07-21 NOTE — Progress Notes (Signed)
Patients BP is acceptable. She should continue her current regimen.

## 2018-08-03 DIAGNOSIS — M62830 Muscle spasm of back: Secondary | ICD-10-CM | POA: Diagnosis not present

## 2018-08-03 DIAGNOSIS — M5033 Other cervical disc degeneration, cervicothoracic region: Secondary | ICD-10-CM | POA: Diagnosis not present

## 2018-08-03 DIAGNOSIS — M9901 Segmental and somatic dysfunction of cervical region: Secondary | ICD-10-CM | POA: Diagnosis not present

## 2018-08-03 DIAGNOSIS — M9902 Segmental and somatic dysfunction of thoracic region: Secondary | ICD-10-CM | POA: Diagnosis not present

## 2018-08-15 DIAGNOSIS — M0609 Rheumatoid arthritis without rheumatoid factor, multiple sites: Secondary | ICD-10-CM | POA: Diagnosis not present

## 2018-08-15 DIAGNOSIS — M170 Bilateral primary osteoarthritis of knee: Secondary | ICD-10-CM | POA: Diagnosis not present

## 2018-08-15 DIAGNOSIS — Z79899 Other long term (current) drug therapy: Secondary | ICD-10-CM | POA: Diagnosis not present

## 2018-08-15 DIAGNOSIS — M797 Fibromyalgia: Secondary | ICD-10-CM | POA: Diagnosis not present

## 2018-08-21 ENCOUNTER — Other Ambulatory Visit: Payer: Self-pay | Admitting: Family Medicine

## 2018-08-22 NOTE — Telephone Encounter (Signed)
Last OV 06/01/2018  Last refilled   30 tablet 1 06/26/2018     Next OV 10/03/2018  Sent to PCP for approval

## 2018-08-25 ENCOUNTER — Other Ambulatory Visit: Payer: Self-pay | Admitting: Family Medicine

## 2018-08-26 ENCOUNTER — Other Ambulatory Visit: Payer: Self-pay

## 2018-08-26 ENCOUNTER — Other Ambulatory Visit (INDEPENDENT_AMBULATORY_CARE_PROVIDER_SITE_OTHER): Payer: Medicare Other

## 2018-08-26 DIAGNOSIS — E785 Hyperlipidemia, unspecified: Secondary | ICD-10-CM

## 2018-08-26 LAB — HEPATIC FUNCTION PANEL
ALT: 21 U/L (ref 0–35)
AST: 20 U/L (ref 0–37)
Albumin: 3.9 g/dL (ref 3.5–5.2)
Alkaline Phosphatase: 81 U/L (ref 39–117)
Bilirubin, Direct: 0 mg/dL (ref 0.0–0.3)
Total Bilirubin: 0.2 mg/dL (ref 0.2–1.2)
Total Protein: 7 g/dL (ref 6.0–8.3)

## 2018-08-26 LAB — LDL CHOLESTEROL, DIRECT: Direct LDL: 158 mg/dL

## 2018-08-31 DIAGNOSIS — M5033 Other cervical disc degeneration, cervicothoracic region: Secondary | ICD-10-CM | POA: Diagnosis not present

## 2018-08-31 DIAGNOSIS — M62830 Muscle spasm of back: Secondary | ICD-10-CM | POA: Diagnosis not present

## 2018-08-31 DIAGNOSIS — M9902 Segmental and somatic dysfunction of thoracic region: Secondary | ICD-10-CM | POA: Diagnosis not present

## 2018-08-31 DIAGNOSIS — M9901 Segmental and somatic dysfunction of cervical region: Secondary | ICD-10-CM | POA: Diagnosis not present

## 2018-09-01 ENCOUNTER — Other Ambulatory Visit: Payer: Self-pay | Admitting: Family Medicine

## 2018-09-01 DIAGNOSIS — E785 Hyperlipidemia, unspecified: Secondary | ICD-10-CM

## 2018-09-01 MED ORDER — PRAVASTATIN SODIUM 20 MG PO TABS: 20.0000 mg | ORAL_TABLET | Freq: Every day | ORAL | 3 refills | Status: DC

## 2018-09-02 ENCOUNTER — Other Ambulatory Visit: Payer: Self-pay

## 2018-09-02 MED ORDER — TRAMADOL HCL 50 MG PO TABS: 50.0000 mg | ORAL_TABLET | Freq: Three times a day (TID) | ORAL | 0 refills | Status: DC | PRN

## 2018-09-05 NOTE — Telephone Encounter (Signed)
Last OV 06/01/2018  Last refilled  traMADol (ULTRAM) 50 MG tablet 90 tablet 0 09/02/2018     Next OV 10/03/2018  Sent to PCP for approval

## 2018-09-05 NOTE — Telephone Encounter (Signed)
Previously refilled. 

## 2018-09-15 ENCOUNTER — Other Ambulatory Visit: Payer: Self-pay | Admitting: Family Medicine

## 2018-09-15 DIAGNOSIS — I10 Essential (primary) hypertension: Secondary | ICD-10-CM

## 2018-09-28 DIAGNOSIS — M62830 Muscle spasm of back: Secondary | ICD-10-CM | POA: Diagnosis not present

## 2018-09-28 DIAGNOSIS — M9901 Segmental and somatic dysfunction of cervical region: Secondary | ICD-10-CM | POA: Diagnosis not present

## 2018-09-28 DIAGNOSIS — M9902 Segmental and somatic dysfunction of thoracic region: Secondary | ICD-10-CM | POA: Diagnosis not present

## 2018-09-28 DIAGNOSIS — M5033 Other cervical disc degeneration, cervicothoracic region: Secondary | ICD-10-CM | POA: Diagnosis not present

## 2018-10-03 ENCOUNTER — Encounter: Payer: Self-pay | Admitting: Family Medicine

## 2018-10-03 ENCOUNTER — Ambulatory Visit (INDEPENDENT_AMBULATORY_CARE_PROVIDER_SITE_OTHER): Payer: Medicare Other | Admitting: Family Medicine

## 2018-10-03 ENCOUNTER — Other Ambulatory Visit: Payer: Self-pay

## 2018-10-03 DIAGNOSIS — R928 Other abnormal and inconclusive findings on diagnostic imaging of breast: Secondary | ICD-10-CM | POA: Diagnosis not present

## 2018-10-03 DIAGNOSIS — I10 Essential (primary) hypertension: Secondary | ICD-10-CM

## 2018-10-03 DIAGNOSIS — D509 Iron deficiency anemia, unspecified: Secondary | ICD-10-CM

## 2018-10-03 DIAGNOSIS — M069 Rheumatoid arthritis, unspecified: Secondary | ICD-10-CM | POA: Diagnosis not present

## 2018-10-03 DIAGNOSIS — E785 Hyperlipidemia, unspecified: Secondary | ICD-10-CM

## 2018-10-03 NOTE — Assessment & Plan Note (Signed)
Follow-up mammogram was BI-RADS 2 benign.  One-year screening mammogram was recommended.

## 2018-10-03 NOTE — Assessment & Plan Note (Signed)
Well-controlled.  Continue current regimen.  We will check a BMP with her lab work that will be obtained later this month.

## 2018-10-03 NOTE — Assessment & Plan Note (Signed)
This previously resolved.  We will recheck with lab work later this month.

## 2018-10-03 NOTE — Assessment & Plan Note (Signed)
Stable.  Continue current tramadol regimen.  She will continue to see rheumatology.

## 2018-10-03 NOTE — Assessment & Plan Note (Signed)
Continue pravastatin.  We will plan to recheck her labs later this month as scheduled.

## 2018-10-03 NOTE — Progress Notes (Signed)
Virtual Visit via video Note  This visit type was conducted due to national recommendations for restrictions regarding the COVID-19 pandemic (e.g. social distancing).  This format is felt to be most appropriate for this patient at this time.  All issues noted in this document were discussed and addressed.  No physical exam was performed (except for noted visual exam findings with Video Visits).   I connected with Audrey Hamilton today at  2:15 PM EDT by a video enabled telemedicine application and verified that I am speaking with the correct person using two identifiers. Location patient: home Location provider: work Persons participating in the virtual visit: patient, provider  I discussed the limitations, risks, security and privacy concerns of performing an evaluation and management service by telephone and the availability of in person appointments. I also discussed with the patient that there may be a patient responsible charge related to this service. The patient expressed understanding and agreed to proceed.  Reason for visit: follow-up  HPI: Hypertension: Typically less than 140/90.  Taking amlodipine and losartan.  No chest pain or shortness of breath.  No edema.  Hyperlipidemia: Taking pravastatin.  No right upper quadrant pain or myalgias.  Rheumatoid arthritis: She continues on Arava through rheumatology.  She notes this has worked better than anything else she has taken.  She continues on tramadol twice daily to help with joint pain.  This does not make her drowsy.  No alcohol intake.  Iron deficiency anemia: She has not had any significant anemia.  She recently underwent a colonoscopy.  She had several polyps.  It was recommended that she have a 3-year follow-up on colonoscopy.  She has had no bleeding or melena.   ROS: See pertinent positives and negatives per HPI.  Past Medical History:  Diagnosis Date  . Allergy   . Anxiety    controlled  . Arthritis   . Chicken  pox   . Colon polyps   . Depression   . Fibromyalgia   . History of hypothyroidism   . Hyperlipidemia   . Hypokalemia   . Multiple gastric ulcers   . Rheumatoid arthritis ([place]) Since 1995   currently in a flare up    Past Surgical History:  Procedure Laterality Date  . ABDOMINAL HYSTERECTOMY  1974  . AUGMENTATION MAMMAPLASTY Bilateral 9326'[id number]   silicone  . COLONOSCOPY WITH PROPOFOL N/A 07/04/2018   Procedure: COLONOSCOPY WITH PROPOFOL;  Surgeon: Manya Silvas, MD;  Location: [facility];  Service: Endoscopy;  Laterality: N/A;  . EYE SURGERY    . FOOT SURGERY Left 11/22/10  . MASTECTOMY Bilateral 1970's   SUBCUTANEOUS MASTECTOMY - NO CANCER  . SMALL INTESTINE SURGERY  12/2001  . STOMACH SURGERY  12/2001  . TONSILLECTOMY AND ADENOIDECTOMY  1966  . UPPER GASTROINTESTINAL ENDOSCOPY      Family History  Problem Relation Age of Onset  . Arthritis Mother   . Sudden death Mother        after birth of pt who was 63 days old.  . Depression Mother   . Arthritis Maternal Grandmother   . Cancer Maternal Grandmother   . Hyperlipidemia Maternal Grandfather   . Diabetes Mellitus II Father   . Liver disease Father   . Breast cancer Neg Hx     SOCIAL HX: Former smoker.   Current Outpatient Medications:  .  amLODipine (NORVASC) 10 MG tablet, TAKE 1 TABLET BY MOUTH EVERY DAY, Disp: 90 tablet, Rfl: 1 .  Ferrous Fum-Iron Polysacch (TANDEM PO),  Take 106 mg by mouth daily., Disp: , Rfl:  .  fluticasone (FLONASE) 50 MCG/ACT nasal spray, Place into the nose at bedtime. Reported on 10/01/2015, Disp: , Rfl:  .  leflunomide (ARAVA) 10 MG tablet, 1 tab daily x 90 days, Disp: , Rfl:  .  LORazepam (ATIVAN) 1 MG tablet, TAKE 1 TABLET (1 MG TOTAL) BY MOUTH AT BEDTIME., Disp: 30 tablet, Rfl: 1 .  losartan (COZAAR) 100 MG tablet, Take 0.5 tablets (50 mg total) by mouth daily., Disp: 45 tablet, Rfl: 1 .  montelukast (SINGULAIR) 10 MG tablet, TAKE 1 TABLET BY MOUTH EVERYDAY AT BEDTIME, Disp: 90  tablet, Rfl: 0 .  Omega-3 Fatty Acids (OMEGA-3 FISH OIL) 300 MG CAPS, Take by mouth., Disp: , Rfl:  .  omeprazole (PRILOSEC) 20 MG capsule, Take by mouth., Disp: , Rfl:  .  pravastatin (PRAVACHOL) 20 MG tablet, Take 1 tablet (20 mg total) by mouth daily., Disp: 90 tablet, Rfl: 3 .  traMADol (ULTRAM) 50 MG tablet, Take 1 tablet (50 mg total) by mouth every 8 (eight) hours as needed., Disp: 90 tablet, Rfl: 0 .  Levocetirizine Dihydrochloride (XYZAL ALLERGY 24HR PO), Take 1 tablet by mouth daily., Disp: , Rfl:  .  potassium chloride SA (K-DUR,KLOR-CON) 20 MEQ tablet, Take 1 tablet (20 mEq total) by mouth daily. (Patient not taking: Reported on 10/03/2018), Disp: 6 tablet, Rfl: 0  EXAM:  VITALS per patient if applicable: None.  GENERAL: alert, oriented, appears well and in no acute distress  HEENT: atraumatic, conjunttiva clear, no obvious abnormalities on inspection of external nose and ears  NECK: normal movements of the head and neck  LUNGS: on inspection no signs of respiratory distress, breathing rate appears normal, no obvious gross SOB, gasping or wheezing  CV: no obvious cyanosis  MS: moves all visible extremities without noticeable abnormality  PSYCH/NEURO: pleasant and cooperative, no obvious depression or anxiety, speech and thought processing grossly intact  ASSESSMENT AND PLAN:  Discussed the following assessment and plan:  Essential hypertension Well-controlled.  Continue current regimen.  We will check a BMP with her lab work that will be obtained later this month.  Rheumatoid arthritis ([place]) Stable.  Continue current tramadol regimen.  She will continue to see rheumatology.  Abnormal mammogram Follow-up mammogram was BI-RADS 2 benign.  One-year screening mammogram was recommended.  HLD (hyperlipidemia) Continue pravastatin.  We will plan to recheck her labs later this month as scheduled.  Iron deficiency anemia This previously resolved.  We will recheck with lab  work later this month.  [facility] office staff will contact the patient and get her scheduled for follow-up in 6 months.  Social distancing precautions and sick precautions given regarding COVID-19.   I discussed the assessment and treatment plan with the patient. The patient was provided an opportunity to ask questions and all were answered. The patient agreed with the plan and demonstrated an understanding of the instructions.   The patient was advised to call back or seek an in-person evaluation if the symptoms worsen or if the condition fails to improve as anticipated.   Tommi Rumps, MD

## 2018-10-10 DIAGNOSIS — M0609 Rheumatoid arthritis without rheumatoid factor, multiple sites: Secondary | ICD-10-CM | POA: Diagnosis not present

## 2018-10-10 DIAGNOSIS — Z79899 Other long term (current) drug therapy: Secondary | ICD-10-CM | POA: Diagnosis not present

## 2018-10-13 ENCOUNTER — Other Ambulatory Visit: Payer: Self-pay

## 2018-10-13 ENCOUNTER — Other Ambulatory Visit (INDEPENDENT_AMBULATORY_CARE_PROVIDER_SITE_OTHER): Payer: Medicare Other

## 2018-10-13 DIAGNOSIS — E785 Hyperlipidemia, unspecified: Secondary | ICD-10-CM

## 2018-10-13 DIAGNOSIS — I10 Essential (primary) hypertension: Secondary | ICD-10-CM | POA: Diagnosis not present

## 2018-10-13 DIAGNOSIS — D509 Iron deficiency anemia, unspecified: Secondary | ICD-10-CM

## 2018-10-13 LAB — HEPATIC FUNCTION PANEL
ALT: 14 U/L (ref 0–35)
AST: 15 U/L (ref 0–37)
Albumin: 3.8 g/dL (ref 3.5–5.2)
Alkaline Phosphatase: 70 U/L (ref 39–117)
Bilirubin, Direct: 0 mg/dL (ref 0.0–0.3)
Total Bilirubin: 0.2 mg/dL (ref 0.2–1.2)
Total Protein: 6.4 g/dL (ref 6.0–8.3)

## 2018-10-13 LAB — CBC
HCT: 36.1 % (ref 36.0–46.0)
Hemoglobin: 11.6 g/dL — ABNORMAL LOW (ref 12.0–15.0)
MCHC: 32.1 g/dL (ref 30.0–36.0)
MCV: 78.8 fl (ref 78.0–100.0)
Platelets: 310 10*3/uL (ref 150.0–400.0)
RBC: 4.58 Mil/uL (ref 3.87–5.11)
RDW: 18.6 % — ABNORMAL HIGH (ref 11.5–15.5)
WBC: 5.3 10*3/uL (ref 4.0–10.5)

## 2018-10-13 LAB — BASIC METABOLIC PANEL
BUN: 9 mg/dL (ref 6–23)
CO2: 23 mEq/L (ref 19–32)
Calcium: 8.9 mg/dL (ref 8.4–10.5)
Chloride: 105 mEq/L (ref 96–112)
Creatinine, Ser: 0.44 mg/dL (ref 0.40–1.20)
GFR: 140.41 mL/min (ref >60.00)
Glucose, Bld: 100 mg/dL — ABNORMAL HIGH (ref 70–99)
Potassium: 4 mEq/L (ref 3.5–5.1)
Sodium: 140 mEq/L (ref 135–145)

## 2018-10-13 LAB — IBC + FERRITIN
Ferritin: 15.5 ng/mL (ref 10.0–291.0)
Iron: 57 ug/dL (ref 42–145)
Saturation Ratios: 14.2 % — ABNORMAL LOW (ref 20.0–50.0)
Transferrin: 287 mg/dL (ref 212.0–360.0)

## 2018-10-13 LAB — LDL CHOLESTEROL, DIRECT: Direct LDL: 188 mg/dL

## 2018-10-17 ENCOUNTER — Other Ambulatory Visit: Payer: Self-pay | Admitting: Family Medicine

## 2018-10-24 ENCOUNTER — Other Ambulatory Visit: Payer: Self-pay | Admitting: Family Medicine

## 2018-10-25 ENCOUNTER — Other Ambulatory Visit: Payer: Self-pay | Admitting: Family Medicine

## 2018-10-25 DIAGNOSIS — D509 Iron deficiency anemia, unspecified: Secondary | ICD-10-CM

## 2018-10-25 DIAGNOSIS — E785 Hyperlipidemia, unspecified: Secondary | ICD-10-CM

## 2018-10-25 MED ORDER — PRAVASTATIN SODIUM 40 MG PO TABS: 40.0000 mg | ORAL_TABLET | Freq: Every day | ORAL | 1 refills | Status: DC

## 2018-10-25 NOTE — Telephone Encounter (Signed)
Refilled: 08/22/2018 Last OV: 10/03/2018 Next OV: 10/31/2018

## 2018-10-26 DIAGNOSIS — M5033 Other cervical disc degeneration, cervicothoracic region: Secondary | ICD-10-CM | POA: Diagnosis not present

## 2018-10-26 DIAGNOSIS — M9902 Segmental and somatic dysfunction of thoracic region: Secondary | ICD-10-CM | POA: Diagnosis not present

## 2018-10-26 DIAGNOSIS — M9901 Segmental and somatic dysfunction of cervical region: Secondary | ICD-10-CM | POA: Diagnosis not present

## 2018-10-26 DIAGNOSIS — M62830 Muscle spasm of back: Secondary | ICD-10-CM | POA: Diagnosis not present

## 2018-10-27 ENCOUNTER — Telehealth: Payer: Self-pay

## 2018-10-27 NOTE — Telephone Encounter (Signed)
Copied from [place] [id number]. Topic: Appointment Scheduling - Scheduling Inquiry for Clinic >> Oct 26, 2018  5:11 PM Rutherford Nail, [place] wrote: Reason for CRM: Patient calling to schedule 6-8 weeks lab appointment. Please advise.

## 2018-10-31 ENCOUNTER — Encounter: Payer: Self-pay | Admitting: Family Medicine

## 2018-10-31 ENCOUNTER — Ambulatory Visit (INDEPENDENT_AMBULATORY_CARE_PROVIDER_SITE_OTHER): Payer: Medicare Other

## 2018-10-31 ENCOUNTER — Other Ambulatory Visit: Payer: Self-pay

## 2018-10-31 ENCOUNTER — Telehealth: Payer: Self-pay | Admitting: Family Medicine

## 2018-10-31 ENCOUNTER — Ambulatory Visit (INDEPENDENT_AMBULATORY_CARE_PROVIDER_SITE_OTHER): Payer: Medicare Other | Admitting: Family Medicine

## 2018-10-31 DIAGNOSIS — I10 Essential (primary) hypertension: Secondary | ICD-10-CM | POA: Diagnosis not present

## 2018-10-31 DIAGNOSIS — E785 Hyperlipidemia, unspecified: Secondary | ICD-10-CM | POA: Diagnosis not present

## 2018-10-31 DIAGNOSIS — T8543XD Leakage of breast prosthesis and implant, subsequent encounter: Secondary | ICD-10-CM

## 2018-10-31 DIAGNOSIS — Z0000 Encounter for general adult medical examination without abnormal findings: Secondary | ICD-10-CM | POA: Diagnosis not present

## 2018-10-31 DIAGNOSIS — D509 Iron deficiency anemia, unspecified: Secondary | ICD-10-CM | POA: Diagnosis not present

## 2018-10-31 DIAGNOSIS — R7303 Prediabetes: Secondary | ICD-10-CM

## 2018-10-31 DIAGNOSIS — T8543XA Leakage of breast prosthesis and implant, initial encounter: Secondary | ICD-10-CM | POA: Insufficient documentation

## 2018-10-31 MED ORDER — TETANUS-DIPHTH-ACELL PERTUSSIS 5-2.5-18.5 LF-MCG/0.5 IM SUSP: 0.5000 mL | Freq: Once | INTRAMUSCULAR | 0 refills | Status: AC

## 2018-10-31 NOTE — Patient Instructions (Addendum)
  Ms. Reinhold , Thank you for taking time to come for your Medicare Wellness Visit. I appreciate your ongoing commitment to your health goals. Please review the following plan we discussed and let me know if I can assist you in the future.   These are the goals we discussed: Goals      Patient Stated   . Increase physical activity (pt-stated)     Walk more for exercise       This is a list of the screening recommended for you and due dates:  Health Maintenance  Topic Date Due  . Tetanus Vaccine  05/30/1964  . Flu Shot  11/19/2018  . Mammogram  04/01/2020  . Colon Cancer Screening  07/03/2021  . DEXA scan (bone density measurement)  Completed  .  Hepatitis C: One time screening is recommended by Center for Disease Control  (CDC) for  adults born from [birth year] through 1964.   Completed  . Pneumonia vaccines  Addressed

## 2018-10-31 NOTE — Assessment & Plan Note (Signed)
Adequately controlled.  Continue current regimen. 

## 2018-10-31 NOTE — Telephone Encounter (Signed)
Called and spoke with patient and she informed me she is taking the atorvastatin and that she is not taking pravastatin.  Nina,cma

## 2018-10-31 NOTE — Assessment & Plan Note (Signed)
Check A1c. 

## 2018-10-31 NOTE — Progress Notes (Addendum)
Virtual Visit via video Note  This visit type was conducted due to national recommendations for restrictions regarding the COVID-19 pandemic (e.g. social distancing).  This format is felt to be most appropriate for this patient at this time.  All issues noted in this document were discussed and addressed.  No physical exam was performed (except for noted visual exam findings with Video Visits).   I connected with Audrey Hamilton today at 10:30 AM EDT by a video enabled telemedicine application and verified that I am speaking with the correct person using two identifiers. Location patient: home Location provider: work Persons participating in the virtual visit: patient, provider  I discussed the limitations, risks, security and privacy concerns of performing an evaluation and management service by telephone and the availability of in person appointments. I also discussed with the patient that there may be a patient responsible charge related to this service. The patient expressed understanding and agreed to proceed.   Reason for visit: Follow-up.  HPI: Hypertension: Typically [phone] systolically.  Taking amlodipine and losartan.  No chest pain or shortness of breath.  She notes mild ankle edema that occurs just at night over the last 2 weeks.  It is not every night.  It goes down by the morning.  No orthopnea or PND.  Recent lab work reassuring.  Hyperlipidemia: She is tolerating atorvastatin.  No right upper quadrant pain or myalgias.  Prediabetes: No polyuria or polydipsia.  She does not check CBGs.  Iron deficiency: She had slacked off on taking her iron to 3 days a week.  She is back to taking it daily.  She notes no bleeding.  She has not received a call from GI yet.   ROS: See pertinent positives and negatives per HPI.  Past Medical History:  Diagnosis Date  . Allergy   . Anxiety    controlled  . Arthritis   . Chicken pox   . Colon polyps   . Depression   . Fibromyalgia    . History of hypothyroidism   . Hyperlipidemia   . Hypokalemia   . Multiple gastric ulcers   . Rheumatoid arthritis ([place]) Since 1995   currently in a flare up    Past Surgical History:  Procedure Laterality Date  . ABDOMINAL HYSTERECTOMY  1974  . AUGMENTATION MAMMAPLASTY Bilateral 9767'H   silicone  . COLONOSCOPY WITH PROPOFOL N/A 07/04/2018   Procedure: COLONOSCOPY WITH PROPOFOL;  Surgeon: Manya Silvas, MD;  Location: [facility] ENDOSCOPY;  Service: Endoscopy;  Laterality: N/A;  . EYE SURGERY    . FOOT SURGERY Left 11/22/10  . MASTECTOMY Bilateral 1970's   SUBCUTANEOUS MASTECTOMY - NO CANCER  . SMALL INTESTINE SURGERY  12/2001  . STOMACH SURGERY  12/2001  . TONSILLECTOMY AND ADENOIDECTOMY  1966  . UPPER GASTROINTESTINAL ENDOSCOPY      Family History  Problem Relation Age of Onset  . Arthritis Mother   . Sudden death Mother        after birth of pt who was 18 days old.  . Depression Mother   . Arthritis Maternal Grandmother   . Cancer Maternal Grandmother   . Hyperlipidemia Maternal Grandfather   . Diabetes Mellitus II Father   . Liver disease Father   . Breast cancer Neg Hx     SOCIAL HX: Former smoker   Current Outpatient Medications:  .  amLODipine (NORVASC) 10 MG tablet, TAKE 1 TABLET BY MOUTH EVERY DAY, Disp: 90 tablet, Rfl: 1 .  atorvastatin (LIPITOR) 20 MG  tablet, Take 20 mg by mouth daily., Disp: , Rfl:  .  Ferrous Fum-Iron Polysacch (TANDEM PO), Take 106 mg by mouth daily., Disp: , Rfl:  .  fluticasone (FLONASE) 50 MCG/ACT nasal spray, Place into the nose at bedtime. Reported on 10/01/2015, Disp: , Rfl:  .  leflunomide (ARAVA) 10 MG tablet, 1 tab daily x 90 days, Disp: , Rfl:  .  LORazepam (ATIVAN) 1 MG tablet, TAKE 1 TABLET (1 MG TOTAL) BY MOUTH AT BEDTIME., Disp: 30 tablet, Rfl: 1 .  losartan (COZAAR) 100 MG tablet, Take 0.5 tablets (50 mg total) by mouth daily., Disp: 45 tablet, Rfl: 1 .  montelukast (SINGULAIR) 10 MG tablet, TAKE 1 TABLET BY MOUTH EVERYDAY  AT BEDTIME, Disp: 90 tablet, Rfl: 0 .  omeprazole (PRILOSEC) 20 MG capsule, Take by mouth., Disp: , Rfl:  .  potassium chloride SA (K-DUR,KLOR-CON) 20 MEQ tablet, Take 1 tablet (20 mEq total) by mouth daily., Disp: 6 tablet, Rfl: 0 .  traMADol (ULTRAM) 50 MG tablet, TAKE 1 TABLET (50 MG TOTAL) BY MOUTH EVERY 8 (EIGHT) HOURS AS NEEDED., Disp: 90 tablet, Rfl: 0 .  Levocetirizine Dihydrochloride (XYZAL ALLERGY 24HR PO), Take 1 tablet by mouth daily., Disp: , Rfl:  .  Omega-3 Fatty Acids (OMEGA-3 FISH OIL) 300 MG CAPS, Take by mouth., Disp: , Rfl:  .  Tdap (BOOSTRIX) 5-2.5-18.5 LF-MCG/0.5 injection, Inject 0.5 mLs into the muscle once for 1 dose., Disp: 0.5 mL, Rfl: 0  EXAM:  VITALS per patient if applicable: None.  GENERAL: alert, oriented, appears well and in no acute distress  HEENT: atraumatic, conjunttiva clear, no obvious abnormalities on inspection of external nose and ears  NECK: normal movements of the head and neck  LUNGS: on inspection no signs of respiratory distress, breathing rate appears normal, no obvious gross SOB, gasping or wheezing  CV: no obvious cyanosis  MS: moves all visible extremities without noticeable abnormality  PSYCH/NEURO: pleasant and cooperative, no obvious depression or anxiety, speech and thought processing grossly intact  ASSESSMENT AND PLAN:  Discussed the following assessment and plan:  Essential hypertension Adequately controlled.  Continue current regimen.  Breast implant rupture She reports that they wanted to take these out though her insurance would not pay for this.  She will monitor.  HLD (hyperlipidemia) Continue atorvastatin.  Check lipids next month as planned.  Iron deficiency anemia She will continue on her iron.  I advised that she does not hear from GI in the next 2 weeks she should contact us.  Prediabetes Check A1c.   Patient will go to the pharmacy to have her tetanus vaccine.  Prescription sent.   I discussed the  assessment and treatment plan with the patient. The patient was provided an opportunity to ask questions and all were answered. The patient agreed with the plan and demonstrated an understanding of the instructions.   The patient was advised to call back or seek an in-person evaluation if the symptoms worsen or if the condition fails to improve as anticipated.   Tommi Rumps, MD

## 2018-10-31 NOTE — Assessment & Plan Note (Addendum)
Continue atorvastatin.  Check lipids next month as planned.

## 2018-10-31 NOTE — Addendum Note (Signed)
Addended by: Leone Haven on: 10/31/2018 12:35 PM   Modules accepted: Orders

## 2018-10-31 NOTE — Progress Notes (Signed)
Subjective:   Audrey Hamilton is a 73 y.o. female who presents for Medicare Annual (Subsequent) preventive examination.  Review of Systems:  No ROS.  Medicare Wellness Virtual Visit.  Visual/audio telehealth visit, UTA vital signs.   See social history for additional risk factors.   Cardiac Risk Factors include: advanced age (>42men, >69 women);hypertension     Objective:     Vitals: There were no vitals taken for this visit.  There is no height or weight on file to calculate BMI.  Advanced Directives 10/31/2018 07/04/2018 10/27/2017 07/22/2015  Does Patient Have a Medical Advance Directive? Yes Yes Yes Yes  Type of Paramedic of [place];Living will [place];Living will [place];Living will  Does patient want to make changes to medical advance directive? No - Patient declined - No - Patient declined No - Patient declined  Copy of [place] in Chart? No - copy requested Yes - validated most recent copy scanned in chart (See row information) No - copy requested No - copy requested    Tobacco Social History   Tobacco Use  Smoking Status Former Smoker  . Quit date: 06/19/1995  . Years since quitting: 23.3  Smokeless Tobacco Never Used     Counseling given: Not Answered   Clinical Intake:  Pre-visit preparation completed: Yes        Diabetes: No  How often do you need to have someone help you when you read instructions, pamphlets, or other written materials from your doctor or pharmacy?: 1 - Never  Interpreter Needed?: No     Past Medical History:  Diagnosis Date  . Allergy   . Anxiety    controlled  . Arthritis   . Chicken pox   . Colon polyps   . Depression   . Fibromyalgia   . History of hypothyroidism   . Hyperlipidemia   . Hypokalemia   . Multiple gastric ulcers   . Rheumatoid arthritis ([place]) Since 1995   currently in a flare up   Past  Surgical History:  Procedure Laterality Date  . ABDOMINAL HYSTERECTOMY  1974  . AUGMENTATION MAMMAPLASTY Bilateral 2595'G   silicone  . COLONOSCOPY WITH PROPOFOL N/A 07/04/2018   Procedure: COLONOSCOPY WITH PROPOFOL;  Surgeon: Manya Silvas, MD;  Location[facility] ENDOSCOPY;  Service: Endoscopy;  Laterality: N/A;  . EYE SURGERY    . FOOT SURGERY Left 11/22/10  . MASTECTOMY Bilateral 1970's   SUBCUTANEOUS MASTECTOMY - NO CANCER  . SMALL INTESTINE SURGERY  12/2001  . STOMACH SURGERY  12/2001  . TONSILLECTOMY AND ADENOIDECTOMY  1966  . UPPER GASTROINTESTINAL ENDOSCOPY     Family History  Problem Relation Age of Onset  . Arthritis Mother   . Sudden death Mother        after birth of pt who was 27 days old.  . Depression Mother   . Arthritis Maternal Grandmother   . Cancer Maternal Grandmother   . Hyperlipidemia Maternal Grandfather   . Diabetes Mellitus II Father   . Liver disease Father   . Rheum arthritis Son   . Stroke Son   . Breast cancer Neg Hx    Social History   Socioeconomic History  . Marital status: Widowed    Spouse name: Not on file  . Number of children: Not on file  . Years of education: Not on file  . Highest education level: Not on file  Occupational History  .  Not on file  Social Needs  . Financial resource strain: Not hard at all  . Food insecurity    Worry: Never true    Inability: Never true  . Transportation needs    Medical: No    Non-medical: No  Tobacco Use  . Smoking status: Former Smoker    Quit date: 06/19/1995    Years since quitting: 23.3  . Smokeless tobacco: Never Used  Substance and Sexual Activity  . Alcohol use: Yes    Alcohol/week: 0.0 - 1.0 standard drinks    Comment: occ  . Drug use: No  . Sexual activity: Not on file  Lifestyle  . Physical activity    Days per week: 3 days    Minutes per session: 10 min  . Stress: Not at all  Relationships  . Social Herbalist on phone: Not on file    Gets together: Not on file     Attends religious service: Not on file    Active member of club or organization: Not on file    Attends meetings of clubs or organizations: Not on file    Relationship status: Not on file  Other Topics Concern  . Not on file  Social History Narrative  . Not on file    Outpatient Encounter Medications as of 10/31/2018  Medication Sig  . amLODipine (NORVASC) 10 MG tablet TAKE 1 TABLET BY MOUTH EVERY DAY  . atorvastatin (LIPITOR) 20 MG tablet Take 20 mg by mouth daily.  . Ferrous Fum-Iron Polysacch (TANDEM PO) Take 106 mg by mouth daily.  . fluticasone (FLONASE) 50 MCG/ACT nasal spray Place into the nose at bedtime. Reported on 10/01/2015  . leflunomide (ARAVA) 10 MG tablet 1 tab daily x 90 days  . Levocetirizine Dihydrochloride (XYZAL ALLERGY 24HR PO) Take 1 tablet by mouth daily.  Marland Kitchen LORazepam (ATIVAN) 1 MG tablet TAKE 1 TABLET (1 MG TOTAL) BY MOUTH AT BEDTIME.  Marland Kitchen losartan (COZAAR) 100 MG tablet Take 0.5 tablets (50 mg total) by mouth daily.  . montelukast (SINGULAIR) 10 MG tablet TAKE 1 TABLET BY MOUTH EVERYDAY AT BEDTIME  . Omega-3 Fatty Acids (OMEGA-3 FISH OIL) 300 MG CAPS Take by mouth.  Marland Kitchen omeprazole (PRILOSEC) 20 MG capsule Take by mouth.  . potassium chloride SA (K-DUR,KLOR-CON) 20 MEQ tablet Take 1 tablet (20 mEq total) by mouth daily.  . Tdap (BOOSTRIX) 5-2.5-18.5 LF-MCG/0.5 injection Inject 0.5 mLs into the muscle once for 1 dose.  . traMADol (ULTRAM) 50 MG tablet TAKE 1 TABLET (50 MG TOTAL) BY MOUTH EVERY 8 (EIGHT) HOURS AS NEEDED.   No facility-administered encounter medications on file as of 10/31/2018.     Activities of Daily Living In your present state of health, do you have any difficulty performing the following activities: 10/31/2018  Hearing? N  Vision? N  Difficulty concentrating or making decisions? N  Walking or climbing stairs? Y  Comment Unsteady gait.  ambulates with cane.  Dressing or bathing? N  Doing errands, shopping? N  Preparing Food and eating ? N   Using the Toilet? N  In the past six months, have you accidently leaked urine? Y  Comment Managed with daily pad  Do you have problems with loss of bowel control? N  Managing your Medications? N  Managing your Finances? N  Housekeeping or managing your Housekeeping? Y  Comment Maid assist  Some recent data might be hidden    Patient Care Team: Leone Haven, MD as PCP - General (  Family Medicine)    Assessment:   This is a routine wellness examination for [place].  I connected with patient 10/31/18 at  3:00 PM EDT by a video/audio enabled telemedicine application and verified that I am speaking with the correct person using two identifiers. Patient stated full name and DOB. Patient gave permission to continue with virtual visit. Patient's location was at home and Nurse's location was at [place] office.   Health Screenings  Mammogram - 03/2018 Colonoscopy - 06/2018 Bone Density - 07/2015 Glaucoma -none Hearing -demonstrates normal hearing during visit. Hemoglobin A1C - 04/2018 LDL Direct Cholesterol - 06/2018 Dental- dentures Vision- visits within the last 12 months.  Social  Alcohol intake - yes        Smoking history-  former    Smokers in home? none Illicit drug use? none Exercise - walking, recumbent bike 3 times daily, 15 min Diet - regular BMI- discussed the importance of a healthy diet, water intake and the benefits of aerobic exercise.  Educational material provided.   Safety  Patient feels safe at home- yes Patient does have smoke detectors at home- yes Patient does wear sunscreen or protective clothing when in direct sunlight -yes Patient does wear seat belt when in a moving vehicle -yes Patient drives- yes  [id number] precautions and sickness symptoms discussed.   Activities of Daily Living Patient denies needing assistance with: driving, feeding themselves, getting from bed to chair, getting to the toilet, bathing/showering, dressing, managing money, or  preparing meals.  No new identified risk were noted.    Depression Screen Patient denies losing interest in daily life, feeling hopeless, or crying easily over simple problems.   Medication-taking as directed and without issues.   Fall Screen Patient denies being afraid of falling or falling in the last year.   Memory Screen Patient is alert.  Patient denies difficulty focusing, concentrating or misplacing items. Correctly identified the president of the [place], season and recall. Patient likes to read, use ipad, and complete crossword/hunt/jigsaw  puzzles for brain stimulation.  Immunizations The following Immunizations were discussed: Influenza, shingles, pneumonia, and tetanus. She plans to have her Tdap given at pharmacy and notify office when complete.   Other Providers Patient Care Team: Leone Haven, MD as PCP - General (Family Medicine)  Exercise Activities and Dietary recommendations Current Exercise Habits: Home exercise routine, Type of exercise: walking;calisthenics, Time (Minutes): 15, Frequency (Times/Week): 3, Weekly Exercise (Minutes/Week): 45, Intensity: Mild  Goals      Patient Stated   . Increase physical activity (pt-stated)     Walk more for exercise       Fall Risk Fall Risk  10/31/2018 02/21/2018 10/27/2017 07/05/2017 04/05/2017  Falls in the past year? 0 0 No No No  Number falls in past yr: - 0 - - -  Injury with Fall? - 0 - - -   Depression Screen PHQ 2/9 Scores 10/31/2018 02/21/2018 10/27/2017 07/05/2017  PHQ - 2 Score 0 0 0 0     Cognitive Function MMSE - Mini Mental State Exam 10/27/2017  Orientation to time 5  Orientation to Place 5  Registration 3  Attention/ Calculation 5  Recall 3  Language- name 2 objects 2  Language- repeat 1  Language- follow 3 step command 3  Language- read & follow direction 1  Write a sentence 1  Copy design 1  Total score 30     6CIT Screen 10/31/2018  What Year? 0 points  What month? 0 points  What  time? 0 points  Count back from 20 0 points  Months in reverse 0 points  Repeat phrase 0 points  Total Score 0    Immunization History  Administered Date(s) Administered  . Influenza, High Dose Seasonal PF 01/17/2018  . Influenza-Unspecified 12/20/2014, 01/02/2016   Screening Tests Health Maintenance  Topic Date Due  . TETANUS/TDAP  05/30/1965  . INFLUENZA VACCINE  11/19/2018  . MAMMOGRAM  04/01/2020  . COLONOSCOPY  07/03/2021  . DEXA SCAN  Completed  . Hepatitis C Screening  Completed  . PNA vac Low Risk Adult  Addressed      Plan:    End of life planning; Advance aging; Advanced directives discussed.  Copy of current HCPOA/Living Will requested.    I have personally reviewed and noted the following in the patient's chart:   . Medical and social history . Use of alcohol, tobacco or illicit drugs  . Current medications and supplements . Functional ability and status . Nutritional status . Physical activity . Advanced directives . List of other physicians . Hospitalizations, surgeries, and ER visits in previous 12 months . Vitals . Screenings to include cognitive, depression, and falls . Referrals and appointments  In addition, I have reviewed and discussed with patient certain preventive protocols, quality metrics, and best practice recommendations. A written personalized care plan for preventive services as well as general preventive health recommendations were provided to patient.     Varney Biles, LPN  [id number]/88/[id number]

## 2018-10-31 NOTE — Assessment & Plan Note (Signed)
She reports that they wanted to take these out though her insurance would not pay for this.  She will monitor.

## 2018-10-31 NOTE — Telephone Encounter (Signed)
The patient had a visit today.  Please contact her and confirm whether or not she is taking atorvastatin.  It appears atorvastatin was added to her medication list today.  Please also confirm that she is taking pravastatin.  Thanks.

## 2018-10-31 NOTE — Telephone Encounter (Signed)
Noted.  I will remove pravastatin from her medication list.

## 2018-10-31 NOTE — Assessment & Plan Note (Signed)
She will continue on her iron.  I advised that she does not hear from GI in the next 2 weeks she should contact us.

## 2018-11-03 DIAGNOSIS — Z86010 Personal history of colonic polyps: Secondary | ICD-10-CM | POA: Diagnosis not present

## 2018-11-03 DIAGNOSIS — D509 Iron deficiency anemia, unspecified: Secondary | ICD-10-CM | POA: Diagnosis not present

## 2018-11-03 DIAGNOSIS — Z8719 Personal history of other diseases of the digestive system: Secondary | ICD-10-CM | POA: Diagnosis not present

## 2018-11-06 NOTE — Progress Notes (Signed)
I have reviewed the above note and agree.  Gaia Gullikson, M.D.  

## 2018-11-12 ENCOUNTER — Other Ambulatory Visit: Payer: Self-pay | Admitting: Family Medicine

## 2018-11-30 DIAGNOSIS — M9901 Segmental and somatic dysfunction of cervical region: Secondary | ICD-10-CM | POA: Diagnosis not present

## 2018-11-30 DIAGNOSIS — M5033 Other cervical disc degeneration, cervicothoracic region: Secondary | ICD-10-CM | POA: Diagnosis not present

## 2018-11-30 DIAGNOSIS — M9902 Segmental and somatic dysfunction of thoracic region: Secondary | ICD-10-CM | POA: Diagnosis not present

## 2018-11-30 DIAGNOSIS — M62830 Muscle spasm of back: Secondary | ICD-10-CM | POA: Diagnosis not present

## 2018-12-01 ENCOUNTER — Other Ambulatory Visit (INDEPENDENT_AMBULATORY_CARE_PROVIDER_SITE_OTHER): Payer: Medicare Other

## 2018-12-01 ENCOUNTER — Other Ambulatory Visit: Payer: Self-pay | Admitting: Family Medicine

## 2018-12-01 ENCOUNTER — Other Ambulatory Visit: Payer: Self-pay

## 2018-12-01 DIAGNOSIS — E785 Hyperlipidemia, unspecified: Secondary | ICD-10-CM | POA: Diagnosis not present

## 2018-12-01 DIAGNOSIS — R7303 Prediabetes: Secondary | ICD-10-CM

## 2018-12-01 LAB — HEPATIC FUNCTION PANEL
ALT: 15 U/L (ref 0–35)
AST: 15 U/L (ref 0–37)
Albumin: 4 g/dL (ref 3.5–5.2)
Alkaline Phosphatase: 65 U/L (ref 39–117)
Bilirubin, Direct: 0 mg/dL (ref 0.0–0.3)
Total Bilirubin: 0.2 mg/dL (ref 0.2–1.2)
Total Protein: 6.4 g/dL (ref 6.0–8.3)

## 2018-12-01 LAB — LIPID PANEL
Cholesterol: 238 mg/dL — ABNORMAL HIGH (ref 0–200)
HDL: 44 mg/dL (ref >39.00)
Total CHOL/HDL Ratio: 5
Triglycerides: 425 mg/dL — ABNORMAL HIGH (ref 0.0–149.0)

## 2018-12-01 LAB — LDL CHOLESTEROL, DIRECT: Direct LDL: 132 mg/dL

## 2018-12-01 LAB — HEMOGLOBIN A1C: Hgb A1c MFr Bld: 6 % (ref 4.6–6.5)

## 2018-12-02 NOTE — Telephone Encounter (Signed)
Last ov 10/31/18 last fill 10/18/18 ?

## 2018-12-03 DIAGNOSIS — Z23 Encounter for immunization: Secondary | ICD-10-CM | POA: Diagnosis not present

## 2018-12-14 ENCOUNTER — Other Ambulatory Visit: Payer: Self-pay | Admitting: Family Medicine

## 2018-12-14 DIAGNOSIS — E782 Mixed hyperlipidemia: Secondary | ICD-10-CM

## 2018-12-14 MED ORDER — ATORVASTATIN CALCIUM 40 MG PO TABS: 40.0000 mg | ORAL_TABLET | Freq: Every day | ORAL | 1 refills | Status: DC

## 2018-12-22 ENCOUNTER — Other Ambulatory Visit: Payer: Self-pay | Admitting: Family Medicine

## 2018-12-23 NOTE — Telephone Encounter (Signed)
Last OV 10/31/18 last fill 11/25/18

## 2018-12-28 DIAGNOSIS — M62830 Muscle spasm of back: Secondary | ICD-10-CM | POA: Diagnosis not present

## 2018-12-28 DIAGNOSIS — M9902 Segmental and somatic dysfunction of thoracic region: Secondary | ICD-10-CM | POA: Diagnosis not present

## 2018-12-28 DIAGNOSIS — M791 Myalgia, unspecified site: Secondary | ICD-10-CM | POA: Diagnosis not present

## 2018-12-28 DIAGNOSIS — M5033 Other cervical disc degeneration, cervicothoracic region: Secondary | ICD-10-CM | POA: Diagnosis not present

## 2018-12-28 DIAGNOSIS — M170 Bilateral primary osteoarthritis of knee: Secondary | ICD-10-CM | POA: Diagnosis not present

## 2018-12-28 DIAGNOSIS — M0609 Rheumatoid arthritis without rheumatoid factor, multiple sites: Secondary | ICD-10-CM | POA: Diagnosis not present

## 2018-12-28 DIAGNOSIS — M9901 Segmental and somatic dysfunction of cervical region: Secondary | ICD-10-CM | POA: Diagnosis not present

## 2018-12-28 DIAGNOSIS — Z79899 Other long term (current) drug therapy: Secondary | ICD-10-CM | POA: Diagnosis not present

## 2019-01-12 ENCOUNTER — Other Ambulatory Visit: Payer: Self-pay | Admitting: Family Medicine

## 2019-01-20 ENCOUNTER — Other Ambulatory Visit (INDEPENDENT_AMBULATORY_CARE_PROVIDER_SITE_OTHER): Payer: Medicare Other

## 2019-01-20 ENCOUNTER — Other Ambulatory Visit: Payer: Self-pay

## 2019-01-20 DIAGNOSIS — E782 Mixed hyperlipidemia: Secondary | ICD-10-CM

## 2019-01-20 LAB — LIPID PANEL
Cholesterol: 223 mg/dL — ABNORMAL HIGH (ref 0–200)
HDL: 39.6 mg/dL (ref >39.00)
NonHDL: 183.14
Total CHOL/HDL Ratio: 6
Triglycerides: 367 mg/dL — ABNORMAL HIGH (ref 0.0–149.0)
VLDL: 73.4 mg/dL — ABNORMAL HIGH (ref 0.0–40.0)

## 2019-01-20 LAB — HEPATIC FUNCTION PANEL
ALT: 13 U/L (ref 0–35)
AST: 16 U/L (ref 0–37)
Albumin: 4 g/dL (ref 3.5–5.2)
Alkaline Phosphatase: 72 U/L (ref 39–117)
Bilirubin, Direct: 0 mg/dL (ref 0.0–0.3)
Total Bilirubin: 0.2 mg/dL (ref 0.2–1.2)
Total Protein: 7 g/dL (ref 6.0–8.3)

## 2019-01-20 LAB — LDL CHOLESTEROL, DIRECT: Direct LDL: 137 mg/dL

## 2019-01-25 DIAGNOSIS — M9901 Segmental and somatic dysfunction of cervical region: Secondary | ICD-10-CM | POA: Diagnosis not present

## 2019-01-25 DIAGNOSIS — M5033 Other cervical disc degeneration, cervicothoracic region: Secondary | ICD-10-CM | POA: Diagnosis not present

## 2019-01-25 DIAGNOSIS — M9902 Segmental and somatic dysfunction of thoracic region: Secondary | ICD-10-CM | POA: Diagnosis not present

## 2019-01-25 DIAGNOSIS — M62830 Muscle spasm of back: Secondary | ICD-10-CM | POA: Diagnosis not present

## 2019-01-26 ENCOUNTER — Other Ambulatory Visit: Payer: Self-pay | Admitting: Family Medicine

## 2019-01-26 DIAGNOSIS — E782 Mixed hyperlipidemia: Secondary | ICD-10-CM

## 2019-01-26 MED ORDER — ATORVASTATIN CALCIUM 80 MG PO TABS: 80.0000 mg | ORAL_TABLET | Freq: Every day | ORAL | 1 refills | Status: DC

## 2019-02-09 ENCOUNTER — Other Ambulatory Visit: Payer: Self-pay | Admitting: Family Medicine

## 2019-02-16 ENCOUNTER — Other Ambulatory Visit: Payer: Self-pay | Admitting: Family Medicine

## 2019-02-22 ENCOUNTER — Other Ambulatory Visit: Payer: Self-pay | Admitting: Family Medicine

## 2019-03-09 ENCOUNTER — Other Ambulatory Visit: Payer: Self-pay

## 2019-03-10 ENCOUNTER — Other Ambulatory Visit: Payer: Self-pay

## 2019-03-10 ENCOUNTER — Other Ambulatory Visit (INDEPENDENT_AMBULATORY_CARE_PROVIDER_SITE_OTHER): Payer: Medicare Other

## 2019-03-10 DIAGNOSIS — E782 Mixed hyperlipidemia: Secondary | ICD-10-CM | POA: Diagnosis not present

## 2019-03-10 LAB — HEPATIC FUNCTION PANEL
ALT: 14 U/L (ref 0–35)
AST: 13 U/L (ref 0–37)
Albumin: 4 g/dL (ref 3.5–5.2)
Alkaline Phosphatase: 71 U/L (ref 39–117)
Bilirubin, Direct: 0 mg/dL (ref 0.0–0.3)
Total Bilirubin: 0.2 mg/dL (ref 0.2–1.2)
Total Protein: 6.4 g/dL (ref 6.0–8.3)

## 2019-03-10 LAB — LDL CHOLESTEROL, DIRECT: Direct LDL: 97 mg/dL

## 2019-03-15 ENCOUNTER — Other Ambulatory Visit: Payer: Self-pay | Admitting: Family Medicine

## 2019-03-15 DIAGNOSIS — I10 Essential (primary) hypertension: Secondary | ICD-10-CM

## 2019-03-31 DIAGNOSIS — M62830 Muscle spasm of back: Secondary | ICD-10-CM | POA: Diagnosis not present

## 2019-03-31 DIAGNOSIS — M9901 Segmental and somatic dysfunction of cervical region: Secondary | ICD-10-CM | POA: Diagnosis not present

## 2019-03-31 DIAGNOSIS — M9902 Segmental and somatic dysfunction of thoracic region: Secondary | ICD-10-CM | POA: Diagnosis not present

## 2019-03-31 DIAGNOSIS — M5033 Other cervical disc degeneration, cervicothoracic region: Secondary | ICD-10-CM | POA: Diagnosis not present

## 2019-04-12 ENCOUNTER — Other Ambulatory Visit: Payer: Self-pay | Admitting: Family Medicine

## 2019-04-17 ENCOUNTER — Other Ambulatory Visit: Payer: Self-pay | Admitting: Family Medicine

## 2019-04-26 DIAGNOSIS — M9903 Segmental and somatic dysfunction of lumbar region: Secondary | ICD-10-CM | POA: Diagnosis not present

## 2019-04-26 DIAGNOSIS — M5136 Other intervertebral disc degeneration, lumbar region: Secondary | ICD-10-CM | POA: Diagnosis not present

## 2019-04-26 DIAGNOSIS — M5033 Other cervical disc degeneration, cervicothoracic region: Secondary | ICD-10-CM | POA: Diagnosis not present

## 2019-04-26 DIAGNOSIS — M9901 Segmental and somatic dysfunction of cervical region: Secondary | ICD-10-CM | POA: Diagnosis not present

## 2019-05-05 ENCOUNTER — Encounter: Payer: Self-pay | Admitting: Family Medicine

## 2019-05-05 ENCOUNTER — Ambulatory Visit (INDEPENDENT_AMBULATORY_CARE_PROVIDER_SITE_OTHER): Payer: Medicare Other | Admitting: Family Medicine

## 2019-05-05 ENCOUNTER — Other Ambulatory Visit: Payer: Self-pay

## 2019-05-05 DIAGNOSIS — I10 Essential (primary) hypertension: Secondary | ICD-10-CM

## 2019-05-05 DIAGNOSIS — D509 Iron deficiency anemia, unspecified: Secondary | ICD-10-CM | POA: Diagnosis not present

## 2019-05-05 DIAGNOSIS — E785 Hyperlipidemia, unspecified: Secondary | ICD-10-CM

## 2019-05-05 DIAGNOSIS — G4700 Insomnia, unspecified: Secondary | ICD-10-CM | POA: Diagnosis not present

## 2019-05-05 NOTE — Assessment & Plan Note (Signed)
She will continue on her iron supplement.  She will have her labs rechecked.

## 2019-05-05 NOTE — Assessment & Plan Note (Signed)
-  Continue Lipitor °

## 2019-05-05 NOTE — Progress Notes (Signed)
Virtual Visit via video Note  This visit type was conducted due to national recommendations for restrictions regarding the COVID-19 pandemic (e.g. social distancing).  This format is felt to be most appropriate for this patient at this time.  All issues noted in this document were discussed and addressed.  No physical exam was performed (except for noted visual exam findings with Video Visits).   I connected with Audrey Hamilton today at 10:30 AM EST by a video enabled telemedicine application and verified that I am speaking with the correct person using two identifiers. Location patient: home Location provider: work Persons participating in the virtual visit: patient, provider  I discussed the limitations, risks, security and privacy concerns of performing an evaluation and management service by telephone and the availability of in person appointments. I also discussed with the patient that there may be a patient responsible charge related to this service. The patient expressed understanding and agreed to proceed.  Reason for visit: follow-up  HPI: Hypertension: Has been well controlled until about 2 weeks ago when it started to run in the [id number] systolically.  She notes over the last several days her BP has come down in to the normal range and was 129/84 today.  She is taking amlodipine and losartan.  No chest pain, shortness of breath, or edema.  She avoids salt.  She has been somewhat less active though no other changes to precipitate an elevated blood pressure.  Hyperlipidemia: Taking Lipitor.  No right upper quadrant pain or myalgias.  Insomnia: Continues on Ativan.  This is beneficial.  She gets 7 hours of sleep.  No drowsiness with this medication.  No alcohol intake.  Iron deficiency anemia: She did follow-up with GI.  The patient is status post gastric bypass and is likely not absorbing dietary iron very well.  They recommended she stay on iron supplementation indefinitely.  She  reports they advised her she did not need to complete the stool cards.  She has noted no blood in her stool.   ROS: See pertinent positives and negatives per HPI.  Past Medical History:  Diagnosis Date  . Allergy   . Anxiety    controlled  . Arthritis   . Chicken pox   . Colon polyps   . Depression   . Fibromyalgia   . History of hypothyroidism   . Hyperlipidemia   . Hypokalemia   . Multiple gastric ulcers   . Rheumatoid arthritis ([place]) Since 1995   currently in a flare up    Past Surgical History:  Procedure Laterality Date  . ABDOMINAL HYSTERECTOMY  1974  . AUGMENTATION MAMMAPLASTY Bilateral [id number]   silicone  . COLONOSCOPY WITH PROPOFOL N/A 07/04/2018   Procedure: COLONOSCOPY WITH PROPOFOL;  Surgeon: Manya Silvas, MD;  Location: [facility] ENDOSCOPY;  Service: Endoscopy;  Laterality: N/A;  . EYE SURGERY    . FOOT SURGERY Left 11/22/10  . MASTECTOMY Bilateral 1970's   SUBCUTANEOUS MASTECTOMY - NO CANCER  . SMALL INTESTINE SURGERY  12/2001  . STOMACH SURGERY  12/2001  . TONSILLECTOMY AND ADENOIDECTOMY  1966  . UPPER GASTROINTESTINAL ENDOSCOPY      Family History  Problem Relation Age of Onset  . Arthritis Mother   . Sudden death Mother        after birth of pt who was 103 days old.  . Depression Mother   . Arthritis Maternal Grandmother   . Cancer Maternal Grandmother   . Hyperlipidemia Maternal Grandfather   . Diabetes Mellitus II  Father   . Liver disease Father   . Rheum arthritis Son   . Stroke Son   . Breast cancer Neg Hx     SOCIAL HX: Former smoker   Current Outpatient Medications:  .  amLODipine (NORVASC) 10 MG tablet, TAKE 1 TABLET BY MOUTH EVERY DAY, Disp: 90 tablet, Rfl: 1 .  atorvastatin (LIPITOR) 80 MG tablet, Take 1 tablet (80 mg total) by mouth daily., Disp: 90 tablet, Rfl: 1 .  Ferrous Fum-Iron Polysacch (TANDEM PO), Take 106 mg by mouth daily., Disp: , Rfl:  .  fluticasone (FLONASE) 50 MCG/ACT nasal spray, Place into the nose at bedtime.  Reported on 10/01/2015, Disp: , Rfl:  .  leflunomide (ARAVA) 10 MG tablet, 1 tab daily x 90 days, Disp: , Rfl:  .  Levocetirizine Dihydrochloride (XYZAL ALLERGY 24HR PO), Take 1 tablet by mouth daily., Disp: , Rfl:  .  LORazepam (ATIVAN) 1 MG tablet, TAKE 1 TABLET (1 MG TOTAL) BY MOUTH AT BEDTIME., Disp: 30 tablet, Rfl: 1 .  losartan (COZAAR) 100 MG tablet, TAKE 1/2 TABLET BY MOUTH DAILY, Disp: 45 tablet, Rfl: 1 .  montelukast (SINGULAIR) 10 MG tablet, TAKE 1 TABLET BY MOUTH EVERYDAY AT BEDTIME, Disp: 90 tablet, Rfl: 0 .  Omega-3 Fatty Acids (OMEGA-3 FISH OIL) 300 MG CAPS, Take by mouth., Disp: , Rfl:  .  omeprazole (PRILOSEC) 20 MG capsule, Take by mouth., Disp: , Rfl:  .  potassium chloride SA (K-DUR,KLOR-CON) 20 MEQ tablet, Take 1 tablet (20 mEq total) by mouth daily., Disp: 6 tablet, Rfl: 0 .  traMADol (ULTRAM) 50 MG tablet, TAKE 1 TABLET (50 MG TOTAL) BY MOUTH EVERY 8 (EIGHT) HOURS AS NEEDED., Disp: 90 tablet, Rfl: 0  EXAM:  VITALS per patient if applicable:  GENERAL: alert, oriented, appears well and in no acute distress  HEENT: atraumatic, conjunttiva clear, no obvious abnormalities on inspection of external nose and ears  NECK: normal movements of the head and neck  LUNGS: on inspection no signs of respiratory distress, breathing rate appears normal, no obvious gross SOB, gasping or wheezing  CV: no obvious cyanosis  MS: moves all visible extremities without noticeable abnormality  PSYCH/NEURO: pleasant and cooperative, no obvious depression or anxiety, speech and thought processing grossly intact  ASSESSMENT AND PLAN:  Discussed the following assessment and plan:  Essential hypertension Patient had a period of time where this was above goal though over the last couple of days has come down to normal range.  She will monitor for an additional week.  She will send [place] her readings and if consistently elevated we will add additional medication.  She will come in for lab work in  1 week.  HLD (hyperlipidemia) Continue Lipitor.  Insomnia Stable on lorazepam.  To let us know when she needs refill.  Iron deficiency anemia She will continue on her iron supplement.  She will have her labs rechecked.   Orders Placed This Encounter  Procedures  . IBC + Ferritin    Standing Status:   Future    Standing Expiration Date:   05/04/2020  . CBC    Standing Status:   Future    Standing Expiration Date:   05/04/2020  . Basic Metabolic Panel (BMET)    Standing Status:   Future    Standing Expiration Date:   05/04/2020    No orders of the defined types were placed in this encounter.    I discussed the assessment and treatment plan with the patient. The patient  was provided an opportunity to ask questions and all were answered. The patient agreed with the plan and demonstrated an understanding of the instructions.   The patient was advised to call back or seek an in-person evaluation if the symptoms worsen or if the condition fails to improve as anticipated.    Tommi Rumps, MD

## 2019-05-05 NOTE — Assessment & Plan Note (Signed)
Stable on lorazepam.  To let us know when she needs refill.

## 2019-05-05 NOTE — Assessment & Plan Note (Signed)
Patient had a period of time where this was above goal though over the last couple of days has come down to normal range.  She will monitor for an additional week.  She will send [place] her readings and if consistently elevated we will add additional medication.  She will come in for lab work in 1 week.

## 2019-05-12 ENCOUNTER — Other Ambulatory Visit (INDEPENDENT_AMBULATORY_CARE_PROVIDER_SITE_OTHER): Payer: Medicare Other

## 2019-05-12 ENCOUNTER — Encounter: Payer: Self-pay | Admitting: Family Medicine

## 2019-05-12 ENCOUNTER — Other Ambulatory Visit: Payer: Self-pay

## 2019-05-12 DIAGNOSIS — D509 Iron deficiency anemia, unspecified: Secondary | ICD-10-CM | POA: Diagnosis not present

## 2019-05-12 DIAGNOSIS — I10 Essential (primary) hypertension: Secondary | ICD-10-CM

## 2019-05-13 LAB — CBC
Hematocrit: 39.3 % (ref 34.0–46.6)
Hemoglobin: 12.3 g/dL (ref 11.1–15.9)
MCH: 27.3 pg (ref 26.6–33.0)
MCHC: 31.3 g/dL — ABNORMAL LOW (ref 31.5–35.7)
MCV: 87 fL (ref 79–97)
Platelets: 312 10*3/uL (ref 150–450)
RBC: 4.5 x10E6/uL (ref 3.77–5.28)
RDW: 15.6 % — ABNORMAL HIGH (ref 11.7–15.4)
WBC: 7.8 10*3/uL (ref 3.4–10.8)

## 2019-05-13 LAB — BASIC METABOLIC PANEL
BUN/Creatinine Ratio: 30 — ABNORMAL HIGH (ref 12–28)
BUN: 13 mg/dL (ref 8–27)
CO2: 21 mmol/L (ref 20–29)
Calcium: 9.5 mg/dL (ref 8.7–10.3)
Chloride: 104 mmol/L (ref 96–106)
Creatinine, Ser: 0.44 mg/dL — ABNORMAL LOW (ref 0.57–1.00)
GFR calc Af Amer: 117 mL/min/{1.73_m2} (ref >59)
GFR calc non Af Amer: 101 mL/min/{1.73_m2} (ref >59)
Glucose: 64 mg/dL — ABNORMAL LOW (ref 65–99)
Potassium: 4.3 mmol/L (ref 3.5–5.2)
Sodium: 141 mmol/L (ref 134–144)

## 2019-05-13 LAB — IRON,TIBC AND FERRITIN PANEL
Ferritin: 81 ng/mL (ref 15–150)
Iron Saturation: 8 % — CL (ref 15–55)
Iron: 22 ug/dL — ABNORMAL LOW (ref 27–139)
Total Iron Binding Capacity: 278 ug/dL (ref 250–450)
UIBC: 256 ug/dL (ref 118–369)

## 2019-05-19 ENCOUNTER — Other Ambulatory Visit: Payer: Self-pay | Admitting: Family Medicine

## 2019-05-24 ENCOUNTER — Encounter: Payer: Self-pay | Admitting: Family Medicine

## 2019-05-24 ENCOUNTER — Ambulatory Visit (INDEPENDENT_AMBULATORY_CARE_PROVIDER_SITE_OTHER): Payer: Medicare Other | Admitting: Family Medicine

## 2019-05-24 ENCOUNTER — Other Ambulatory Visit: Payer: Self-pay

## 2019-05-24 VITALS — BP 154/70 | Ht 64.0 in | Wt 171.0 lb

## 2019-05-24 DIAGNOSIS — E162 Hypoglycemia, unspecified: Secondary | ICD-10-CM

## 2019-05-24 DIAGNOSIS — R42 Dizziness and giddiness: Secondary | ICD-10-CM | POA: Diagnosis not present

## 2019-05-24 NOTE — Assessment & Plan Note (Signed)
Concerning for orthostasis.  BP has been mildly elevated and thus I am hesitant to change any blood pressure medications.  We will have her come in for nurse visit for orthostatic blood pressures.  We will also recheck her glucose to see if it remains low on fasting testing.  Discussed reasons to seek medical attention in the emergency room.

## 2019-05-24 NOTE — Progress Notes (Signed)
Virtual Visit via video Note  This visit type was conducted due to national recommendations for restrictions regarding the COVID-19 pandemic (e.g. social distancing).  This format is felt to be most appropriate for this patient at this time.  All issues noted in this document were discussed and addressed.  No physical exam was performed (except for noted visual exam findings with Video Visits).   I connected with Audrey Hamilton today at  3:15 PM EST by a video enabled telemedicine application and verified that I am speaking with the correct person using two identifiers. Location patient: home Location provider: work Persons participating in the virtual visit: patient, provider  I discussed the limitations, risks, security and privacy concerns of performing an evaluation and management service by telephone and the availability of in person appointments. I also discussed with the patient that there may be a patient responsible charge related to this service. The patient expressed understanding and agreed to proceed.   Reason for visit: Same day visit.  HPI: Lightheadedness: Patient notes for the last couple weeks she has had some lightheadedness when she would bend over or when she would get up from lying down in bed.  She notes no syncope.  No palpitations, chest pain, headaches, or abdominal issues, vertigo, hearing issues, ear fullness, or tinnitus.  She checked her blood pressure on several occasions and its been in the 140-150/70s range.  Prior to that it was in the normal range.  Her glucose was found to be low on most recent lab work though she had been fasting for her afternoon labs.  She notes she does drink lots of water throughout the day.   ROS: See pertinent positives and negatives per HPI.  Past Medical History:  Diagnosis Date  . Allergy   . Anxiety    controlled  . Arthritis   . Chicken pox   . Colon polyps   . Depression   . Fibromyalgia   . History of  hypothyroidism   . Hyperlipidemia   . Hypokalemia   . Multiple gastric ulcers   . Rheumatoid arthritis ([place]) Since 1995   currently in a flare up    Past Surgical History:  Procedure Laterality Date  . ABDOMINAL HYSTERECTOMY  1974  . AUGMENTATION MAMMAPLASTY Bilateral [id number]   silicone  . COLONOSCOPY WITH PROPOFOL N/A 07/04/2018   Procedure: COLONOSCOPY WITH PROPOFOL;  Surgeon: Manya Silvas, MD;  Location: [facility] ENDOSCOPY;  Service: Endoscopy;  Laterality: N/A;  . EYE SURGERY    . FOOT SURGERY Left 11/22/10  . MASTECTOMY Bilateral 1970's   SUBCUTANEOUS MASTECTOMY - NO CANCER  . SMALL INTESTINE SURGERY  12/2001  . STOMACH SURGERY  12/2001  . TONSILLECTOMY AND ADENOIDECTOMY  1966  . UPPER GASTROINTESTINAL ENDOSCOPY      Family History  Problem Relation Age of Onset  . Arthritis Mother   . Sudden death Mother        after birth of pt who was 42 days old.  . Depression Mother   . Arthritis Maternal Grandmother   . Cancer Maternal Grandmother   . Hyperlipidemia Maternal Grandfather   . Diabetes Mellitus II Father   . Liver disease Father   . Rheum arthritis Son   . Stroke Son   . Breast cancer Neg Hx     SOCIAL HX: Former smoker   Current Outpatient Medications:  .  amLODipine (NORVASC) 10 MG tablet, TAKE 1 TABLET BY MOUTH EVERY DAY, Disp: 90 tablet, Rfl: 1 .  atorvastatin (  LIPITOR) 80 MG tablet, Take 1 tablet (80 mg total) by mouth daily., Disp: 90 tablet, Rfl: 1 .  Ferrous Fum-Iron Polysacch (TANDEM PO), Take 106 mg by mouth daily., Disp: , Rfl:  .  fluticasone (FLONASE) 50 MCG/ACT nasal spray, Place into the nose at bedtime. Reported on 10/01/2015, Disp: , Rfl:  .  leflunomide (ARAVA) 10 MG tablet, 1 tab daily x 90 days, Disp: , Rfl:  .  Levocetirizine Dihydrochloride (XYZAL ALLERGY 24HR PO), Take 1 tablet by mouth daily., Disp: , Rfl:  .  LORazepam (ATIVAN) 1 MG tablet, TAKE 1 TABLET (1 MG TOTAL) BY MOUTH AT BEDTIME., Disp: 30 tablet, Rfl: 1 .  losartan (COZAAR) 100  MG tablet, TAKE 1/2 TABLET BY MOUTH DAILY, Disp: 45 tablet, Rfl: 1 .  montelukast (SINGULAIR) 10 MG tablet, TAKE 1 TABLET BY MOUTH EVERYDAY AT BEDTIME, Disp: 90 tablet, Rfl: 0 .  Omega-3 Fatty Acids (OMEGA-3 FISH OIL) 300 MG CAPS, Take by mouth., Disp: , Rfl:  .  omeprazole (PRILOSEC) 20 MG capsule, Take by mouth., Disp: , Rfl:  .  potassium chloride SA (K-DUR,KLOR-CON) 20 MEQ tablet, Take 1 tablet (20 mEq total) by mouth daily., Disp: 6 tablet, Rfl: 0 .  traMADol (ULTRAM) 50 MG tablet, TAKE 1 TABLET (50 MG TOTAL) BY MOUTH EVERY 8 (EIGHT) HOURS AS NEEDED., Disp: 90 tablet, Rfl: 0  EXAM:  VITALS per patient if applicable:  GENERAL: alert, oriented, appears well and in no acute distress  HEENT: atraumatic, conjunttiva clear, no obvious abnormalities on inspection of external nose and ears  NECK: normal movements of the head and neck  LUNGS: on inspection no signs of respiratory distress, breathing rate appears normal, no obvious gross SOB, gasping or wheezing  CV: no obvious cyanosis  MS: moves all visible extremities without noticeable abnormality  PSYCH/NEURO: pleasant and cooperative, no obvious depression or anxiety, speech and thought processing grossly intact  ASSESSMENT AND PLAN:  Discussed the following assessment and plan:  Lightheadedness Concerning for orthostasis.  BP has been mildly elevated and thus I am hesitant to change any blood pressure medications.  We will have her come in for nurse visit for orthostatic blood pressures.  We will also recheck her glucose to see if it remains low on fasting testing.  Discussed reasons to seek medical attention in the emergency room.   Orders Placed This Encounter  Procedures  . POCT CBG (Fasting - Glucose)    Standing Status:   Future    Standing Expiration Date:   06/21/2019    No orders of the defined types were placed in this encounter.    I discussed the assessment and treatment plan with the patient. The patient was  provided an opportunity to ask questions and all were answered. The patient agreed with the plan and demonstrated an understanding of the instructions.   The patient was advised to call back or seek an in-person evaluation if the symptoms worsen or if the condition fails to improve as anticipated.    Tommi Rumps, MD

## 2019-05-25 ENCOUNTER — Ambulatory Visit: Payer: Medicare Other | Attending: Internal Medicine

## 2019-05-25 DIAGNOSIS — Z23 Encounter for immunization: Secondary | ICD-10-CM | POA: Insufficient documentation

## 2019-05-26 ENCOUNTER — Telehealth: Payer: Self-pay | Admitting: Family Medicine

## 2019-05-26 NOTE — Telephone Encounter (Signed)
LVM to set up 1 wk BP check

## 2019-05-31 ENCOUNTER — Ambulatory Visit (INDEPENDENT_AMBULATORY_CARE_PROVIDER_SITE_OTHER): Payer: Medicare Other | Admitting: Lab

## 2019-05-31 VITALS — Temp 96.9°F

## 2019-05-31 DIAGNOSIS — E162 Hypoglycemia, unspecified: Secondary | ICD-10-CM

## 2019-05-31 NOTE — Progress Notes (Signed)
Pt in office today for BP check which was 139/83 sitting, 159/82 standing, 162/85 laying down. Pt was also here for  Glucose check which was 88.

## 2019-06-08 NOTE — Progress Notes (Signed)
Noted. No cause for her light headedness on her BPs or glucose. Please see if the light headedness has persisted.

## 2019-06-14 ENCOUNTER — Other Ambulatory Visit: Payer: Self-pay | Admitting: Family Medicine

## 2019-06-14 NOTE — Telephone Encounter (Signed)
.  Refill request for Ativan , last seen 05-31-19, last filled 04-18-19.  Please advise.

## 2019-06-20 ENCOUNTER — Ambulatory Visit: Payer: Medicare Other

## 2019-06-21 DIAGNOSIS — M9901 Segmental and somatic dysfunction of cervical region: Secondary | ICD-10-CM | POA: Diagnosis not present

## 2019-06-21 DIAGNOSIS — M5136 Other intervertebral disc degeneration, lumbar region: Secondary | ICD-10-CM | POA: Diagnosis not present

## 2019-06-21 DIAGNOSIS — M9903 Segmental and somatic dysfunction of lumbar region: Secondary | ICD-10-CM | POA: Diagnosis not present

## 2019-06-21 DIAGNOSIS — M5033 Other cervical disc degeneration, cervicothoracic region: Secondary | ICD-10-CM | POA: Diagnosis not present

## 2019-06-24 ENCOUNTER — Ambulatory Visit: Payer: Medicare Other | Attending: Internal Medicine

## 2019-06-24 DIAGNOSIS — Z23 Encounter for immunization: Secondary | ICD-10-CM | POA: Insufficient documentation

## 2019-06-24 NOTE — Progress Notes (Signed)
   Covid-19 Vaccination Clinic  Name:  Audrey Hamilton    MRN: [id number] DOB: Feb 25, 1946  06/24/2019  [name] was observed post Covid-19 immunization for 15 minutes without incident. She was provided with Vaccine Information Sheet and instruction to access the V-Safe system.   [name] was instructed to call 911 with any severe reactions post vaccine: Marland Kitchen Difficulty breathing  . Swelling of face and throat  . A fast heartbeat  . A bad rash all over body  . Dizziness and weakness   Immunizations Administered    Name Date Dose VIS Date Route   Pfizer COVID-19 Vaccine 06/24/2019 10:13 AM 0.3 mL 03/31/2019 Intramuscular   Manufacturer: [place]   Lot: [id number]   [place]: [id number]

## 2019-06-27 DIAGNOSIS — M170 Bilateral primary osteoarthritis of knee: Secondary | ICD-10-CM | POA: Diagnosis not present

## 2019-06-27 DIAGNOSIS — Z79899 Other long term (current) drug therapy: Secondary | ICD-10-CM | POA: Diagnosis not present

## 2019-06-27 DIAGNOSIS — M0609 Rheumatoid arthritis without rheumatoid factor, multiple sites: Secondary | ICD-10-CM | POA: Diagnosis not present

## 2019-07-05 ENCOUNTER — Ambulatory Visit: Payer: Medicare Other

## 2019-07-14 ENCOUNTER — Other Ambulatory Visit: Payer: Self-pay | Admitting: Family Medicine

## 2019-07-14 NOTE — Telephone Encounter (Signed)
Last refill: 1.31.21 #90, 0 Last OV: 2.3.21 dx. Hypoglycemia

## 2019-07-25 ENCOUNTER — Other Ambulatory Visit: Payer: Medicare Other

## 2019-07-26 ENCOUNTER — Other Ambulatory Visit: Payer: Self-pay | Admitting: Family Medicine

## 2019-07-26 DIAGNOSIS — E782 Mixed hyperlipidemia: Secondary | ICD-10-CM

## 2019-07-31 ENCOUNTER — Other Ambulatory Visit: Payer: Self-pay

## 2019-07-31 ENCOUNTER — Encounter: Payer: Self-pay | Admitting: Family Medicine

## 2019-07-31 ENCOUNTER — Ambulatory Visit (INDEPENDENT_AMBULATORY_CARE_PROVIDER_SITE_OTHER): Payer: Medicare Other | Admitting: Family Medicine

## 2019-07-31 DIAGNOSIS — G4700 Insomnia, unspecified: Secondary | ICD-10-CM

## 2019-07-31 DIAGNOSIS — R42 Dizziness and giddiness: Secondary | ICD-10-CM

## 2019-07-31 DIAGNOSIS — L989 Disorder of the skin and subcutaneous tissue, unspecified: Secondary | ICD-10-CM | POA: Insufficient documentation

## 2019-07-31 NOTE — Assessment & Plan Note (Signed)
Improved.  She will continue to rise slowly from seated position.  If worsens would consider decreasing blood pressure medication dosages.

## 2019-07-31 NOTE — Assessment & Plan Note (Signed)
Stable on lorazepam.  She can continue this.  Discussed monitoring for drowsiness.

## 2019-07-31 NOTE — Assessment & Plan Note (Signed)
Refer to dermatology 

## 2019-07-31 NOTE — Patient Instructions (Signed)
Nice to see you. I have referred you to dermatology.  If you do not hear anything in the next week please let us know.

## 2019-07-31 NOTE — Progress Notes (Signed)
  Tommi Rumps, MD Phone: [phone]  Audrey Hamilton is a 74 y.o. female who presents today for same day visit.   Skin lesion: Came up about 3 weeks ago.  Started as a red dot in the next morning.  Just as it does below in the picture.  It is sore.  It has not changed since then.  It has not gotten bigger.  She does not have a dermatologist.  Lightheadedness: Patient notes this is improved.  She rises slowly from seated positions.  She continues on her prior blood pressure medicines.  Insomnia: Patient continues on lorazepam.  It does not make her drowsy.  No alcohol intake.  It is helpful.  Social History   Tobacco Use  Smoking Status Former Smoker  . Quit date: 06/19/1995  . Years since quitting: 24.1  Smokeless Tobacco Never Used     ROS see history of present illness  Objective  Physical Exam Vitals:   07/31/19 1317  BP: 120/80  Pulse: 86  Temp: 97.9 F (36.6 C)  SpO2: 97%    BP Readings from Last 3 Encounters:  07/31/19 120/80  05/24/19 (!) 154/70  05/05/19 129/84   Wt Readings from Last 3 Encounters:  07/31/19 177 lb 3.2 oz (80.4 kg)  05/24/19 171 lb (77.6 kg)  05/05/19 171 lb 8 oz (77.8 kg)    Physical Exam Constitutional:      General: She is not in acute distress. Pulmonary:     Effort: Pulmonary effort is normal.  Neurological:     Mental Status: She is alert.    Right forehead   Assessment/Plan: Please see individual problem list.  Skin lesion Refer to dermatology.   Lightheadedness Improved.  She will continue to rise slowly from seated position.  If worsens would consider decreasing blood pressure medication dosages.  Insomnia Stable on lorazepam.  She can continue this.  Discussed monitoring for drowsiness.    Orders Placed This Encounter  Procedures  . Ambulatory referral to Dermatology    Referral Priority:   Routine    Referral Type:   Consultation    Referral Reason:   Specialty Services Required    Requested  Specialty:   Dermatology    Number of Visits Requested:   1    No orders of the defined types were placed in this encounter.   This visit occurred during the SARS-CoV-2 public health emergency.  Safety protocols were in place, including screening questions prior to the visit, additional usage of staff PPE, and extensive cleaning of exam room while observing appropriate contact time as indicated for disinfecting solutions.    Tommi Rumps, MD [place]

## 2019-08-06 ENCOUNTER — Other Ambulatory Visit: Payer: Self-pay | Admitting: Family Medicine

## 2019-08-10 ENCOUNTER — Other Ambulatory Visit: Payer: Self-pay | Admitting: Family Medicine

## 2019-08-16 DIAGNOSIS — M5033 Other cervical disc degeneration, cervicothoracic region: Secondary | ICD-10-CM | POA: Diagnosis not present

## 2019-08-16 DIAGNOSIS — M9903 Segmental and somatic dysfunction of lumbar region: Secondary | ICD-10-CM | POA: Diagnosis not present

## 2019-08-16 DIAGNOSIS — M9901 Segmental and somatic dysfunction of cervical region: Secondary | ICD-10-CM | POA: Diagnosis not present

## 2019-08-16 DIAGNOSIS — M5136 Other intervertebral disc degeneration, lumbar region: Secondary | ICD-10-CM | POA: Diagnosis not present

## 2019-08-23 ENCOUNTER — Other Ambulatory Visit: Payer: Self-pay | Admitting: Family Medicine

## 2019-08-23 DIAGNOSIS — H5203 Hypermetropia, bilateral: Secondary | ICD-10-CM | POA: Diagnosis not present

## 2019-08-23 DIAGNOSIS — Z961 Presence of intraocular lens: Secondary | ICD-10-CM | POA: Diagnosis not present

## 2019-08-23 DIAGNOSIS — H52223 Regular astigmatism, bilateral: Secondary | ICD-10-CM | POA: Diagnosis not present

## 2019-08-23 DIAGNOSIS — H524 Presbyopia: Secondary | ICD-10-CM | POA: Diagnosis not present

## 2019-08-23 DIAGNOSIS — E782 Mixed hyperlipidemia: Secondary | ICD-10-CM

## 2019-08-23 DIAGNOSIS — H43813 Vitreous degeneration, bilateral: Secondary | ICD-10-CM | POA: Diagnosis not present

## 2019-08-23 NOTE — Telephone Encounter (Signed)
Refill request for tramadol, last seen 07-31-19, last filled 07-14-19.  Please advise.

## 2019-09-05 ENCOUNTER — Encounter: Payer: Self-pay | Admitting: Family Medicine

## 2019-09-05 ENCOUNTER — Other Ambulatory Visit: Payer: Self-pay

## 2019-09-05 ENCOUNTER — Ambulatory Visit (INDEPENDENT_AMBULATORY_CARE_PROVIDER_SITE_OTHER): Payer: Medicare Other | Admitting: Family Medicine

## 2019-09-05 DIAGNOSIS — I10 Essential (primary) hypertension: Secondary | ICD-10-CM | POA: Diagnosis not present

## 2019-09-05 DIAGNOSIS — E785 Hyperlipidemia, unspecified: Secondary | ICD-10-CM | POA: Diagnosis not present

## 2019-09-05 DIAGNOSIS — E669 Obesity, unspecified: Secondary | ICD-10-CM | POA: Diagnosis not present

## 2019-09-05 DIAGNOSIS — J302 Other seasonal allergic rhinitis: Secondary | ICD-10-CM | POA: Diagnosis not present

## 2019-09-05 DIAGNOSIS — L989 Disorder of the skin and subcutaneous tissue, unspecified: Secondary | ICD-10-CM

## 2019-09-05 DIAGNOSIS — E66811 Obesity, class 1: Secondary | ICD-10-CM | POA: Insufficient documentation

## 2019-09-05 MED ORDER — LORATADINE 10 MG PO TABS: 10.0000 mg | ORAL_TABLET | Freq: Every day | ORAL | 11 refills | Status: DC

## 2019-09-05 NOTE — Progress Notes (Signed)
Tommi Rumps, MD Phone: [phone]  Audrey Hamilton is a 74 y.o. female who presents today for f/u.  HYPERTENSION  Disease Monitoring  Home BP Monitoring not checking Chest pain- no    Dyspnea- no Medications  Compliance-  Taking amlodipine, losartan.     HYPERLIPIDEMIA Symptoms Chest pain on exertion:  no   Medications: Compliance- taking lipitor Right upper quadrant pain- no  Muscle aches- no  Allergic rhinitis: This is been going on for several months.  Typically occurs this time of year.  She has been taking Singulair.  She ran out of Claritin.  She notes sneezing, cough, and runny eyes.  No longer using Flonase.  Skin lesion: This is on her right forehead.  She notes this has not changed since I last saw her.  She sees dermatology in June.  Obesity: Patient notes she is going to start working on her diet.  She is going to cut down on her portions and eat more vegetables and salads.  She will use vinegar and oil as a dressing.  She has been riding a bike 3 days a week and plans to increase this and walk more.    Social History   Tobacco Use  Smoking Status Former Smoker  . Quit date: 06/19/1995  . Years since quitting: 24.2  Smokeless Tobacco Never Used     ROS see history of present illness  Objective  Physical Exam Vitals:   09/05/19 1049 09/05/19 1053  BP: 139/70 130/79  Pulse: 97   Temp: 97.8 F (36.6 C)   SpO2: 96%     BP Readings from Last 3 Encounters:  09/05/19 130/79  07/31/19 120/80  05/24/19 (!) 154/70   Wt Readings from Last 3 Encounters:  09/05/19 179 lb (81.2 kg)  07/31/19 177 lb 3.2 oz (80.4 kg)  05/24/19 171 lb (77.6 kg)    Physical Exam Constitutional:      General: She is not in acute distress.    Appearance: She is not diaphoretic.  HENT:     Head:   Cardiovascular:     Rate and Rhythm: Normal rate and regular rhythm.     Heart sounds: Normal heart sounds.  Pulmonary:     Effort: Pulmonary effort is normal.   Breath sounds: Normal breath sounds.  Musculoskeletal:     Right lower leg: No edema.     Left lower leg: No edema.  Skin:    General: Skin is warm and dry.  Neurological:     Mental Status: She is alert.      Assessment/Plan: Please see individual problem list.  Essential hypertension Adequate control.  Continue current medication.  HLD (hyperlipidemia) Continue Lipitor.  Skin lesion Stable.  She will see dermatology as planned.  Allergic rhinitis Has had issues with this during this allergy season.  She will continue Singulair.  We will add back Claritin.  Could add in Flonase if needed.  Obesity (BMI 30.0-34.9) Encouraged dietary changes as she described.  She will try to increase her activity to 4 days a week.   No orders of the defined types were placed in this encounter.   Meds ordered this encounter  Medications  . loratadine (CLARITIN) 10 MG tablet    Sig: Take 1 tablet (10 mg total) by mouth daily.    Dispense:  30 tablet    Refill:  11    This visit occurred during the SARS-CoV-2 public health emergency.  Safety protocols were in place, including screening questions prior  to the visit, additional usage of staff PPE, and extensive cleaning of exam room while observing appropriate contact time as indicated for disinfecting solutions.    Tommi Rumps, MD [place]

## 2019-09-05 NOTE — Assessment & Plan Note (Signed)
Encouraged dietary changes as she described.  She will try to increase her activity to 4 days a week.

## 2019-09-05 NOTE — Patient Instructions (Signed)
Nice to see you. Please see the dermatologist. Please work on diet and exercise as we discussed. I will refill your Claritin.

## 2019-09-05 NOTE — Assessment & Plan Note (Signed)
Adequate control.  Continue current medication. 

## 2019-09-05 NOTE — Assessment & Plan Note (Signed)
Has had issues with this during this allergy season.  She will continue Singulair.  We will add back Claritin.  Could add in Flonase if needed.

## 2019-09-05 NOTE — Assessment & Plan Note (Signed)
Stable.  She will see dermatology as planned.

## 2019-09-05 NOTE — Assessment & Plan Note (Signed)
-  Continue Lipitor °

## 2019-09-08 ENCOUNTER — Other Ambulatory Visit: Payer: Self-pay | Admitting: Family Medicine

## 2019-09-08 DIAGNOSIS — I10 Essential (primary) hypertension: Secondary | ICD-10-CM

## 2019-09-27 DIAGNOSIS — M5136 Other intervertebral disc degeneration, lumbar region: Secondary | ICD-10-CM | POA: Diagnosis not present

## 2019-09-27 DIAGNOSIS — M0609 Rheumatoid arthritis without rheumatoid factor, multiple sites: Secondary | ICD-10-CM | POA: Diagnosis not present

## 2019-09-27 DIAGNOSIS — M5033 Other cervical disc degeneration, cervicothoracic region: Secondary | ICD-10-CM | POA: Diagnosis not present

## 2019-09-27 DIAGNOSIS — Z79899 Other long term (current) drug therapy: Secondary | ICD-10-CM | POA: Diagnosis not present

## 2019-09-27 DIAGNOSIS — M9901 Segmental and somatic dysfunction of cervical region: Secondary | ICD-10-CM | POA: Diagnosis not present

## 2019-09-27 DIAGNOSIS — M9903 Segmental and somatic dysfunction of lumbar region: Secondary | ICD-10-CM | POA: Diagnosis not present

## 2019-10-06 ENCOUNTER — Other Ambulatory Visit: Payer: Self-pay | Admitting: Family Medicine

## 2019-10-06 NOTE — Telephone Encounter (Signed)
Refill request for ativan, last seen 09-05-19, last filled 08-11-19.  Please advise.

## 2019-10-09 ENCOUNTER — Other Ambulatory Visit: Payer: Self-pay | Admitting: Family Medicine

## 2019-10-09 ENCOUNTER — Other Ambulatory Visit: Payer: Self-pay

## 2019-10-09 ENCOUNTER — Telehealth: Payer: Self-pay | Admitting: Family Medicine

## 2019-10-09 NOTE — Telephone Encounter (Signed)
Refilled: 08/23/2019 Last OV: 09/05/2019 Next OV: 01/09/2020

## 2019-10-09 NOTE — Telephone Encounter (Signed)
Pt needs a refill on traMADol (ULTRAM) 50 MG tablet °

## 2019-10-12 ENCOUNTER — Ambulatory Visit (INDEPENDENT_AMBULATORY_CARE_PROVIDER_SITE_OTHER): Payer: Medicare Other | Admitting: Dermatology

## 2019-10-12 ENCOUNTER — Other Ambulatory Visit: Payer: Self-pay

## 2019-10-12 DIAGNOSIS — L821 Other seborrheic keratosis: Secondary | ICD-10-CM | POA: Diagnosis not present

## 2019-10-12 DIAGNOSIS — L578 Other skin changes due to chronic exposure to nonionizing radiation: Secondary | ICD-10-CM | POA: Diagnosis not present

## 2019-10-12 DIAGNOSIS — L820 Inflamed seborrheic keratosis: Secondary | ICD-10-CM | POA: Diagnosis not present

## 2019-10-12 NOTE — Progress Notes (Signed)
   New Patient Visit  Subjective  Audrey Hamilton is a 74 y.o. female who presents for the following: lesion (on the right forehead x 1 month - new, irregular, and tender).  The following portions of the chart were reviewed this encounter and updated as appropriate:  Tobacco  Allergies  Meds  Problems  Med Hx  Surg Hx  Fam Hx     Review of Systems:  No other skin or systemic complaints except as noted in HPI or Assessment and Plan.  Objective  Well appearing patient in no apparent distress; mood and affect are within normal limits.  A focused examination was performed including the face . Relevant physical exam findings are noted in the Assessment and Plan.  Objective  R sup forehead: Erythematous keratotic or waxy stuck-on papule or plaque.   Assessment & Plan    Inflamed seborrheic keratosis R sup forehead  Destruction of lesion - R sup forehead Complexity: simple   Destruction method: cryotherapy   Informed consent: discussed and consent obtained   Timeout:  patient name, date of birth, surgical site, and procedure verified Lesion destroyed using liquid nitrogen: Yes   Region frozen until ice ball extended beyond lesion: Yes   Outcome: patient tolerated procedure well with no complications   Post-procedure details: wound care instructions given     Seborrheic Keratoses - Stuck-on, waxy, tan-brown papules and plaques  - Discussed benign etiology and prognosis. - Observe - Call for any changes  Actinic Damage - diffuse scaly erythematous macules with underlying dyspigmentation - Recommend daily broad spectrum sunscreen SPF 30+ to sun-exposed areas, reapply every 2 hours as needed.  - Call for new or changing lesions.   Return in about 2 months (around 12/12/2019) for ISK recheck .  Luther Redo, CMA, am acting as scribe for Sarina Ser, MD .  Documentation: I have reviewed the above documentation for accuracy and completeness, and I agree with the  above.  Sarina Ser, MD

## 2019-10-19 ENCOUNTER — Encounter: Payer: Self-pay | Admitting: Dermatology

## 2019-10-26 DIAGNOSIS — M0609 Rheumatoid arthritis without rheumatoid factor, multiple sites: Secondary | ICD-10-CM | POA: Diagnosis not present

## 2019-10-26 DIAGNOSIS — M170 Bilateral primary osteoarthritis of knee: Secondary | ICD-10-CM | POA: Diagnosis not present

## 2019-10-26 DIAGNOSIS — Z79899 Other long term (current) drug therapy: Secondary | ICD-10-CM | POA: Diagnosis not present

## 2019-11-01 DIAGNOSIS — M5033 Other cervical disc degeneration, cervicothoracic region: Secondary | ICD-10-CM | POA: Diagnosis not present

## 2019-11-01 DIAGNOSIS — M9903 Segmental and somatic dysfunction of lumbar region: Secondary | ICD-10-CM | POA: Diagnosis not present

## 2019-11-01 DIAGNOSIS — M5136 Other intervertebral disc degeneration, lumbar region: Secondary | ICD-10-CM | POA: Diagnosis not present

## 2019-11-01 DIAGNOSIS — M9901 Segmental and somatic dysfunction of cervical region: Secondary | ICD-10-CM | POA: Diagnosis not present

## 2019-11-02 ENCOUNTER — Ambulatory Visit (INDEPENDENT_AMBULATORY_CARE_PROVIDER_SITE_OTHER): Payer: Medicare Other

## 2019-11-02 VITALS — Ht 64.0 in | Wt 176.0 lb

## 2019-11-02 DIAGNOSIS — Z0000 Encounter for general adult medical examination without abnormal findings: Secondary | ICD-10-CM | POA: Diagnosis not present

## 2019-11-02 NOTE — Progress Notes (Signed)
Subjective:   Audrey Hamilton is a 74 y.o. female who presents for Medicare Annual (Subsequent) preventive examination.  Review of Systems    No ROS.  Medicare Wellness Virtual Visit.     Cardiac Risk Factors include: advanced age (>35men, >6 women);hypertension     Objective:    Today's Vitals   11/02/19 1035  Weight: 176 lb (79.8 kg)  Height: 5\' 4"  (1.626 m)   Body mass index is 30.21 kg/m.  Advanced Directives 11/02/2019 10/31/2018 07/04/2018 10/27/2017 07/22/2015  Does Patient Have a Medical Advance Directive? Yes Yes Yes Yes Yes  Type of Paramedic of [place];Living will Healthcare Power of [place];Living will [place];Living will [place];Living will  Does patient want to make changes to medical advance directive? No - Patient declined No - Patient declined - No - Patient declined No - Patient declined  Copy of [place] in Chart? No - copy requested No - copy requested Yes - validated most recent copy scanned in chart (See row information) No - copy requested No - copy requested    Current Medications (verified) Outpatient Encounter Medications as of 11/02/2019  Medication Sig  . amLODipine (NORVASC) 10 MG tablet TAKE 1 TABLET BY MOUTH EVERY DAY  . atorvastatin (LIPITOR) 80 MG tablet TAKE 1 TABLET BY MOUTH EVERY DAY  . Ferrous Fum-Iron Polysacch (TANDEM PO) Take 106 mg by mouth daily.  . fluticasone (FLONASE) 50 MCG/ACT nasal spray Place into the nose at bedtime. Reported on 10/01/2015 (Patient not taking: Reported on 10/12/2019)  . leflunomide (ARAVA) 10 MG tablet 1 tab daily x 90 days  . loratadine (CLARITIN) 10 MG tablet Take 1 tablet (10 mg total) by mouth daily.  Marland Kitchen LORazepam (ATIVAN) 1 MG tablet TAKE 1 TABLET (1 MG TOTAL) BY MOUTH AT BEDTIME.  Marland Kitchen losartan (COZAAR) 50 MG tablet TAKE 1 TABLET BY MOUTH DAILY  . montelukast (SINGULAIR) 10 MG tablet TAKE 1  TABLET BY MOUTH EVERYDAY AT BEDTIME (Patient not taking: Reported on 10/12/2019)  . Omega-3 Fatty Acids (OMEGA-3 FISH OIL) 300 MG CAPS Take by mouth. (Patient not taking: Reported on 10/12/2019)  . omeprazole (PRILOSEC) 20 MG capsule Take by mouth. (Patient not taking: Reported on 10/12/2019)  . potassium chloride SA (K-DUR,KLOR-CON) 20 MEQ tablet Take 1 tablet (20 mEq total) by mouth daily. (Patient not taking: Reported on 10/12/2019)  . traMADol (ULTRAM) 50 MG tablet TAKE 1 TABLET (50 MG TOTAL) BY MOUTH EVERY 8 (EIGHT) HOURS AS NEEDED. (Patient not taking: Reported on 10/12/2019)   No facility-administered encounter medications on file as of 11/02/2019.    Allergies (verified) Amoxicillin-pot clavulanate, Aspirin, Baclofen, Fluconazole, Oxaprozin, and Sulfa antibiotics   History: Past Medical History:  Diagnosis Date  . Allergy   . Anxiety    controlled  . Arthritis   . Chicken pox   . Colon polyps   . Depression   . Fibromyalgia   . History of hypothyroidism   . Hyperlipidemia   . Hypokalemia   . Multiple gastric ulcers   . Rheumatoid arthritis ([place]) Since 1995   currently in a flare up   Past Surgical History:  Procedure Laterality Date  . ABDOMINAL HYSTERECTOMY  1974  . AUGMENTATION MAMMAPLASTY Bilateral 4315'Q   silicone  . COLONOSCOPY WITH PROPOFOL N/A 07/04/2018   Procedure: COLONOSCOPY WITH PROPOFOL;  Surgeon: Manya Silvas, MD;  Location: [facility] ENDOSCOPY;  Service: Endoscopy;  Laterality: N/A;  . EYE  SURGERY    . FOOT SURGERY Left 11/22/10  . MASTECTOMY Bilateral 1970's   SUBCUTANEOUS MASTECTOMY - NO CANCER  . SMALL INTESTINE SURGERY  12/2001  . STOMACH SURGERY  12/2001  . TONSILLECTOMY AND ADENOIDECTOMY  1966  . UPPER GASTROINTESTINAL ENDOSCOPY     Family History  Problem Relation Age of Onset  . Arthritis Mother   . Sudden death Mother        after birth of pt who was 79 days old.  . Depression Mother   . Arthritis Maternal Grandmother   . Cancer Maternal  Grandmother   . Hyperlipidemia Maternal Grandfather   . Diabetes Mellitus II Father   . Liver disease Father   . Rheum arthritis Son   . Stroke Son   . Breast cancer Neg Hx    Social History   Socioeconomic History  . Marital status: Widowed    Spouse name: Not on file  . Number of children: Not on file  . Years of education: Not on file  . Highest education level: Not on file  Occupational History  . Not on file  Tobacco Use  . Smoking status: Former Smoker    Quit date: 06/19/1995    Years since quitting: 24.3  . Smokeless tobacco: Never Used  Vaping Use  . Vaping Use: Never used  Substance and Sexual Activity  . Alcohol use: Yes    Alcohol/week: 0.0 - 1.0 standard drinks    Comment: occ  . Drug use: No  . Sexual activity: Not on file  Other Topics Concern  . Not on file  Social History Narrative  . Not on file   Social Determinants of Health   Financial Resource Strain:   . Difficulty of Paying Living Expenses:   Food Insecurity:   . Worried About Charity fundraiser in the Last Year:   . Arboriculturist in the Last Year:   Transportation Needs:   . Film/video editor (Medical):   Marland Kitchen Lack of Transportation (Non-Medical):   Physical Activity:   . Days of Exercise per Week:   . Minutes of Exercise per Session:   Stress:   . Feeling of Stress :   Social Connections: Unknown  . Frequency of Communication with Friends and Family: More than three times a week  . Frequency of Social Gatherings with Friends and Family: More than three times a week  . Attends Religious Services: Not on file  . Active Member of Clubs or Organizations: Yes  . Attends Archivist Meetings: 1 to 4 times per year  . Marital Status: Widowed    Tobacco Counseling Counseling given: Not Answered   Clinical Intake:  Pre-visit preparation completed: Yes        Diabetes: No  How often do you need to have someone help you when you read instructions, pamphlets, or other  written materials from your doctor or pharmacy?: 1 - Never        Activities of Daily Living In your present state of health, do you have any difficulty performing the following activities: 11/02/2019  Hearing? N  Vision? N  Difficulty concentrating or making decisions? N  Walking or climbing stairs? N  Dressing or bathing? N  Doing errands, shopping? N  Preparing Food and eating ? N  Using the Toilet? N  In the past six months, have you accidently leaked urine? N  Do you have problems with loss of bowel control? N  Managing your Medications?  N  Managing your Finances? N  Housekeeping or managing your Housekeeping? N  Some recent data might be hidden    Patient Care Team: Leone Haven, MD as PCP - General (Family Medicine)  Indicate any recent Medical Services you may have received from other than [facility] providers in the past year (date may be approximate).     Assessment:   This is a routine wellness examination for Audrey Hamilton.  I connected with Audrey Hamilton today by telephone and verified that I am speaking with the correct person using two identifiers. Location patient: home Location provider: work Persons participating in the virtual visit: patient, Marine scientist.    I discussed the limitations, risks, security and privacy concerns of performing an evaluation and management service by telephone and the availability of in person appointments. The patient expressed understanding and verbally consented to this telephonic visit.    Interactive audio and video telecommunications were attempted between this provider and patient, however failed, due to patient having technical difficulties OR patient did not have access to video capability.  We continued and completed visit with audio only.  Some vital signs may be absent or patient reported.   Hearing/Vision screen  Hearing Screening   125Hz  250Hz  500Hz  1000Hz  2000Hz  3000Hz  4000Hz  6000Hz  8000Hz   Right ear:           Left ear:            Comments: Patient is able to hear conversational tones without difficulty.  No issues reported.   Vision Screening Comments: Wears corrective lenses Visual acuity not assessed, virtual visit.  They have seen their ophthalmologist in the last 12 months.     Dietary issues and exercise activities discussed: Current Exercise Habits: Home exercise routine, Type of exercise: walking, Time (Minutes): 15, Frequency (Times/Week): 3, Weekly Exercise (Minutes/Week): 45, Intensity: MildHealthy diet Good water intake  Goals      Patient Stated   .  Increase physical activity (pt-stated)      Walk more for exercise      Depression Screen PHQ 2/9 Scores 11/02/2019 07/31/2019 05/05/2019 10/31/2018 02/21/2018 10/27/2017 07/05/2017  PHQ - 2 Score 0 0 0 0 0 0 0    Fall Risk Fall Risk  11/02/2019 07/31/2019 05/05/2019 10/31/2018 02/21/2018  Falls in the past year? 0 0 0 0 0  Number falls in past yr: 0 0 0 - 0  Injury with Fall? - - - - 0  Follow up Falls evaluation completed Falls evaluation completed Falls evaluation completed - -   Handrails in use while climbing stairs? Yes  Home free of loose throw rugs in walkways, pet beds, electrical cords, etc? Yes  Adequate lighting in your home to reduce risk of falls? Yes   ASSISTIVE DEVICES UTILIZED TO PREVENT FALLS:  Life alert? Yes  Use of a cane, walker or w/c? Yes , walker/cane as needed Grab bars in the bathroom? Yes  Shower chair or bench in shower? Yes  Elevated toilet seat or a handicapped toilet? Yes   TIMED UP AND GO:  Was the test performed? No .   Cognitive Function: MMSE - Mini Mental State Exam 10/27/2017  Orientation to time 5  Orientation to Place 5  Registration 3  Attention/ Calculation 5  Recall 3  Language- name 2 objects 2  Language- repeat 1  Language- follow 3 step command 3  Language- read & follow direction 1  Write a sentence 1  Copy design 1  Total score 30  6CIT Screen 10/31/2018  What Year? 0 points  What  month? 0 points  What time? 0 points  Count back from 20 0 points  Months in reverse 0 points  Repeat phrase 0 points  Total Score 0    Immunizations Immunization History  Administered Date(s) Administered  . Influenza, High Dose Seasonal PF 12/12/2016, 01/17/2018, 12/03/2018, 12/03/2018  . Influenza-Unspecified 12/20/2014, 01/02/2016  . PFIZER SARS-COV-2 Vaccination 05/25/2019, 06/24/2019  . Pneumococcal Polysaccharide-23 12/03/2018  . Tdap 11/02/2018   Health Maintenance There are no preventive care reminders to display for this patient. Health Maintenance  Topic Date Due  . INFLUENZA VACCINE  11/19/2019  . MAMMOGRAM  04/01/2020  . COLONOSCOPY  07/03/2021  . TETANUS/TDAP  11/01/2028  . DEXA SCAN  Completed  . COVID-19 Vaccine  Completed  . Hepatitis C Screening  Completed  . PNA vac Low Risk Adult  Addressed   Dental Screening: Recommended annual dental exams for proper oral hygiene. Dentures.   Community Resource Referral / Chronic Care Management: CRR required this visit?  No   CCM required this visit?  No     Plan:   Keep all routine maintenance appointments.   Follow up 12/21/19 @ 10:00  I have personally reviewed and noted the following in the patient's chart:   . Medical and social history . Use of alcohol, tobacco or illicit drugs  . Current medications and supplements . Functional ability and status . Nutritional status . Physical activity . Advanced directives . List of other physicians . Hospitalizations, surgeries, and ER visits in previous 12 months . Vitals . Screenings to include cognitive, depression, and falls . Referrals and appointments  In addition, I have reviewed and discussed with patient certain preventive protocols, quality metrics, and best practice recommendations. A written personalized care plan for preventive services as well as general preventive health recommendations were provided to patient via mychart.     Varney Biles, LPN   [id number]

## 2019-11-02 NOTE — Patient Instructions (Addendum)
Audrey Hamilton , Thank you for taking time to come for your Medicare Wellness Visit. I appreciate your ongoing commitment to your health goals. Please review the following plan we discussed and let me know if I can assist you in the future.   These are the goals we discussed: Goals      Patient Stated     Increase physical activity (pt-stated)      Walk more for exercise       This is a list of the screening recommended for you and due dates:  Health Maintenance  Topic Date Due   Flu Shot  11/19/2019   Mammogram  04/01/2020   Colon Cancer Screening  07/03/2021   Tetanus Vaccine  11/01/2028   DEXA scan (bone density measurement)  Completed   COVID-19 Vaccine  Completed    Hepatitis C: One time screening is recommended by Center for Disease Control  (CDC) for  adults born from [birth year] through 1964.   Completed   Pneumonia vaccines  Addressed    Immunizations Immunization History  Administered Date(s) Administered   Influenza, High Dose Seasonal PF 12/12/2016, 01/17/2018, 12/03/2018, 12/03/2018   Influenza-Unspecified 12/20/2014, 01/02/2016   PFIZER SARS-COV-2 Vaccination 05/25/2019, 06/24/2019   Pneumococcal Polysaccharide-23 12/03/2018   Tdap 11/02/2018   Keep all routine maintenance appointments.   Follow up 12/21/19 @ 10:00  Advanced directives: End of life planning; Advance aging; Advanced directives discussed.  Copy of current HCPOA/Living Will requested.    Conditions/risks identified: none new  Follow up in one year for your annual wellness visit.   Preventive Care 16 Years and Older, Female Preventive care refers to lifestyle choices and visits with your health care provider that can promote health and wellness. What does preventive care include?  A yearly physical exam. This is also called an annual well check.  Dental exams once or twice a year.  Routine eye exams. Ask your health care provider how often you should have your eyes  checked.  Personal lifestyle choices, including:  Daily care of your teeth and gums.  Regular physical activity.  Eating a healthy diet.  Avoiding tobacco and drug use.  Limiting alcohol use.  Practicing safe sex.  Taking low-dose aspirin every day.  Taking vitamin and mineral supplements as recommended by your health care provider. What happens during an annual well check? The services and screenings done by your health care provider during your annual well check will depend on your age, overall health, lifestyle risk factors, and family history of disease. Counseling  Your health care provider may ask you questions about your:  Alcohol use.  Tobacco use.  Drug use.  Emotional well-being.  Home and relationship well-being.  Sexual activity.  Eating habits.  History of falls.  Memory and ability to understand (cognition).  Work and work Statistician.  Reproductive health. Screening  You may have the following tests or measurements:  Height, weight, and BMI.  Blood pressure.  Lipid and cholesterol levels. These may be checked every 5 years, or more frequently if you are over 2 years old.  Skin check.  Lung cancer screening. You may have this screening every year starting at age 45 if you have a 30-pack-year history of smoking and currently smoke or have quit within the past 15 years.  Fecal occult blood test (FOBT) of the stool. You may have this test every year starting at age 38.  Flexible sigmoidoscopy or colonoscopy. You may have a sigmoidoscopy every 5 years or a colonoscopy every  10 years starting at age 19.  Hepatitis C blood test.  Hepatitis B blood test.  Sexually transmitted disease (STD) testing.  Diabetes screening. This is done by checking your blood sugar (glucose) after you have not eaten for a while (fasting). You may have this done every 1-3 years.  Bone density scan. This is done to screen for osteoporosis. You may have this done  starting at age 60.  Mammogram. This may be done every 1-2 years. Talk to your health care provider about how often you should have regular mammograms. Talk with your health care provider about your test results, treatment options, and if necessary, the need for more tests. Vaccines  Your health care provider may recommend certain vaccines, such as:  Influenza vaccine. This is recommended every year.  Tetanus, diphtheria, and acellular pertussis (Tdap, Td) vaccine. You may need a Td booster every 10 years.  Zoster vaccine. You may need this after age 76.  Pneumococcal 13-valent conjugate (PCV13) vaccine. One dose is recommended after age 65.  Pneumococcal polysaccharide (PPSV23) vaccine. One dose is recommended after age 2. Talk to your health care provider about which screenings and vaccines you need and how often you need them. This information is not intended to replace advice given to you by your health care provider. Make sure you discuss any questions you have with your health care provider. Document Released: 05/03/2015 Document Revised: 12/25/2015 Document Reviewed: 02/05/2015 Elsevier Interactive Patient Education  2017 [place] Prevention in the Home Falls can cause injuries. They can happen to people of all ages. There are many things you can do to make your home safe and to help prevent falls. What can I do on the outside of my home?  Regularly fix the edges of walkways and driveways and fix any cracks.  Remove anything that might make you trip as you walk through a door, such as a raised step or threshold.  Trim any bushes or trees on the path to your home.  Use bright outdoor lighting.  Clear any walking paths of anything that might make someone trip, such as rocks or tools.  Regularly check to see if handrails are loose or broken. Make sure that both sides of any steps have handrails.  Any raised decks and porches should have guardrails on the  edges.  Have any leaves, snow, or ice cleared regularly.  Use sand or salt on walking paths during winter.  Clean up any spills in your garage right away. This includes oil or grease spills. What can I do in the bathroom?  Use night lights.  Install grab bars by the toilet and in the tub and shower. Do not use towel bars as grab bars.  Use non-skid mats or decals in the tub or shower.  If you need to sit down in the shower, use a plastic, non-slip stool.  Keep the floor dry. Clean up any water that spills on the floor as soon as it happens.  Remove soap buildup in the tub or shower regularly.  Attach bath mats securely with double-sided non-slip rug tape.  Do not have throw rugs and other things on the floor that can make you trip. What can I do in the bedroom?  Use night lights.  Make sure that you have a light by your bed that is easy to reach.  Do not use any sheets or blankets that are too big for your bed. They should not hang down onto the floor.  Have a firm chair that has side arms. You can use this for support while you get dressed.  Do not have throw rugs and other things on the floor that can make you trip. What can I do in the kitchen?  Clean up any spills right away.  Avoid walking on wet floors.  Keep items that you use a lot in easy-to-reach places.  If you need to reach something above you, use a strong step stool that has a grab bar.  Keep electrical cords out of the way.  Do not use floor polish or wax that makes floors slippery. If you must use wax, use non-skid floor wax.  Do not have throw rugs and other things on the floor that can make you trip. What can I do with my stairs?  Do not leave any items on the stairs.  Make sure that there are handrails on both sides of the stairs and use them. Fix handrails that are broken or loose. Make sure that handrails are as long as the stairways.  Check any carpeting to make sure that it is firmly  attached to the stairs. Fix any carpet that is loose or worn.  Avoid having throw rugs at the top or bottom of the stairs. If you do have throw rugs, attach them to the floor with carpet tape.  Make sure that you have a light switch at the top of the stairs and the bottom of the stairs. If you do not have them, ask someone to add them for you. What else can I do to help prevent falls?  Wear shoes that:  Do not have high heels.  Have rubber bottoms.  Are comfortable and fit you well.  Are closed at the toe. Do not wear sandals.  If you use a stepladder:  Make sure that it is fully opened. Do not climb a closed stepladder.  Make sure that both sides of the stepladder are locked into place.  Ask someone to hold it for you, if possible.  Clearly mark and make sure that you can see:  Any grab bars or handrails.  First and last steps.  Where the edge of each step is.  Use tools that help you move around (mobility aids) if they are needed. These include:  Canes.  Walkers.  Scooters.  Crutches.  Turn on the lights when you go into a dark area. Replace any light bulbs as soon as they burn out.  Set up your furniture so you have a clear path. Avoid moving your furniture around.  If any of your floors are uneven, fix them.  If there are any pets around you, be aware of where they are.  Review your medicines with your doctor. Some medicines can make you feel dizzy. This can increase your chance of falling. Ask your doctor what other things that you can do to help prevent falls. This information is not intended to replace advice given to you by your health care provider. Make sure you discuss any questions you have with your health care provider. Document Released: 01/31/2009 Document Revised: 09/12/2015 Document Reviewed: 05/11/2014 Elsevier Interactive Patient Education  2017 [employer].

## 2019-11-17 DIAGNOSIS — M5136 Other intervertebral disc degeneration, lumbar region: Secondary | ICD-10-CM | POA: Diagnosis not present

## 2019-11-17 DIAGNOSIS — M9903 Segmental and somatic dysfunction of lumbar region: Secondary | ICD-10-CM | POA: Diagnosis not present

## 2019-11-17 DIAGNOSIS — M9901 Segmental and somatic dysfunction of cervical region: Secondary | ICD-10-CM | POA: Diagnosis not present

## 2019-11-17 DIAGNOSIS — M5033 Other cervical disc degeneration, cervicothoracic region: Secondary | ICD-10-CM | POA: Diagnosis not present

## 2019-11-20 ENCOUNTER — Other Ambulatory Visit: Payer: Self-pay | Admitting: Family Medicine

## 2019-11-29 DIAGNOSIS — M5136 Other intervertebral disc degeneration, lumbar region: Secondary | ICD-10-CM | POA: Diagnosis not present

## 2019-11-29 DIAGNOSIS — M9903 Segmental and somatic dysfunction of lumbar region: Secondary | ICD-10-CM | POA: Diagnosis not present

## 2019-11-29 DIAGNOSIS — M9901 Segmental and somatic dysfunction of cervical region: Secondary | ICD-10-CM | POA: Diagnosis not present

## 2019-11-29 DIAGNOSIS — M5033 Other cervical disc degeneration, cervicothoracic region: Secondary | ICD-10-CM | POA: Diagnosis not present

## 2019-12-03 ENCOUNTER — Other Ambulatory Visit: Payer: Self-pay | Admitting: Family Medicine

## 2019-12-13 ENCOUNTER — Ambulatory Visit (INDEPENDENT_AMBULATORY_CARE_PROVIDER_SITE_OTHER): Payer: Medicare Other | Admitting: Dermatology

## 2019-12-13 ENCOUNTER — Other Ambulatory Visit: Payer: Self-pay

## 2019-12-13 DIAGNOSIS — L821 Other seborrheic keratosis: Secondary | ICD-10-CM

## 2019-12-13 DIAGNOSIS — L820 Inflamed seborrheic keratosis: Secondary | ICD-10-CM | POA: Diagnosis not present

## 2019-12-13 NOTE — Progress Notes (Signed)
   Follow-Up Visit   Subjective  Audrey Hamilton is a 74 y.o. female who presents for the following: recheck ISK (R sup forehead, 6m f/u, LN2).  The following portions of the chart were reviewed this encounter and updated as appropriate:  Tobacco  Allergies  Meds  Problems  Med Hx  Surg Hx  Fam Hx     Review of Systems:  No other skin or systemic complaints except as noted in HPI or Assessment and Plan.  Objective  Well appearing patient in no apparent distress; mood and affect are within normal limits.  A focused examination was performed including face. Relevant physical exam findings are noted in the Assessment and Plan.  Objective  R superior forehead: Clear today   Assessment & Plan    Seborrheic Keratoses - Stuck-on, waxy, tan-brown papules and plaques  - Discussed benign etiology and prognosis. - Observe - Call for any changes  Inflamed seborrheic keratosis R superior forehead  Hx of ISK clear with Ln2 txt, observe  Return if symptoms worsen or fail to improve.  I, Othelia Pulling, RMA, am acting as scribe for Sarina Ser, MD .Documentation: I have reviewed the above documentation for accuracy and completeness, and I agree with the above.  Sarina Ser, MD

## 2019-12-20 ENCOUNTER — Encounter: Payer: Self-pay | Admitting: Dermatology

## 2019-12-27 DIAGNOSIS — Z23 Encounter for immunization: Secondary | ICD-10-CM | POA: Diagnosis not present

## 2020-01-02 ENCOUNTER — Other Ambulatory Visit: Payer: Self-pay | Admitting: Family Medicine

## 2020-01-03 DIAGNOSIS — M9903 Segmental and somatic dysfunction of lumbar region: Secondary | ICD-10-CM | POA: Diagnosis not present

## 2020-01-03 DIAGNOSIS — M9905 Segmental and somatic dysfunction of pelvic region: Secondary | ICD-10-CM | POA: Diagnosis not present

## 2020-01-03 DIAGNOSIS — M62830 Muscle spasm of back: Secondary | ICD-10-CM | POA: Diagnosis not present

## 2020-01-03 DIAGNOSIS — M5136 Other intervertebral disc degeneration, lumbar region: Secondary | ICD-10-CM | POA: Diagnosis not present

## 2020-01-09 ENCOUNTER — Other Ambulatory Visit: Payer: Self-pay

## 2020-01-09 ENCOUNTER — Telehealth (INDEPENDENT_AMBULATORY_CARE_PROVIDER_SITE_OTHER): Payer: Medicare Other | Admitting: Family Medicine

## 2020-01-09 ENCOUNTER — Encounter: Payer: Self-pay | Admitting: Family Medicine

## 2020-01-09 DIAGNOSIS — M069 Rheumatoid arthritis, unspecified: Secondary | ICD-10-CM | POA: Diagnosis not present

## 2020-01-09 DIAGNOSIS — R7303 Prediabetes: Secondary | ICD-10-CM

## 2020-01-09 DIAGNOSIS — L989 Disorder of the skin and subcutaneous tissue, unspecified: Secondary | ICD-10-CM | POA: Diagnosis not present

## 2020-01-09 DIAGNOSIS — G4700 Insomnia, unspecified: Secondary | ICD-10-CM

## 2020-01-09 DIAGNOSIS — I10 Essential (primary) hypertension: Secondary | ICD-10-CM | POA: Diagnosis not present

## 2020-01-09 NOTE — Assessment & Plan Note (Signed)
Found to be a seborrheic keratosis.  She will monitor the area.

## 2020-01-09 NOTE — Assessment & Plan Note (Signed)
Stable.  She can continue tramadol 50 mg 3 times daily as needed for pain.  She will let us know when she needs a refill.  She will continue to see rheumatology.

## 2020-01-09 NOTE — Progress Notes (Signed)
Virtual Visit via video Note  This visit type was conducted due to national recommendations for restrictions regarding the COVID-19 pandemic (e.g. social distancing).  This format is felt to be most appropriate for this patient at this time.  All issues noted in this document were discussed and addressed.  No physical exam was performed (except for noted visual exam findings with Video Visits).   I connected with Audrey Hamilton today at 10:00 AM EDT by a video enabled telemedicine application or telephone and verified that I am speaking with the correct person using two identifiers. Location patient: home Location provider: work  Persons participating in the virtual visit: patient, provider  I discussed the limitations, risks, security and privacy concerns of performing an evaluation and management service by telephone and the availability of in person appointments. I also discussed with the patient that there may be a patient responsible charge related to this service. The patient expressed understanding and agreed to proceed.  Reason for visit: f/u.  HPI: HYPERTENSION  Disease Monitoring  Home BP Monitoring 128/68 Chest pain- no    Dyspnea- no Medications  Compliance-  Taking amlodipine, losartan.   Edema- no  Insomnia: Chronic issues with this.  Has been on Ativan for at least 7 years.  Notes it is beneficial.  She gets 6 to 7 hours of sleep with this.  No drowsiness.  Rheumatoid arthritis: Is on Arava through rheumatology.  I prescribe her tramadol to take to help with her discomfort.  She notes she takes a tramadol twice daily does help take the edge off.  Seborrheic keratosis: Had this removed from her forehead by dermatology.  She can hardly tell that it was there.     ROS: See pertinent positives and negatives per HPI.  Past Medical History:  Diagnosis Date  . Allergy   . Anxiety    controlled  . Arthritis   . Chicken pox   . Colon polyps   . Depression   . Fibromyalgia     . History of hypothyroidism   . Hyperlipidemia   . Hypokalemia   . Multiple gastric ulcers   . Rheumatoid arthritis ([place]) Since 1995   currently in a flare up    Past Surgical History:  Procedure Laterality Date  . ABDOMINAL HYSTERECTOMY  1974  . AUGMENTATION MAMMAPLASTY Bilateral 3545'G   silicone  . COLONOSCOPY WITH PROPOFOL N/A 07/04/2018   Procedure: COLONOSCOPY WITH PROPOFOL;  Surgeon: Manya Silvas, MD;  Location: [facility] END[facility]COPY;  Service: Endoscopy;  Laterality: N/A;  . EYE SURGERY    . FOOT SURGERY Left 11/22/10  . MASTECTOMY Bilateral 1970's   SUBCUTANEOUS MASTECTOMY - NO CANCER  . SMALL INTESTINE SURGERY  12/2001  . STOMACH SURGERY  12/2001  . TONSILLECTOMY AND ADENOIDECTOMY  1966  . UPPER GASTROINTESTINAL ENDOSCOPY      Family History  Problem Relation Age of Onset  . Arthritis Mother   . Sudden death Mother        after birth of pt who was 59 days old.  . Depression Mother   . Arthritis Maternal Grandmother   . Cancer Maternal Grandmother   . Hyperlipidemia Maternal Grandfather   . Diabetes Mellitus II Father   . Liver disease Father   . Rheum arthritis Son   . Stroke Son   . Breast cancer Neg Hx     SOCIAL HX: Former smoker   Current Outpatient Medications:  .  amLODipine (NORVASC) 10 MG tablet, TAKE 1 TABLET BY MOUTH EVERY  DAY, Disp: 90 tablet, Rfl: 1 .  atorvastatin (LIPITOR) 80 MG tablet, TAKE 1 TABLET BY MOUTH EVERY DAY, Disp: 90 tablet, Rfl: 1 .  Ferrous Fum-Iron Polysacch (TANDEM PO), Take 106 mg by mouth daily., Disp: , Rfl:  .  fluticasone (FLONASE) 50 MCG/ACT nasal spray, Place into the nose at bedtime. Reported on 10/01/2015, Disp: , Rfl:  .  leflunomide (ARAVA) 10 MG tablet, 1 tab daily x 90 days, Disp: , Rfl:  .  loratadine (CLARITIN) 10 MG tablet, Take 1 tablet (10 mg total) by mouth daily., Disp: 30 tablet, Rfl: 11 .  LORazepam (ATIVAN) 1 MG tablet, TAKE 1 TABLET BY MOUTH AT BEDTIME., Disp: 30 tablet, Rfl: 1 .  losartan (COZAAR) 50 MG  tablet, TAKE 1 TABLET BY MOUTH DAILY, Disp: 90 tablet, Rfl: 1 .  montelukast (SINGULAIR) 10 MG tablet, TAKE 1 TABLET BY MOUTH EVERYDAY AT BEDTIME, Disp: 90 tablet, Rfl: 0 .  Omega-3 Fatty Acids (OMEGA-3 FISH OIL) 300 MG CAPS, Take by mouth. , Disp: , Rfl:  .  omeprazole (PRILOSEC) 20 MG capsule, Take by mouth. , Disp: , Rfl:  .  potassium chloride SA (K-DUR,KLOR-CON) 20 MEQ tablet, Take 1 tablet (20 mEq total) by mouth daily., Disp: 6 tablet, Rfl: 0 .  traMADol (ULTRAM) 50 MG tablet, TAKE 1 TABLET (50 MG TOTAL) BY MOUTH EVERY 8 (EIGHT) HOURS AS NEEDED., Disp: 90 tablet, Rfl: 0  EXAM:  VITALS per patient if applicable:  GENERAL: alert, oriented, appears well and in no acute distress  HEENT: atraumatic, conjunttiva clear, no obvious abnormalities on inspection of external nose and ears  NECK: normal movements of the head and neck  LUNGS: on inspection no signs of respiratory distress, breathing rate appears normal, no obvious gross SOB, gasping or wheezing  CV: no obvious cyanosis  MS: moves all visible extremities without noticeable abnormality  PSYCH/NEURO: pleasant and cooperative, no obvious depression or anxiety, speech and thought processing grossly intact  ASSESSMENT AND PLAN:  Discussed the following assessment and plan:  Essential hypertension At goal.  Continue amlodipine 10 mg once daily and losartan 50 mg once daily.  She will come in for labs as outlined below.  Rheumatoid arthritis ([place]) Stable.  She can continue tramadol 50 mg 3 times daily as needed for pain.  She will let us know when she needs a refill.  She will continue to see rheumatology.  Insomnia Stable on lorazepam.  Discussed risk of addiction and dependence with this medicine as well as risk of dementia with long-term use.  She will continue this for now though I did discuss we may consider trying to change her to something different in the future.  Skin lesion Found to be a seborrheic keratosis.  She  will monitor the area.  Prediabetes Check A1c.   Orders Placed This Encounter  Procedures  . Comp Met (CMET)    Standing Status:   Future    Standing Expiration Date:   01/08/2021  . Lipid panel    Standing Status:   Future    Standing Expiration Date:   01/08/2021  . HgB A1c    Standing Status:   Future    Standing Expiration Date:   01/08/2021    No orders of the defined types were placed in this encounter.    I discussed the assessment and treatment plan with the patient. The patient was provided an opportunity to ask questions and all were answered. The patient agreed with the plan and demonstrated an  understanding of the instructions.   The patient was advised to call back or seek an in-person evaluation if the symptoms worsen or if the condition fails to improve as anticipated.     Tommi Rumps, MD

## 2020-01-09 NOTE — Assessment & Plan Note (Signed)
Check A1c. 

## 2020-01-09 NOTE — Assessment & Plan Note (Signed)
Stable on lorazepam.  Discussed risk of addiction and dependence with this medicine as well as risk of dementia with long-term use.  She will continue this for now though I did discuss we may consider trying to change her to something different in the future.

## 2020-01-09 NOTE — Assessment & Plan Note (Signed)
At goal.  Continue amlodipine 10 mg once daily and losartan 50 mg once daily.  She will come in for labs as outlined below.

## 2020-01-19 ENCOUNTER — Other Ambulatory Visit: Payer: Self-pay | Admitting: Family Medicine

## 2020-01-19 DIAGNOSIS — E782 Mixed hyperlipidemia: Secondary | ICD-10-CM

## 2020-01-28 ENCOUNTER — Other Ambulatory Visit: Payer: Self-pay | Admitting: Family Medicine

## 2020-01-29 ENCOUNTER — Other Ambulatory Visit: Payer: Self-pay | Admitting: Family Medicine

## 2020-01-31 DIAGNOSIS — M62830 Muscle spasm of back: Secondary | ICD-10-CM | POA: Diagnosis not present

## 2020-01-31 DIAGNOSIS — M5136 Other intervertebral disc degeneration, lumbar region: Secondary | ICD-10-CM | POA: Diagnosis not present

## 2020-01-31 DIAGNOSIS — M9903 Segmental and somatic dysfunction of lumbar region: Secondary | ICD-10-CM | POA: Diagnosis not present

## 2020-01-31 DIAGNOSIS — M9905 Segmental and somatic dysfunction of pelvic region: Secondary | ICD-10-CM | POA: Diagnosis not present

## 2020-02-12 ENCOUNTER — Other Ambulatory Visit: Payer: Self-pay | Admitting: Family Medicine

## 2020-02-26 ENCOUNTER — Ambulatory Visit (INDEPENDENT_AMBULATORY_CARE_PROVIDER_SITE_OTHER): Payer: Medicare Other | Admitting: Family Medicine

## 2020-02-26 ENCOUNTER — Other Ambulatory Visit: Payer: Self-pay

## 2020-02-26 DIAGNOSIS — Z23 Encounter for immunization: Secondary | ICD-10-CM

## 2020-02-26 DIAGNOSIS — M7918 Myalgia, other site: Secondary | ICD-10-CM | POA: Diagnosis not present

## 2020-02-26 DIAGNOSIS — M170 Bilateral primary osteoarthritis of knee: Secondary | ICD-10-CM | POA: Diagnosis not present

## 2020-02-26 DIAGNOSIS — Z79899 Other long term (current) drug therapy: Secondary | ICD-10-CM | POA: Diagnosis not present

## 2020-02-26 DIAGNOSIS — M0609 Rheumatoid arthritis without rheumatoid factor, multiple sites: Secondary | ICD-10-CM | POA: Diagnosis not present

## 2020-02-28 DIAGNOSIS — M9903 Segmental and somatic dysfunction of lumbar region: Secondary | ICD-10-CM | POA: Diagnosis not present

## 2020-02-28 DIAGNOSIS — M62830 Muscle spasm of back: Secondary | ICD-10-CM | POA: Diagnosis not present

## 2020-02-28 DIAGNOSIS — M5136 Other intervertebral disc degeneration, lumbar region: Secondary | ICD-10-CM | POA: Diagnosis not present

## 2020-02-28 DIAGNOSIS — M9905 Segmental and somatic dysfunction of pelvic region: Secondary | ICD-10-CM | POA: Diagnosis not present

## 2020-03-02 ENCOUNTER — Other Ambulatory Visit: Payer: Self-pay | Admitting: Family Medicine

## 2020-03-02 DIAGNOSIS — I10 Essential (primary) hypertension: Secondary | ICD-10-CM

## 2020-03-25 ENCOUNTER — Other Ambulatory Visit: Payer: Self-pay | Admitting: Family Medicine

## 2020-03-27 ENCOUNTER — Other Ambulatory Visit: Payer: Self-pay | Admitting: Family Medicine

## 2020-04-03 ENCOUNTER — Other Ambulatory Visit: Payer: Self-pay

## 2020-04-03 ENCOUNTER — Ambulatory Visit (INDEPENDENT_AMBULATORY_CARE_PROVIDER_SITE_OTHER): Payer: Medicare Other | Admitting: Dermatology

## 2020-04-03 DIAGNOSIS — L821 Other seborrheic keratosis: Secondary | ICD-10-CM | POA: Diagnosis not present

## 2020-04-03 DIAGNOSIS — L820 Inflamed seborrheic keratosis: Secondary | ICD-10-CM

## 2020-04-03 DIAGNOSIS — L578 Other skin changes due to chronic exposure to nonionizing radiation: Secondary | ICD-10-CM | POA: Diagnosis not present

## 2020-04-03 NOTE — Progress Notes (Signed)
   Follow-Up Visit   Subjective  Audrey Hamilton is a 74 y.o. female who presents for the following: lesions (Scattered on the face - patient has noticed that some of them are growing and changing, and she would like them treated).  The following portions of the chart were reviewed this encounter and updated as appropriate:   Tobacco  Allergies  Meds  Problems  Med Hx  Surg Hx  Fam Hx     Review of Systems:  No other skin or systemic complaints except as noted in HPI or Assessment and Plan.  Objective  Well appearing patient in no apparent distress; mood and affect are within normal limits.  A focused examination was performed including the face. Relevant physical exam findings are noted in the Assessment and Plan.  Objective  face x 18, L neck x 1 (19): Erythematous keratotic or waxy stuck-on papule or plaque.   Assessment & Plan  Inflamed seborrheic keratosis (19) face x 18, L neck x 1  Destruction of lesion - face x 18, L neck x 1 Complexity: simple   Destruction method: cryotherapy   Informed consent: discussed and consent obtained   Timeout:  patient name, date of birth, surgical site, and procedure verified Lesion destroyed using liquid nitrogen: Yes   Region frozen until ice ball extended beyond lesion: Yes   Outcome: patient tolerated procedure well with no complications   Post-procedure details: wound care instructions given     Actinic Damage - chronic, secondary to cumulative UV radiation exposure/sun exposure over time - diffuse scaly erythematous macules with underlying dyspigmentation - Recommend daily broad spectrum sunscreen SPF 30+ to sun-exposed areas, reapply every 2 hours as needed.  - Call for new or changing lesions.  Seborrheic Keratoses - Stuck-on, waxy, tan-brown papules and plaques  - Discussed benign etiology and prognosis. - Observe - Call for any changes  Return if symptoms worsen or fail to improve.  Luther Redo, CMA, am  acting as scribe for Sarina Ser, MD .  Documentation: I have reviewed the above documentation for accuracy and completeness, and I agree with the above.  Sarina Ser, MD

## 2020-04-09 ENCOUNTER — Encounter: Payer: Self-pay | Admitting: Dermatology

## 2020-05-06 ENCOUNTER — Other Ambulatory Visit: Payer: Self-pay | Admitting: Family Medicine

## 2020-05-08 DIAGNOSIS — M9905 Segmental and somatic dysfunction of pelvic region: Secondary | ICD-10-CM | POA: Diagnosis not present

## 2020-05-08 DIAGNOSIS — M9903 Segmental and somatic dysfunction of lumbar region: Secondary | ICD-10-CM | POA: Diagnosis not present

## 2020-05-08 DIAGNOSIS — M62830 Muscle spasm of back: Secondary | ICD-10-CM | POA: Diagnosis not present

## 2020-05-08 DIAGNOSIS — M5136 Other intervertebral disc degeneration, lumbar region: Secondary | ICD-10-CM | POA: Diagnosis not present

## 2020-05-09 ENCOUNTER — Other Ambulatory Visit: Payer: Self-pay

## 2020-05-13 ENCOUNTER — Other Ambulatory Visit: Payer: Self-pay

## 2020-05-13 ENCOUNTER — Ambulatory Visit (INDEPENDENT_AMBULATORY_CARE_PROVIDER_SITE_OTHER): Payer: Medicare Other | Admitting: Family Medicine

## 2020-05-13 ENCOUNTER — Encounter: Payer: Self-pay | Admitting: Family Medicine

## 2020-05-13 VITALS — BP 167/94 | HR 100 | Temp 97.7°F | Ht 64.02 in | Wt 173.4 lb

## 2020-05-13 DIAGNOSIS — Z1231 Encounter for screening mammogram for malignant neoplasm of breast: Secondary | ICD-10-CM

## 2020-05-13 DIAGNOSIS — I10 Essential (primary) hypertension: Secondary | ICD-10-CM | POA: Diagnosis not present

## 2020-05-13 DIAGNOSIS — G4700 Insomnia, unspecified: Secondary | ICD-10-CM | POA: Diagnosis not present

## 2020-05-13 DIAGNOSIS — R7303 Prediabetes: Secondary | ICD-10-CM | POA: Diagnosis not present

## 2020-05-13 DIAGNOSIS — M069 Rheumatoid arthritis, unspecified: Secondary | ICD-10-CM

## 2020-05-13 LAB — CBC
HCT: 37.3 % (ref 36.0–46.0)
Hemoglobin: 12.5 g/dL (ref 12.0–15.0)
MCHC: 33.4 g/dL (ref 30.0–36.0)
MCV: 89.5 fl (ref 78.0–100.0)
Platelets: 351 10*3/uL (ref 150.0–400.0)
RBC: 4.17 Mil/uL (ref 3.87–5.11)
RDW: 16 % — ABNORMAL HIGH (ref 11.5–15.5)
WBC: 8 10*3/uL (ref 4.0–10.5)

## 2020-05-13 LAB — COMPREHENSIVE METABOLIC PANEL
ALT: 11 U/L (ref 0–35)
AST: 12 U/L (ref 0–37)
Albumin: 4.1 g/dL (ref 3.5–5.2)
Alkaline Phosphatase: 67 U/L (ref 39–117)
BUN: 10 mg/dL (ref 6–23)
CO2: 26 mEq/L (ref 19–32)
Calcium: 9.2 mg/dL (ref 8.4–10.5)
Chloride: 105 mEq/L (ref 96–112)
Creatinine, Ser: 0.56 mg/dL (ref 0.40–1.20)
GFR: 90.2 mL/min (ref >60.00)
Glucose, Bld: 107 mg/dL — ABNORMAL HIGH (ref 70–99)
Potassium: 4 mEq/L (ref 3.5–5.1)
Sodium: 140 mEq/L (ref 135–145)
Total Bilirubin: 0.2 mg/dL (ref 0.2–1.2)
Total Protein: 6.6 g/dL (ref 6.0–8.3)

## 2020-05-13 LAB — LIPID PANEL
Cholesterol: 173 mg/dL (ref 0–200)
HDL: 41.5 mg/dL (ref >39.00)
NonHDL: 131.49
Total CHOL/HDL Ratio: 4
Triglycerides: 381 mg/dL — ABNORMAL HIGH (ref 0.0–149.0)
VLDL: 76.2 mg/dL — ABNORMAL HIGH (ref 0.0–40.0)

## 2020-05-13 LAB — HEMOGLOBIN A1C: Hgb A1c MFr Bld: 5.8 % (ref 4.6–6.5)

## 2020-05-13 LAB — LDL CHOLESTEROL, DIRECT: Direct LDL: 82 mg/dL

## 2020-05-13 LAB — TSH: TSH: 4.86 u[IU]/mL — ABNORMAL HIGH (ref 0.35–4.50)

## 2020-05-13 NOTE — Patient Instructions (Signed)
Nice to see you. We are going to bring you back later this week for a nurse visit to recheck your blood pressure.  Please bring your blood pressure cuff with you. Please call [phone] to schedule your mammogram. We will call you with your lab results.

## 2020-05-13 NOTE — Assessment & Plan Note (Signed)
Chronic issue.  Stable on lorazepam 1 mg nightly.  She can continue that medication at this time.  Discussed risk of dependence and addiction with this type of medication.

## 2020-05-13 NOTE — Assessment & Plan Note (Signed)
Chronic ongoing issue.  Tramadol is helpful.  She can continue tramadol 50 mg twice daily as needed for pain.  She will continue to see rheumatology.

## 2020-05-13 NOTE — Assessment & Plan Note (Signed)
Check A1c. 

## 2020-05-13 NOTE — Assessment & Plan Note (Signed)
Above goal in the office today though adequately controlled at home.  We will have her bring her blood pressure cuff in for nurse visit.  She will continue amlodipine 10 mg once daily and losartan 50 mg once daily.  Check labs.

## 2020-05-13 NOTE — Progress Notes (Signed)
Tommi Rumps, MD Phone: [phone]  Audrey Hamilton is a 75 y.o. female who presents today for f/u.  HYPERTENSION  Disease Monitoring  Home BP Monitoring 134/70s at home Chest pain- no    Dyspnea- no Medications  Compliance-  Taking amlodipine, losartan.   Edema- no  Insomnia: Chronic issue.  Takes Ativan nightly with good benefit.  Gets 5 hours of sleep with this.  No drowsiness.  No alcohol intake.  She did previously take Ambien many years ago and had significant sleepwalking issues with this.  Rheumatoid arthritis: Patient has chronic joint pain from this.  She does see rheumatology.  She takes tramadol 2 times daily with good benefit.  No drowsiness with this medication.    Social History   Tobacco Use  Smoking Status Former Smoker  . Quit date: 06/19/1995  . Years since quitting: 24.9  Smokeless Tobacco Never Used    Current Outpatient Medications on File Prior to Visit  Medication Sig Dispense Refill  . amLODipine (NORVASC) 10 MG tablet TAKE 1 TABLET BY MOUTH EVERY DAY 90 tablet 1  . atorvastatin (LIPITOR) 80 MG tablet TAKE 1 TABLET BY MOUTH EVERY DAY 90 tablet 1  . Ferrous Fum-Iron Polysacch (TANDEM PO) Take 106 mg by mouth daily.    . fluticasone (FLONASE) 50 MCG/ACT nasal spray Place into the nose at bedtime. Reported on 10/01/2015    . leflunomide (ARAVA) 10 MG tablet 1 tab daily x 90 days    . loratadine (CLARITIN) 10 MG tablet Take 1 tablet (10 mg total) by mouth daily. 30 tablet 11  . LORazepam (ATIVAN) 1 MG tablet TAKE 1 TABLET BY MOUTH EVERYDAY AT BEDTIME 30 tablet 1  . losartan (COZAAR) 50 MG tablet TAKE 1 TABLET BY MOUTH DAILY 90 tablet 1  . montelukast (SINGULAIR) 10 MG tablet TAKE 1 TABLET BY MOUTH EVERYDAY AT BEDTIME 90 tablet 0  . Omega-3 Fatty Acids (OMEGA-3 FISH OIL) 300 MG CAPS Take by mouth.     Mar[place] Kitchen omeprazole (PRILOSEC) 20 MG capsule Take by mouth.     . potassium chloride SA (K-DUR,KLOR-CON) 20 MEQ tablet Take 1 tablet (20 mEq total) by mouth  daily. 6 tablet 0  . traMADol (ULTRAM) 50 MG tablet TAKE 1 TABLET BY MOUTH EVERY 8 HOURS AS NEEDED 90 tablet 0   No current facility-administered medications on file prior to visit.     ROS see history of present illness  Objective  Physical Exam Vitals:   05/13/20 0933 05/13/20 0957  BP: (!) 178/98 (!) 167/94  Pulse: 100   Temp: 97.7 F (36.5 C)   SpO2: 97%     BP Readings from Last 3 Encounters:  05/13/20 (!) 167/94  09/05/19 130/79  07/31/19 120/80   Wt Readings from Last 3 Encounters:  05/13/20 173 lb 6.4 oz (78.7 kg)  01/09/20 179 lb (81.2 kg)  11/02/19 176 lb (79.8 kg)    Physical Exam Constitutional:      General: She is not in acute distress.    Appearance: She is not diaphoretic.  Cardiovascular:     Rate and Rhythm: Normal rate and regular rhythm.     Heart sounds: Normal heart sounds.  Pulmonary:     Effort: Pulmonary effort is normal.     Breath sounds: Normal breath sounds.  Musculoskeletal:        General: No edema.     Right lower leg: No edema.     Left lower leg: No edema.  Skin:  General: Skin is warm and dry.  Neurological:     Mental Status: She is alert.      Assessment/Plan: Please see individual problem list.  Problem List Items Addressed This Visit    Essential hypertension    Above goal in the office today though adequately controlled at home.  We will have her bring her blood pressure cuff in for nurse visit.  She will continue amlodipine 10 mg once daily and losartan 50 mg once daily.  Check labs.      Relevant Orders   Comp Met (CMET)   Lipid panel   TSH   CBC   Insomnia    Chronic issue.  Stable on lorazepam 1 mg nightly.  She can continue that medication at this time.  Discussed risk of dependence and addiction with this type of medication.      Prediabetes    Check A1c.      Relevant Orders   HgB A1c   Rheumatoid arthritis ([place]) - Primary    Chronic ongoing issue.  Tramadol is helpful.  She can continue  tramadol 50 mg twice daily as needed for pain.  She will continue to see rheumatology.       Other Visit Diagnoses    Encounter for screening mammogram for malignant neoplasm of breast       Relevant Orders   MM 3D SCREEN BREAST BILATERAL       Health Maintenance: she will call to schedule her mammogram.      This visit occurred during the SARS-CoV-2 public health emergency.  Safety protocols were in place, including screening questions prior to the visit, additional usage of staff PPE, and extensive cleaning of exam room while observing appropriate contact time as indicated for disinfecting solutions.    Tommi Rumps, MD [place]

## 2020-05-16 ENCOUNTER — Other Ambulatory Visit: Payer: Self-pay

## 2020-05-16 ENCOUNTER — Telehealth: Payer: Self-pay | Admitting: Internal Medicine

## 2020-05-16 ENCOUNTER — Ambulatory Visit (INDEPENDENT_AMBULATORY_CARE_PROVIDER_SITE_OTHER): Payer: Medicare Other

## 2020-05-16 DIAGNOSIS — I10 Essential (primary) hypertension: Secondary | ICD-10-CM | POA: Diagnosis not present

## 2020-05-16 NOTE — Progress Notes (Signed)
Patient is here for a BP check due to bp being high at last visit, as per patient.  Currently patients BP is 138/76 and BPM is 92.Patient BP machine was very inaccurate, BP was 189/99 and BPM was 92.  Patient has no complaints of headaches, blurry vision, chest pain, arm pain, light headedness, dizziness, and nor jaw pain. Please see previous note for order.

## 2020-05-16 NOTE — Telephone Encounter (Signed)
BP check 05/16/20 BP still elevated per her machine can she purchase new one from [facility] upper arm BP cuff goal is <130/<80   Sch f/u with PCP 05/30/20 or this week in office to f/u on BP  Write bps down and call sooner than appt if elevated  -sch pt sooner appt please   She should be on norvasc 10 mg daily, losartan 50 mg daily   Dr. Olivia Mackie McLean-Scocuzza

## 2020-05-17 NOTE — Telephone Encounter (Signed)
Called and spoke with patient and scheduled a follow up with PCP.  Gustavo Meditz,cma

## 2020-05-20 ENCOUNTER — Other Ambulatory Visit: Payer: Self-pay | Admitting: Family Medicine

## 2020-05-20 DIAGNOSIS — R7989 Other specified abnormal findings of blood chemistry: Secondary | ICD-10-CM

## 2020-05-20 DIAGNOSIS — E782 Mixed hyperlipidemia: Secondary | ICD-10-CM

## 2020-05-20 DIAGNOSIS — E038 Other specified hypothyroidism: Secondary | ICD-10-CM

## 2020-05-20 MED ORDER — OMEGA-3-ACID ETHYL ESTERS 1 G PO CAPS: 2.0000 g | ORAL_CAPSULE | Freq: Two times a day (BID) | ORAL | 1 refills | Status: DC

## 2020-05-22 ENCOUNTER — Encounter: Payer: Self-pay | Admitting: Family Medicine

## 2020-05-27 ENCOUNTER — Other Ambulatory Visit: Payer: Self-pay

## 2020-05-27 ENCOUNTER — Other Ambulatory Visit: Payer: Self-pay | Admitting: Family Medicine

## 2020-05-28 ENCOUNTER — Other Ambulatory Visit (INDEPENDENT_AMBULATORY_CARE_PROVIDER_SITE_OTHER): Payer: Medicare Other

## 2020-05-28 DIAGNOSIS — R7303 Prediabetes: Secondary | ICD-10-CM

## 2020-05-28 DIAGNOSIS — I10 Essential (primary) hypertension: Secondary | ICD-10-CM

## 2020-05-28 DIAGNOSIS — E038 Other specified hypothyroidism: Secondary | ICD-10-CM

## 2020-05-28 DIAGNOSIS — R7989 Other specified abnormal findings of blood chemistry: Secondary | ICD-10-CM | POA: Diagnosis not present

## 2020-05-28 LAB — TSH: TSH: 4.21 u[IU]/mL (ref 0.35–4.50)

## 2020-05-28 LAB — T4, FREE: Free T4: 1 ng/dL (ref 0.60–1.60)

## 2020-05-29 ENCOUNTER — Other Ambulatory Visit: Payer: Self-pay

## 2020-05-29 ENCOUNTER — Ambulatory Visit (INDEPENDENT_AMBULATORY_CARE_PROVIDER_SITE_OTHER): Payer: Medicare Other | Admitting: Family Medicine

## 2020-05-29 ENCOUNTER — Encounter: Payer: Self-pay | Admitting: Family Medicine

## 2020-05-29 DIAGNOSIS — I10 Essential (primary) hypertension: Secondary | ICD-10-CM

## 2020-05-29 MED ORDER — LOSARTAN POTASSIUM 100 MG PO TABS
100.0000 mg | ORAL_TABLET | Freq: Every day | ORAL | 1 refills | Status: DC
Start: 2020-05-29 — End: 2020-06-28

## 2020-05-29 NOTE — Progress Notes (Signed)
Tommi Rumps, MD Phone: [phone]  Audrey Hamilton is a 75 y.o. female who presents today for f/u.  HYPERTENSION  Disease Monitoring  Home BP Monitoring 149-161/69-99, this is on a new BP cuff Chest pain- no    Dyspnea- no Medications  Compliance-  Taking amlodipine 10 mg daily, losartan 50 mg daily.   Edema- no    Social History   Tobacco Use  Smoking Status Former Smoker  . Quit date: 06/19/1995  . Years since quitting: 24.9  Smokeless Tobacco Never Used    Current Outpatient Medications on File Prior to Visit  Medication Sig Dispense Refill  . amLODipine (NORVASC) 10 MG tablet TAKE 1 TABLET BY MOUTH EVERY DAY 90 tablet 1  . atorvastatin (LIPITOR) 80 MG tablet TAKE 1 TABLET BY MOUTH EVERY DAY 90 tablet 1  . Ferrous Fum-Iron Polysacch (TANDEM PO) Take 106 mg by mouth daily.    . fluticasone (FLONASE) 50 MCG/ACT nasal spray Place into the nose at bedtime. Reported on 10/01/2015    . leflunomide (ARAVA) 10 MG tablet 1 tab daily x 90 days    . loratadine (CLARITIN) 10 MG tablet Take 1 tablet (10 mg total) by mouth daily. 30 tablet 11  . LORazepam (ATIVAN) 1 MG tablet TAKE 1 TABLET BY MOUTH EVERYDAY AT BEDTIME 30 tablet 1  . montelukast (SINGULAIR) 10 MG tablet TAKE 1 TABLET BY MOUTH EVERYDAY AT BEDTIME 90 tablet 0  . omega-3 acid ethyl esters (LOVAZA) 1 g capsule Take 2 capsules (2 g total) by mouth 2 (two) times daily. 360 capsule 1  . omeprazole (PRILOSEC) 20 MG capsule Take by mouth.     . potassium chloride SA (K-DUR,KLOR-CON) 20 MEQ tablet Take 1 tablet (20 mEq total) by mouth daily. 6 tablet 0  . traMADol (ULTRAM) 50 MG tablet TAKE 1 TABLET BY MOUTH EVERY 8 HOURS AS NEEDED 90 tablet 0   No current facility-administered medications on file prior to visit.     ROS see history of present illness  Objective  Physical Exam Vitals:   05/29/20 1522  BP: 130/80  Pulse: 92  Temp: 98.5 F (36.9 C)  SpO2: 95%    BP Readings from Last 3 Encounters:  05/29/20  130/80  05/13/20 (!) 167/94  09/05/19 130/79   Wt Readings from Last 3 Encounters:  05/29/20 173 lb (78.5 kg)  05/13/20 173 lb 6.4 oz (78.7 kg)  01/09/20 179 lb (81.2 kg)    Physical Exam Constitutional:      General: She is not in acute distress.    Appearance: She is not diaphoretic.  Cardiovascular:     Rate and Rhythm: Normal rate and regular rhythm.     Heart sounds: Normal heart sounds.  Pulmonary:     Effort: Pulmonary effort is normal.     Breath sounds: Normal breath sounds.  Musculoskeletal:        General: No edema.  Skin:    General: Skin is warm and dry.  Neurological:     Mental Status: She is alert.      Assessment/Plan: Please see individual problem list.  Problem List Items Addressed This Visit    Essential hypertension    Above goal at home.  We will increase her losartan to 100 mg daily.  She will continue amlodipine 10 mg daily.  She will follow-up for labs in 1 week and follow-up with me in 1 month.      Relevant Medications   losartan (COZAAR) 100 MG tablet  Other Relevant Orders   Basic Metabolic Panel (BMET)      The patient reports her daughter has a lung tumor and is going to have surgery.  She notes this has been stressful though she has been staying strong.  I offered support.  Advise she could contact us at any point if she needed to.    This visit occurred during the SARS-CoV-2 public health emergency.  Safety protocols were in place, including screening questions prior to the visit, additional usage of staff PPE, and extensive cleaning of exam room while observing appropriate contact time as indicated for disinfecting solutions.    Tommi Rumps, MD [place]

## 2020-05-29 NOTE — Patient Instructions (Signed)
Nice to see you. We will increase your losartan to 100 mg once daily.  He will continue the amlodipine.  We will see you back in 1 week for labs and follow-up with me in 1 month.

## 2020-05-29 NOTE — Assessment & Plan Note (Signed)
Above goal at home.  We will increase her losartan to 100 mg daily.  She will continue amlodipine 10 mg daily.  She will follow-up for labs in 1 week and follow-up with me in 1 month.

## 2020-06-05 ENCOUNTER — Other Ambulatory Visit: Payer: Self-pay

## 2020-06-05 ENCOUNTER — Other Ambulatory Visit (INDEPENDENT_AMBULATORY_CARE_PROVIDER_SITE_OTHER): Payer: Medicare Other

## 2020-06-05 DIAGNOSIS — I10 Essential (primary) hypertension: Secondary | ICD-10-CM | POA: Diagnosis not present

## 2020-06-05 DIAGNOSIS — M5136 Other intervertebral disc degeneration, lumbar region: Secondary | ICD-10-CM | POA: Diagnosis not present

## 2020-06-05 DIAGNOSIS — M9905 Segmental and somatic dysfunction of pelvic region: Secondary | ICD-10-CM | POA: Diagnosis not present

## 2020-06-05 DIAGNOSIS — M9903 Segmental and somatic dysfunction of lumbar region: Secondary | ICD-10-CM | POA: Diagnosis not present

## 2020-06-05 DIAGNOSIS — M62830 Muscle spasm of back: Secondary | ICD-10-CM | POA: Diagnosis not present

## 2020-06-05 LAB — BASIC METABOLIC PANEL
BUN: 12 mg/dL (ref 6–23)
CO2: 27 mEq/L (ref 19–32)
Calcium: 9.7 mg/dL (ref 8.4–10.5)
Chloride: 105 mEq/L (ref 96–112)
Creatinine, Ser: 0.58 mg/dL (ref 0.40–1.20)
GFR: 89.4 mL/min (ref >60.00)
Glucose, Bld: 101 mg/dL — ABNORMAL HIGH (ref 70–99)
Potassium: 4 mEq/L (ref 3.5–5.1)
Sodium: 140 mEq/L (ref 135–145)

## 2020-06-07 ENCOUNTER — Ambulatory Visit
Admission: RE | Admit: 2020-06-07 | Discharge: 2020-06-07 | Disposition: A | Discharge status: Home / Self Care | Payer: Medicare Other | Source: Ambulatory Visit | Attending: Family Medicine | Admitting: Family Medicine

## 2020-06-07 ENCOUNTER — Other Ambulatory Visit: Payer: Self-pay

## 2020-06-07 ENCOUNTER — Other Ambulatory Visit: Payer: Self-pay | Admitting: Family Medicine

## 2020-06-07 DIAGNOSIS — Z1231 Encounter for screening mammogram for malignant neoplasm of breast: Secondary | ICD-10-CM

## 2020-06-11 ENCOUNTER — Other Ambulatory Visit: Payer: Self-pay | Admitting: Family Medicine

## 2020-06-17 ENCOUNTER — Telehealth: Payer: Self-pay

## 2020-06-17 NOTE — Telephone Encounter (Signed)
Approval for 05/20/2020 until further notice for Omega capsule 1 GM.  Mikita Lesmeister,cma

## 2020-06-26 ENCOUNTER — Other Ambulatory Visit: Payer: Self-pay | Admitting: Family Medicine

## 2020-06-26 DIAGNOSIS — I10 Essential (primary) hypertension: Secondary | ICD-10-CM

## 2020-06-26 NOTE — Telephone Encounter (Signed)
Losartan on backorder please advise change appropriate.

## 2020-06-28 NOTE — Telephone Encounter (Signed)
Patient has FU scheduled with PCP 07/15/20 is this ok.

## 2020-06-28 NOTE — Telephone Encounter (Signed)
Noted.  Telmisartan sent to pharmacy.  The patient needs a blood pressure check and lab work in about 10 days to make sure this is working adequately and her kidneys tolerate it.  Please get her scheduled for nurse BP check and lab appointment.  Thanks.

## 2020-06-29 NOTE — Telephone Encounter (Signed)
That will be fine. 

## 2020-07-03 DIAGNOSIS — M9905 Segmental and somatic dysfunction of pelvic region: Secondary | ICD-10-CM | POA: Diagnosis not present

## 2020-07-03 DIAGNOSIS — M62830 Muscle spasm of back: Secondary | ICD-10-CM | POA: Diagnosis not present

## 2020-07-03 DIAGNOSIS — M5136 Other intervertebral disc degeneration, lumbar region: Secondary | ICD-10-CM | POA: Diagnosis not present

## 2020-07-03 DIAGNOSIS — M9903 Segmental and somatic dysfunction of lumbar region: Secondary | ICD-10-CM | POA: Diagnosis not present

## 2020-07-11 ENCOUNTER — Other Ambulatory Visit: Payer: Self-pay

## 2020-07-15 ENCOUNTER — Other Ambulatory Visit: Payer: Self-pay

## 2020-07-15 ENCOUNTER — Ambulatory Visit (INDEPENDENT_AMBULATORY_CARE_PROVIDER_SITE_OTHER): Payer: Medicare Other | Admitting: Family Medicine

## 2020-07-15 ENCOUNTER — Other Ambulatory Visit: Payer: Self-pay | Admitting: Family Medicine

## 2020-07-15 ENCOUNTER — Encounter: Payer: Self-pay | Admitting: Family Medicine

## 2020-07-15 DIAGNOSIS — I10 Essential (primary) hypertension: Secondary | ICD-10-CM

## 2020-07-15 DIAGNOSIS — E785 Hyperlipidemia, unspecified: Secondary | ICD-10-CM

## 2020-07-15 LAB — POCT URINALYSIS DIPSTICK
Bilirubin, UA: NEGATIVE
Blood, UA: NEGATIVE
Glucose, UA: NEGATIVE
Leukocytes, UA: NEGATIVE
Nitrite, UA: NEGATIVE
Protein, UA: NEGATIVE
Spec Grav, UA: 1.025 (ref 1.010–1.025)
Urobilinogen, UA: 0.2 E.U./dL
pH, UA: 5 (ref 5.0–8.0)

## 2020-07-15 LAB — BASIC METABOLIC PANEL
BUN: 15 mg/dL (ref 6–23)
CO2: 27 mEq/L (ref 19–32)
Calcium: 9.5 mg/dL (ref 8.4–10.5)
Chloride: 103 mEq/L (ref 96–112)
Creatinine, Ser: 0.57 mg/dL (ref 0.40–1.20)
GFR: 89.71 mL/min (ref >60.00)
Glucose, Bld: 109 mg/dL — ABNORMAL HIGH (ref 70–99)
Potassium: 3.7 mEq/L (ref 3.5–5.1)
Sodium: 141 mEq/L (ref 135–145)

## 2020-07-15 MED ORDER — TELMISARTAN 80 MG PO TABS: 80.0000 mg | ORAL_TABLET | Freq: Every day | ORAL | 1 refills | Status: DC

## 2020-07-15 NOTE — Progress Notes (Signed)
Tommi Rumps, MD Phone: [phone]  Audrey Hamilton is a 75 y.o. female who presents today for f/u.  HYPERTENSION  Disease Monitoring  Home BP Monitoring 123-163/55-83 Chest pain- no    Dyspnea- no Medications  Compliance-  Taking amlodipine, telmisartan. Lightheadedness-  no  Edema- no  HYPERLIPIDEMIA Symptoms Chest pain on exertion:  no   Medications: Compliance- taking lipitor Right upper quadrant pain- no  Muscle aches- no changes to chronic RA issues      Social History   Tobacco Use  Smoking Status Former Smoker  . Quit date: 06/19/1995  . Years since quitting: 25.0  Smokeless Tobacco Never Used    Current Outpatient Medications on File Prior to Visit  Medication Sig Dispense Refill  . amLODipine (NORVASC) 10 MG tablet TAKE 1 TABLET BY MOUTH EVERY DAY 90 tablet 1  . atorvastatin (LIPITOR) 80 MG tablet TAKE 1 TABLET BY MOUTH EVERY DAY 90 tablet 1  . escitalopram (LEXAPRO) 10 MG tablet Take 1 tablet by mouth daily.    . Ferrous Fum-Iron Polysacch (TANDEM PO) Take 106 mg by mouth daily.    . fluticasone (FLONASE) 50 MCG/ACT nasal spray Place into the nose at bedtime. Reported on 10/01/2015    . leflunomide (ARAVA) 10 MG tablet 1 tab daily x 90 days    . loratadine (CLARITIN) 10 MG tablet Take 1 tablet (10 mg total) by mouth daily. 30 tablet 11  . LORazepam (ATIVAN) 1 MG tablet TAKE 1 TABLET BY MOUTH EVERYDAY AT BEDTIME 30 tablet 1  . montelukast (SINGULAIR) 10 MG tablet TAKE 1 TABLET BY MOUTH EVERYDAY AT BEDTIME 90 tablet 0  . omega-3 acid ethyl esters (LOVAZA) 1 g capsule Take 2 capsules (2 g total) by mouth 2 (two) times daily. 360 capsule 1  . omeprazole (PRILOSEC) 20 MG capsule Take by mouth.     . potassium chloride SA (K-DUR,KLOR-CON) 20 MEQ tablet Take 1 tablet (20 mEq total) by mouth daily. 6 tablet 0  . telmisartan (MICARDIS) 40 MG tablet Take 1 tablet (40 mg total) by mouth daily. 90 tablet 1  . traMADol (ULTRAM) 50 MG tablet TAKE 1 TABLET BY MOUTH  EVERY 8 HOURS AS NEEDED 90 tablet 0   No current facility-administered medications on file prior to visit.     ROS see history of present illness  Objective  Physical Exam Vitals:   07/15/20 0913 07/15/20 0919  BP: (!) 150/80 130/80  Pulse: 82   Temp: (!) 97.5 F (36.4 C)   SpO2: 96%     BP Readings from Last 3 Encounters:  07/15/20 130/80  05/29/20 130/80  05/13/20 (!) 167/94   Wt Readings from Last 3 Encounters:  07/15/20 171 lb 12.8 oz (77.9 kg)  05/29/20 173 lb (78.5 kg)  05/13/20 173 lb 6.4 oz (78.7 kg)    Physical Exam Constitutional:      General: She is not in acute distress.    Appearance: She is not diaphoretic.  Cardiovascular:     Rate and Rhythm: Normal rate and regular rhythm.     Heart sounds: Normal heart sounds.  Pulmonary:     Effort: Pulmonary effort is normal.     Breath sounds: Normal breath sounds.  Musculoskeletal:     Right lower leg: No edema.     Left lower leg: No edema.  Skin:    General: Skin is warm and dry.  Neurological:     Mental Status: She is alert.      Assessment/Plan: Please  see individual problem list.  Problem List Items Addressed This Visit    Essential hypertension    Above goal.  She was just transitioned to telmisartan 40 mg about a month ago.  She needs follow-up labs for that and if her kidney function electrolytes are acceptable we will increase this to 80 mg once daily.  Taking amlodipine as well which she will continue.  We will also check urinalysis for protein.      Relevant Orders   Basic Metabolic Panel (BMET)   POCT Urinalysis Dipstick   HLD (hyperlipidemia)    Adequate control when checked in January.  She will continue Lipitor 80 mg once daily.         This visit occurred during the SARS-CoV-2 public health emergency.  Safety protocols were in place, including screening questions prior to the visit, additional usage of staff PPE, and extensive cleaning of exam room while observing appropriate  contact time as indicated for disinfecting solutions.    Tommi Rumps, MD [place]

## 2020-07-15 NOTE — Assessment & Plan Note (Signed)
Adequate control when checked in January.  She will continue Lipitor 80 mg once daily.

## 2020-07-15 NOTE — Patient Instructions (Signed)
Nice to see you. We will get labs today and contact you with the results. Once we receive your labs we will likely increase her telmisartan to 80 mg once daily.

## 2020-07-15 NOTE — Assessment & Plan Note (Signed)
Above goal.  She was just transitioned to telmisartan 40 mg about a month ago.  She needs follow-up labs for that and if her kidney function electrolytes are acceptable we will increase this to 80 mg once daily.  Taking amlodipine as well which she will continue.  We will also check urinalysis for protein.

## 2020-07-15 NOTE — Addendum Note (Signed)
Addended by: Leeanne Rio on: 07/15/2020 01:00 PM   Modules accepted: Orders

## 2020-07-17 DIAGNOSIS — M170 Bilateral primary osteoarthritis of knee: Secondary | ICD-10-CM | POA: Diagnosis not present

## 2020-07-17 DIAGNOSIS — Z79899 Other long term (current) drug therapy: Secondary | ICD-10-CM | POA: Diagnosis not present

## 2020-07-17 DIAGNOSIS — M0609 Rheumatoid arthritis without rheumatoid factor, multiple sites: Secondary | ICD-10-CM | POA: Diagnosis not present

## 2020-07-18 ENCOUNTER — Other Ambulatory Visit: Payer: Self-pay | Admitting: Family Medicine

## 2020-07-19 ENCOUNTER — Other Ambulatory Visit: Payer: Self-pay | Admitting: Family Medicine

## 2020-07-19 DIAGNOSIS — E782 Mixed hyperlipidemia: Secondary | ICD-10-CM

## 2020-07-24 ENCOUNTER — Other Ambulatory Visit: Payer: Self-pay | Admitting: Family Medicine

## 2020-07-24 ENCOUNTER — Other Ambulatory Visit: Payer: Self-pay

## 2020-07-24 ENCOUNTER — Other Ambulatory Visit (INDEPENDENT_AMBULATORY_CARE_PROVIDER_SITE_OTHER): Payer: Medicare Other

## 2020-07-24 DIAGNOSIS — I10 Essential (primary) hypertension: Secondary | ICD-10-CM

## 2020-07-25 ENCOUNTER — Other Ambulatory Visit: Payer: Self-pay | Admitting: Family Medicine

## 2020-07-25 LAB — BASIC METABOLIC PANEL
BUN: 9 mg/dL (ref 6–23)
CO2: 29 mEq/L (ref 19–32)
Calcium: 9.6 mg/dL (ref 8.4–10.5)
Chloride: 104 mEq/L (ref 96–112)
Creatinine, Ser: 0.53 mg/dL (ref 0.40–1.20)
GFR: 91.28 mL/min (ref >60.00)
Glucose, Bld: 75 mg/dL (ref 70–99)
Potassium: 4.1 mEq/L (ref 3.5–5.1)
Sodium: 141 mEq/L (ref 135–145)

## 2020-07-31 DIAGNOSIS — M62830 Muscle spasm of back: Secondary | ICD-10-CM | POA: Diagnosis not present

## 2020-07-31 DIAGNOSIS — M5136 Other intervertebral disc degeneration, lumbar region: Secondary | ICD-10-CM | POA: Diagnosis not present

## 2020-07-31 DIAGNOSIS — M9905 Segmental and somatic dysfunction of pelvic region: Secondary | ICD-10-CM | POA: Diagnosis not present

## 2020-07-31 DIAGNOSIS — M9903 Segmental and somatic dysfunction of lumbar region: Secondary | ICD-10-CM | POA: Diagnosis not present

## 2020-08-08 ENCOUNTER — Other Ambulatory Visit: Payer: Self-pay

## 2020-08-12 ENCOUNTER — Encounter: Payer: Self-pay | Admitting: Family Medicine

## 2020-08-12 ENCOUNTER — Other Ambulatory Visit: Payer: Self-pay

## 2020-08-12 ENCOUNTER — Ambulatory Visit (INDEPENDENT_AMBULATORY_CARE_PROVIDER_SITE_OTHER): Payer: Medicare Other | Admitting: Family Medicine

## 2020-08-12 DIAGNOSIS — I10 Essential (primary) hypertension: Secondary | ICD-10-CM

## 2020-08-12 DIAGNOSIS — M069 Rheumatoid arthritis, unspecified: Secondary | ICD-10-CM | POA: Diagnosis not present

## 2020-08-12 DIAGNOSIS — G4700 Insomnia, unspecified: Secondary | ICD-10-CM | POA: Diagnosis not present

## 2020-08-12 NOTE — Progress Notes (Signed)
I called the [facility] pharmacy and the losartan was cancelled.  Caelie [place],cma

## 2020-08-12 NOTE — Patient Instructions (Signed)
Nice to see you. If your blood pressure starts to trend up at all please let us know.

## 2020-08-12 NOTE — Assessment & Plan Note (Signed)
Blood pressures have been improving.  She has only been on the telmisartan 80 mg once daily for a couple of weeks now.  We will have her continue with that and amlodipine 10 mg once daily.  She will continue to monitor her blood pressures as there may be additional benefit over the next several weeks.  We will see her back in 6 weeks and if needed we can increase the telmisartan at that time.  She will monitor her blood pressure and if it trends up at all she will let us know.

## 2020-08-12 NOTE — Assessment & Plan Note (Signed)
Stable.  She can continue lorazepam 1 mg nightly.  Discussed risk of addiction and dependence.  Advised if she ever wanted to stop this medication or the tramadol she would need to let us know so we can taper off.

## 2020-08-12 NOTE — Assessment & Plan Note (Addendum)
Chronic ongoing issue.  She will continue to see rheumatology.  She can continue on tramadol 50 mg twice daily as needed for pain.  Discussed risk of addiction and dependence on this medication.

## 2020-08-12 NOTE — Progress Notes (Signed)
Tommi Rumps, MD Phone: [phone]  Audrey Hamilton is a 75 y.o. female who presents today for f/u.  HYPERTENSION  Disease Monitoring  Home BP Monitoring 135-150/58-72 Chest pain- no    Dyspnea- no Medications  Compliance-  Taking telmisartan, amlodipine. Lightheadedness-  no  Edema- no  Insomnia: Takes lorazepam nightly.  She gets 7 hours of sleep.  She notes no drowsiness or alcohol intake.  She notes lorazepam has been beneficial.  She has been on this for many years.  Rheumatoid arthritis: She follows with rheumatology.  She takes tramadol twice daily for this.  This is beneficial as it takes the edge off.  No drowsiness with this.  Social History   Tobacco Use  Smoking Status Former Smoker  . Quit date: 06/19/1995  . Years since quitting: 25.1  Smokeless Tobacco Never Used    Current Outpatient Medications on File Prior to Visit  Medication Sig Dispense Refill  . amLODipine (NORVASC) 10 MG tablet TAKE 1 TABLET BY MOUTH EVERY DAY 90 tablet 1  . atorvastatin (LIPITOR) 80 MG tablet TAKE 1 TABLET BY MOUTH EVERY DAY 90 tablet 1  . escitalopram (LEXAPRO) 10 MG tablet Take 1 tablet by mouth daily.    . Ferrous Fum-Iron Polysacch (TANDEM PO) Take 106 mg by mouth daily.    . fluticasone (FLONASE) 50 MCG/ACT nasal spray Place into the nose at bedtime. Reported on 10/01/2015    . leflunomide (ARAVA) 10 MG tablet 1 tab daily x 90 days    . loratadine (CLARITIN) 10 MG tablet Take 1 tablet (10 mg total) by mouth daily. 30 tablet 11  . LORazepam (ATIVAN) 1 MG tablet TAKE 1 TABLET BY MOUTH EVERYDAY AT BEDTIME 30 tablet 1  . montelukast (SINGULAIR) 10 MG tablet TAKE 1 TABLET BY MOUTH EVERYDAY AT BEDTIME 90 tablet 0  . omega-3 acid ethyl esters (LOVAZA) 1 g capsule Take 2 capsules (2 g total) by mouth 2 (two) times daily. 360 capsule 1  . omeprazole (PRILOSEC) 20 MG capsule Take by mouth.     . potassium chloride SA (K-DUR,KLOR-CON) 20 MEQ tablet Take 1 tablet (20 mEq total) by mouth  daily. 6 tablet 0  . telmisartan (MICARDIS) 80 MG tablet Take 1 tablet (80 mg total) by mouth daily. 90 tablet 1  . traMADol (ULTRAM) 50 MG tablet TAKE 1 TABLET BY MOUTH EVERY 8 HOURS AS NEEDED 90 tablet 0   No current facility-administered medications on file prior to visit.     ROS see history of present illness  Objective  Physical Exam Vitals:   08/12/20 1029  BP: 130/80  Pulse: 82  Temp: 98 F (36.7 C)  SpO2: 97%    BP Readings from Last 3 Encounters:  08/12/20 130/80  07/15/20 130/80  05/29/20 130/80   Wt Readings from Last 3 Encounters:  08/12/20 169 lb 12.8 oz (77 kg)  07/15/20 171 lb 12.8 oz (77.9 kg)  05/29/20 173 lb (78.5 kg)    Physical Exam Constitutional:      General: She is not in acute distress.    Appearance: She is not diaphoretic.  Cardiovascular:     Rate and Rhythm: Normal rate and regular rhythm.     Heart sounds: Normal heart sounds.  Pulmonary:     Effort: Pulmonary effort is normal.     Breath sounds: Normal breath sounds.  Musculoskeletal:     Right lower leg: No edema.     Left lower leg: No edema.  Skin:  General: Skin is warm and dry.  Neurological:     Mental Status: She is alert.      Assessment/Plan: Please see individual problem list.  Problem List Items Addressed This Visit    Insomnia    Stable.  She can continue lorazepam 1 mg nightly.  Discussed risk of addiction and dependence.  Advised if she ever wanted to stop this medication or the tramadol she would need to let us know so we can taper off.      Rheumatoid arthritis (HCC)    Chronic ongoing issue.  She will continue to see rheumatology.  She can continue on tramadol 50 mg twice daily as needed for pain.  Discussed risk of addiction and dependence on this medication.      Essential hypertension    Blood pressures have been improving.  She has only been on the telmisartan 80 mg once daily for a couple of weeks now.  We will have her continue with that and  amlodipine 10 mg once daily.  She will continue to monitor her blood pressures as there may be additional benefit over the next several weeks.  We will see her back in 6 weeks and if needed we can increase the telmisartan at that time.  She will monitor her blood pressure and if it trends up at all she will let us know.          This visit occurred during the SARS-CoV-2 public health emergency.  Safety protocols were in place, including screening questions prior to the visit, additional usage of staff PPE, and extensive cleaning of exam room while observing appropriate contact time as indicated for disinfecting solutions.    Tommi Rumps, MD [place]

## 2020-08-20 ENCOUNTER — Other Ambulatory Visit: Payer: Self-pay | Admitting: Family Medicine

## 2020-08-25 ENCOUNTER — Other Ambulatory Visit: Payer: Self-pay | Admitting: Family Medicine

## 2020-08-25 DIAGNOSIS — I10 Essential (primary) hypertension: Secondary | ICD-10-CM

## 2020-08-27 ENCOUNTER — Other Ambulatory Visit: Payer: Self-pay | Admitting: Family Medicine

## 2020-08-27 NOTE — Telephone Encounter (Signed)
RX Refill:tramadol Last Seen:08-12-20 Last ordered:07-18-20

## 2020-08-28 DIAGNOSIS — M9903 Segmental and somatic dysfunction of lumbar region: Secondary | ICD-10-CM | POA: Diagnosis not present

## 2020-08-28 DIAGNOSIS — M62830 Muscle spasm of back: Secondary | ICD-10-CM | POA: Diagnosis not present

## 2020-08-28 DIAGNOSIS — M5136 Other intervertebral disc degeneration, lumbar region: Secondary | ICD-10-CM | POA: Diagnosis not present

## 2020-08-28 DIAGNOSIS — M9905 Segmental and somatic dysfunction of pelvic region: Secondary | ICD-10-CM | POA: Diagnosis not present

## 2020-09-17 ENCOUNTER — Other Ambulatory Visit: Payer: Self-pay

## 2020-09-18 ENCOUNTER — Ambulatory Visit (INDEPENDENT_AMBULATORY_CARE_PROVIDER_SITE_OTHER): Payer: Medicare Other

## 2020-09-18 DIAGNOSIS — Z23 Encounter for immunization: Secondary | ICD-10-CM

## 2020-09-23 ENCOUNTER — Ambulatory Visit: Payer: Medicare Other | Admitting: Family Medicine

## 2020-09-30 ENCOUNTER — Other Ambulatory Visit: Payer: Self-pay | Admitting: Family Medicine

## 2020-10-01 ENCOUNTER — Other Ambulatory Visit: Payer: Self-pay

## 2020-10-01 ENCOUNTER — Ambulatory Visit (INDEPENDENT_AMBULATORY_CARE_PROVIDER_SITE_OTHER): Payer: Medicare Other | Admitting: Family Medicine

## 2020-10-01 ENCOUNTER — Encounter: Payer: Self-pay | Admitting: Family Medicine

## 2020-10-01 DIAGNOSIS — M5136 Other intervertebral disc degeneration, lumbar region: Secondary | ICD-10-CM | POA: Diagnosis not present

## 2020-10-01 DIAGNOSIS — J302 Other seasonal allergic rhinitis: Secondary | ICD-10-CM

## 2020-10-01 DIAGNOSIS — I10 Essential (primary) hypertension: Secondary | ICD-10-CM

## 2020-10-01 DIAGNOSIS — M62830 Muscle spasm of back: Secondary | ICD-10-CM | POA: Diagnosis not present

## 2020-10-01 DIAGNOSIS — M9905 Segmental and somatic dysfunction of pelvic region: Secondary | ICD-10-CM | POA: Diagnosis not present

## 2020-10-01 DIAGNOSIS — M9903 Segmental and somatic dysfunction of lumbar region: Secondary | ICD-10-CM | POA: Diagnosis not present

## 2020-10-01 MED ORDER — HYDROCHLOROTHIAZIDE 12.5 MG PO CAPS: 12.5000 mg | ORAL_CAPSULE | Freq: Every day | ORAL | 1 refills | Status: DC

## 2020-10-01 NOTE — Patient Instructions (Signed)
Nice to see you. We are going to add on hydrochlorothiazide 12.5 mg once daily.  You will continue telmisartan 80 mg once daily and amlodipine 10 mg once daily. You will return in 1 month for labs and a nurse blood pressure check. You can use your Flonase daily for your allergies.

## 2020-10-01 NOTE — Assessment & Plan Note (Signed)
She does have some intermittent symptoms.  I encouraged her to use her Flonase daily.  Discussed monitoring for any nosebleeds with that and if she develops a nosebleed she should back off on the Flonase.  She will continue Singulair and Claritin as well.

## 2020-10-01 NOTE — Progress Notes (Signed)
Audrey Rumps, MD Phone: [phone]  Audrey Hamilton is a 75 y.o. female who presents today for f/u.  HYPERTENSION Disease Monitoring Home BP Monitoring 125-151/53-79, mostly >135/65 Chest pain- no    Dyspnea- no Medications Compliance-  taking telmisartan, amlodipine.   Edema- minimal at the end of the day, improves over night No orthopnea or PND.  Allergic rhinitis: Patient has been taking Singulair and Claritin.  She only uses her Flonase as needed.  She reports intermittent sneezing as well as postnasal drip and itchy watery eyes.  No rhinorrhea.   Social History   Tobacco Use  Smoking Status Former   Pack years: 0.00   Types: Cigarettes   Quit date: 06/19/1995   Years since quitting: 25.3  Smokeless Tobacco Never    Current Outpatient Medications on File Prior to Visit  Medication Sig Dispense Refill   amLODipine (NORVASC) 10 MG tablet TAKE 1 TABLET BY MOUTH EVERY DAY 90 tablet 1   atorvastatin (LIPITOR) 80 MG tablet TAKE 1 TABLET BY MOUTH EVERY DAY 90 tablet 1   escitalopram (LEXAPRO) 10 MG tablet Take 1 tablet by mouth daily.     Ferrous Fum-Iron Polysacch (TANDEM PO) Take 106 mg by mouth daily.     fluticasone (FLONASE) 50 MCG/ACT nasal spray Place into the nose at bedtime. Reported on 10/01/2015     leflunomide (ARAVA) 10 MG tablet 1 tab daily x 90 days     loratadine (CLARITIN) 10 MG tablet TAKE 1 TABLET BY MOUTH EVERY DAY 90 tablet 3   LORazepam (ATIVAN) 1 MG tablet TAKE 1 TABLET BY MOUTH EVERYDAY AT BEDTIME 30 tablet 1   montelukast (SINGULAIR) 10 MG tablet TAKE 1 TABLET BY MOUTH EVERYDAY AT BEDTIME 90 tablet 0   omega-3 acid ethyl esters (LOVAZA) 1 g capsule Take 2 capsules (2 g total) by mouth 2 (two) times daily. 360 capsule 1   omeprazole (PRILOSEC) 20 MG capsule Take by mouth.      potassium chloride SA (K-DUR,KLOR-CON) 20 MEQ tablet Take 1 tablet (20 mEq total) by mouth daily. 6 tablet 0   telmisartan (MICARDIS) 80 MG tablet Take 1 tablet (80 mg total)  by mouth daily. 90 tablet 1   traMADol (ULTRAM) 50 MG tablet TAKE 1 TABLET BY MOUTH EVERY 8 HOURS AS NEEDED 90 tablet 1   No current facility-administered medications on file prior to visit.     ROS see history of present illness  Objective  Physical Exam Vitals:   10/01/20 1133  BP: 140/80  Pulse: 81  Temp: 97.6 F (36.4 C)  SpO2: 97%    BP Readings from Last 3 Encounters:  10/01/20 140/80  08/12/20 130/80  07/15/20 130/80   Wt Readings from Last 3 Encounters:  10/01/20 169 lb 12.8 oz (77 kg)  08/12/20 169 lb 12.8 oz (77 kg)  07/15/20 171 lb 12.8 oz (77.9 kg)    Physical Exam Constitutional:      General: She is not in acute distress.    Appearance: She is not diaphoretic.  Cardiovascular:     Rate and Rhythm: Normal rate and regular rhythm.     Heart sounds: Normal heart sounds.  Pulmonary:     Effort: Pulmonary effort is normal.     Breath sounds: Normal breath sounds.  Musculoskeletal:     Right lower leg: No edema.     Left lower leg: No edema.  Skin:    General: Skin is warm and dry.  Neurological:     Mental  Status: She is alert.     Assessment/Plan: Please see individual problem list.  Problem List Items Addressed This Visit     Allergic rhinitis    She does have some intermittent symptoms.  I encouraged her to use her Flonase daily.  Discussed monitoring for any nosebleeds with that and if she develops a nosebleed she should back off on the Flonase.  She will continue Singulair and Claritin as well.       Essential hypertension    Not quite at goal.  We will add on hydrochlorothiazide 12.5 mg once daily.  She will continue on telmisartan 80 mg dailyand amlodipine 10 mg daily.  She will return in 1 month for labs and a nurse BP check.  She will monitor for lightheadedness or diastolic blood pressure less than 55 on more than 1 occasion.  If those things occur she will contact us.       Relevant Medications   hydrochlorothiazide (MICROZIDE)  12.5 MG capsule   Other Relevant Orders   Basic Metabolic Panel (BMET)   Return in about 1 month (around 10/31/2020) for Labs, nurse BP check, 3 months with PCP.  This visit occurred during the SARS-CoV-2 public health emergency.  Safety protocols were in place, including screening questions prior to the visit, additional usage of staff PPE, and extensive cleaning of exam room while observing appropriate contact time as indicated for disinfecting solutions.    Audrey Rumps, MD [place]

## 2020-10-01 NOTE — Assessment & Plan Note (Addendum)
Not quite at goal.  We will add on hydrochlorothiazide 12.5 mg once daily.  She will continue on telmisartan 80 mg dailyand amlodipine 10 mg daily.  She will return in 1 month for labs and a nurse BP check.  She will monitor for lightheadedness or diastolic blood pressure less than 55 on more than 1 occasion.  If those things occur she will contact us.

## 2020-10-15 ENCOUNTER — Other Ambulatory Visit: Payer: Self-pay

## 2020-10-24 DIAGNOSIS — H524 Presbyopia: Secondary | ICD-10-CM | POA: Diagnosis not present

## 2020-10-24 DIAGNOSIS — H52223 Regular astigmatism, bilateral: Secondary | ICD-10-CM | POA: Diagnosis not present

## 2020-10-24 DIAGNOSIS — H43813 Vitreous degeneration, bilateral: Secondary | ICD-10-CM | POA: Diagnosis not present

## 2020-10-24 DIAGNOSIS — Z961 Presence of intraocular lens: Secondary | ICD-10-CM | POA: Diagnosis not present

## 2020-10-24 DIAGNOSIS — H5203 Hypermetropia, bilateral: Secondary | ICD-10-CM | POA: Diagnosis not present

## 2020-10-29 DIAGNOSIS — M62830 Muscle spasm of back: Secondary | ICD-10-CM | POA: Diagnosis not present

## 2020-10-29 DIAGNOSIS — M9905 Segmental and somatic dysfunction of pelvic region: Secondary | ICD-10-CM | POA: Diagnosis not present

## 2020-10-29 DIAGNOSIS — M5136 Other intervertebral disc degeneration, lumbar region: Secondary | ICD-10-CM | POA: Diagnosis not present

## 2020-10-29 DIAGNOSIS — M9903 Segmental and somatic dysfunction of lumbar region: Secondary | ICD-10-CM | POA: Diagnosis not present

## 2020-10-31 ENCOUNTER — Ambulatory Visit (INDEPENDENT_AMBULATORY_CARE_PROVIDER_SITE_OTHER): Payer: Medicare Other

## 2020-10-31 ENCOUNTER — Other Ambulatory Visit: Payer: Self-pay

## 2020-10-31 DIAGNOSIS — I10 Essential (primary) hypertension: Secondary | ICD-10-CM | POA: Diagnosis not present

## 2020-10-31 LAB — BASIC METABOLIC PANEL
BUN: 22 mg/dL (ref 6–23)
CO2: 29 mEq/L (ref 19–32)
Calcium: 10.3 mg/dL (ref 8.4–10.5)
Chloride: 99 mEq/L (ref 96–112)
Creatinine, Ser: 0.86 mg/dL (ref 0.40–1.20)
GFR: 66.55 mL/min (ref >60.00)
Glucose, Bld: 104 mg/dL — ABNORMAL HIGH (ref 70–99)
Potassium: 4.7 mEq/L (ref 3.5–5.1)
Sodium: 137 mEq/L (ref 135–145)

## 2020-10-31 NOTE — Progress Notes (Signed)
This will need to be cosigned by a provider that is in the office today. Is there a particular reason she stopped the amlodipine? Her BP is generally the same as it was previously in the office.

## 2020-10-31 NOTE — Progress Notes (Signed)
Patient here for nurse visit BP check per order from Dr. Caryl Bis on 10/01/20.   Patient reports compliance with prescribed BP medications: no. Per note by provider, Pt was to continue taking the Telmisartan 80mg , Amlodipine 10mg , and start the Hydrochlorothiazide 12.5mg . Pt reports taking the Telmisartan and Hydrochlorothiazide but has stopped taking the Amlodipine.  Last dose of BP medication:   BP Readings from Last 3 Encounters:  10/31/20 136/76  10/01/20 140/80  08/12/20 130/80   Pulse Readings from Last 3 Encounters:  10/31/20 85  10/01/20 81  08/12/20 82    1st blood pressure reading was 146/80. Patient was instructed to rest for 10 minutes and BP was rechecked. Second reading was 136/76. Pt reports no Dizziness, headaches, or Light headedness.  Learned, CMA

## 2020-11-04 ENCOUNTER — Other Ambulatory Visit: Payer: Self-pay

## 2020-11-04 ENCOUNTER — Other Ambulatory Visit: Payer: Self-pay | Admitting: Family Medicine

## 2020-11-04 ENCOUNTER — Ambulatory Visit (INDEPENDENT_AMBULATORY_CARE_PROVIDER_SITE_OTHER): Payer: Medicare Other

## 2020-11-04 VITALS — Ht 64.0 in | Wt 169.0 lb

## 2020-11-04 DIAGNOSIS — Z0000 Encounter for general adult medical examination without abnormal findings: Secondary | ICD-10-CM | POA: Diagnosis not present

## 2020-11-04 DIAGNOSIS — I10 Essential (primary) hypertension: Secondary | ICD-10-CM

## 2020-11-04 NOTE — Patient Instructions (Signed)
Audrey Hamilton , Thank you for taking time to come for your Medicare Wellness Visit. I appreciate your ongoing commitment to your health goals. Please review the following plan we discussed and let me know if I can assist you in the future.   These are the goals we discussed:  Goals       Patient Stated     Maintain healthy lifestyle (pt-stated)      Stay active Healthy diet        This is a list of the screening recommended for you and due dates:  Health Maintenance  Topic Date Due   Zoster (Shingles) Vaccine (1 of 2) 02/04/2021*   Flu Shot  11/18/2020   COVID-19 Vaccine (5 - Booster for Pfizer series) 01/18/2021   Colon Cancer Screening  07/03/2021   Mammogram  06/07/2022   Tetanus Vaccine  11/01/2028   DEXA scan (bone density measurement)  Completed   Hepatitis C Screening: USPSTF Recommendation to screen - Ages 69-79 yo.  Completed   Pneumonia vaccines  Addressed   HPV Vaccine  Aged Out  *Topic was postponed. The date shown is not the original due date.    Advanced directives: End of life planning; Advance aging; Advanced directives discussed.  Copy of current HCPOA/Living Will requested.    Conditions/risks identified: none new  Follow up in one year for your annual wellness visit    Preventive Care 65 Years and Older, Female Preventive care refers to lifestyle choices and visits with your health care provider that can promote health and wellness. What does preventive care include? A yearly physical exam. This is also called an annual well check. Dental exams once or twice a year. Routine eye exams. Ask your health care provider how often you should have your eyes checked. Personal lifestyle choices, including: Daily care of your teeth and gums. Regular physical activity. Eating a healthy diet. Avoiding tobacco and drug use. Limiting alcohol use. Practicing safe sex. Taking low-dose aspirin every day. Taking vitamin and mineral supplements as recommended by  your health care provider. What happens during an annual well check? The services and screenings done by your health care provider during your annual well check will depend on your age, overall health, lifestyle risk factors, and family history of disease. Counseling  Your health care provider may ask you questions about your: Alcohol use. Tobacco use. Drug use. Emotional well-being. Home and relationship well-being. Sexual activity. Eating habits. History of falls. Memory and ability to understand (cognition). Work and work Statistician. Reproductive health. Screening  You may have the following tests or measurements: Height, weight, and BMI. Blood pressure. Lipid and cholesterol levels. These may be checked every 5 years, or more frequently if you are over 32 years old. Skin check. Lung cancer screening. You may have this screening every year starting at age 22 if you have a 30-pack-year history of smoking and currently smoke or have quit within the past 15 years. Fecal occult blood test (FOBT) of the stool. You may have this test every year starting at age 79. Flexible sigmoidoscopy or colonoscopy. You may have a sigmoidoscopy every 5 years or a colonoscopy every 10 years starting at age 73. Hepatitis C blood test. Hepatitis B blood test. Sexually transmitted disease (STD) testing. Diabetes screening. This is done by checking your blood sugar (glucose) after you have not eaten for a while (fasting). You may have this done every 1-3 years. Bone density scan. This is done to screen for osteoporosis. You may  have this done starting at age 35. Mammogram. This may be done every 1-2 years. Talk to your health care provider about how often you should have regular mammograms. Talk with your health care provider about your test results, treatment options, and if necessary, the need for more tests. Vaccines  Your health care provider may recommend certain vaccines, such as: Influenza  vaccine. This is recommended every year. Tetanus, diphtheria, and acellular pertussis (Tdap, Td) vaccine. You may need a Td booster every 10 years. Zoster vaccine. You may need this after age 35. Pneumococcal 13-valent conjugate (PCV13) vaccine. One dose is recommended after age 31. Pneumococcal polysaccharide (PPSV23) vaccine. One dose is recommended after age 19. Talk to your health care provider about which screenings and vaccines you need and how often you need them. This information is not intended to replace advice given to you by your health care provider. Make sure you discuss any questions you have with your health care provider. Document Released: 05/03/2015 Document Revised: 12/25/2015 Document Reviewed: 02/05/2015 Elsevier Interactive Patient Education  2017 [place] Prevention in the Home Falls can cause injuries. They can happen to people of all ages. There are many things you can do to make your home safe and to help prevent falls. What can I do on the outside of my home? Regularly fix the edges of walkways and driveways and fix any cracks. Remove anything that might make you trip as you walk through a door, such as a raised step or threshold. Trim any bushes or trees on the path to your home. Use bright outdoor lighting. Clear any walking paths of anything that might make someone trip, such as rocks or tools. Regularly check to see if handrails are loose or broken. Make sure that both sides of any steps have handrails. Any raised decks and porches should have guardrails on the edges. Have any leaves, snow, or ice cleared regularly. Use sand or salt on walking paths during winter. Clean up any spills in your garage right away. This includes oil or grease spills. What can I do in the bathroom? Use night lights. Install grab bars by the toilet and in the tub and shower. Do not use towel bars as grab bars. Use non-skid mats or decals in the tub or shower. If you  need to sit down in the shower, use a plastic, non-slip stool. Keep the floor dry. Clean up any water that spills on the floor as soon as it happens. Remove soap buildup in the tub or shower regularly. Attach bath mats securely with double-sided non-slip rug tape. Do not have throw rugs and other things on the floor that can make you trip. What can I do in the bedroom? Use night lights. Make sure that you have a light by your bed that is easy to reach. Do not use any sheets or blankets that are too big for your bed. They should not hang down onto the floor. Have a firm chair that has side arms. You can use this for support while you get dressed. Do not have throw rugs and other things on the floor that can make you trip. What can I do in the kitchen? Clean up any spills right away. Avoid walking on wet floors. Keep items that you use a lot in easy-to-reach places. If you need to reach something above you, use a strong step stool that has a grab bar. Keep electrical cords out of the way. Do not use floor polish  or wax that makes floors slippery. If you must use wax, use non-skid floor wax. Do not have throw rugs and other things on the floor that can make you trip. What can I do with my stairs? Do not leave any items on the stairs. Make sure that there are handrails on both sides of the stairs and use them. Fix handrails that are broken or loose. Make sure that handrails are as long as the stairways. Check any carpeting to make sure that it is firmly attached to the stairs. Fix any carpet that is loose or worn. Avoid having throw rugs at the top or bottom of the stairs. If you do have throw rugs, attach them to the floor with carpet tape. Make sure that you have a light switch at the top of the stairs and the bottom of the stairs. If you do not have them, ask someone to add them for you. What else can I do to help prevent falls? Wear shoes that: Do not have high heels. Have rubber  bottoms. Are comfortable and fit you well. Are closed at the toe. Do not wear sandals. If you use a stepladder: Make sure that it is fully opened. Do not climb a closed stepladder. Make sure that both sides of the stepladder are locked into place. Ask someone to hold it for you, if possible. Clearly mark and make sure that you can see: Any grab bars or handrails. First and last steps. Where the edge of each step is. Use tools that help you move around (mobility aids) if they are needed. These include: Canes. Walkers. Scooters. Crutches. Turn on the lights when you go into a dark area. Replace any light bulbs as soon as they burn out. Set up your furniture so you have a clear path. Avoid moving your furniture around. If any of your floors are uneven, fix them. If there are any pets around you, be aware of where they are. Review your medicines with your doctor. Some medicines can make you feel dizzy. This can increase your chance of falling. Ask your doctor what other things that you can do to help prevent falls. This information is not intended to replace advice given to you by your health care provider. Make sure you discuss any questions you have with your health care provider. Document Released: 01/31/2009 Document Revised: 09/12/2015 Document Reviewed: 05/11/2014 Elsevier Interactive Patient Education  2017 [employer].

## 2020-11-04 NOTE — Progress Notes (Signed)
Subjective:   Audrey Hamilton is a 75 y.o. female who presents for Medicare Annual (Subsequent) preventive examination.  Review of Systems    No ROS.  Medicare Wellness Virtual Visit.  Visual/audio telehealth visit, UTA vital signs.   See social history for additional risk factors.         Objective:    Today's Vitals   11/04/20 1033  Weight: 169 lb (76.7 kg)  Height: 5\' 4"  (1.626 m)   Body mass index is 29.01 kg/m.  Advanced Directives 11/02/2019 10/31/2018 07/04/2018 10/27/2017 07/22/2015  Does Patient Have a Medical Advance Directive? Yes Yes Yes Yes Yes  Type of Paramedic of [place];Living will Healthcare Power of [place];Living will [place];Living will [place];Living will  Does patient want to make changes to medical advance directive? No - Patient declined No - Patient declined - No - Patient declined No - Patient declined  Copy of [place] in Chart? No - copy requested No - copy requested Yes - validated most recent copy scanned in chart (See row information) No - copy requested No - copy requested    Current Medications (verified) Outpatient Encounter Medications as of 11/04/2020  Medication Sig   amLODipine (NORVASC) 10 MG tablet TAKE 1 TABLET BY MOUTH EVERY DAY   atorvastatin (LIPITOR) 80 MG tablet TAKE 1 TABLET BY MOUTH EVERY DAY   escitalopram (LEXAPRO) 10 MG tablet Take 1 tablet by mouth daily.   Ferrous Fum-Iron Polysacch (TANDEM PO) Take 106 mg by mouth daily.   fluticasone (FLONASE) 50 MCG/ACT nasal spray Place into the nose at bedtime. Reported on 10/01/2015   hydrochlorothiazide (MICROZIDE) 12.5 MG capsule Take 1 capsule (12.5 mg total) by mouth daily.   leflunomide (ARAVA) 10 MG tablet 1 tab daily x 90 days   loratadine (CLARITIN) 10 MG tablet TAKE 1 TABLET BY MOUTH EVERY DAY   LORazepam (ATIVAN) 1 MG tablet TAKE 1 TABLET BY MOUTH EVERYDAY AT  BEDTIME   montelukast (SINGULAIR) 10 MG tablet TAKE 1 TABLET BY MOUTH EVERYDAY AT BEDTIME   omega-3 acid ethyl esters (LOVAZA) 1 g capsule Take 2 capsules (2 g total) by mouth 2 (two) times daily.   omeprazole (PRILOSEC) 20 MG capsule Take by mouth.    potassium chloride SA (K-DUR,KLOR-CON) 20 MEQ tablet Take 1 tablet (20 mEq total) by mouth daily.   telmisartan (MICARDIS) 80 MG tablet Take 1 tablet (80 mg total) by mouth daily.   traMADol (ULTRAM) 50 MG tablet TAKE 1 TABLET BY MOUTH EVERY 8 HOURS AS NEEDED   No facility-administered encounter medications on file as of 11/04/2020.    Allergies (verified) Amoxicillin-pot clavulanate, Aspirin, Baclofen, Fluconazole, Oxaprozin, and Sulfa antibiotics   History: Past Medical History:  Diagnosis Date   Allergy    Anxiety    controlled   Arthritis    Chicken pox    Colon polyps    Depression    Fibromyalgia    History of hypothyroidism    Hyperlipidemia    Hypokalemia    Multiple gastric ulcers    Rheumatoid arthritis ([place]) Since 1995   currently in a flare up   Past Surgical History:  Procedure Laterality Date   [place] Bilateral [id number]   silicone   COLONOSCOPY WITH PROPOFOL N/A 07/04/2018   Procedure: COLONOSCOPY WITH PROPOFOL;  Surgeon: Manya Silvas, MD;  Location: [facility] ENDOSCOPY;  Service: Endoscopy;  Laterality:  N/A;   EYE SURGERY     FOOT SURGERY Left 11/22/10   MASTECTOMY Bilateral 1970's   SUBCUTANEOUS MASTECTOMY - NO CANCER   SMALL INTESTINE SURGERY  12/2001   STOMACH SURGERY  12/2001   TONSILLECTOMY AND ADENOIDECTOMY  1966   UPPER GASTROINTESTINAL ENDOSCOPY     Family History  Problem Relation Age of Onset   Arthritis Mother    Sudden death Mother        after birth of pt who was 60 days old.   Depression Mother    Arthritis Maternal Grandmother    Cancer Maternal Grandmother    Hyperlipidemia Maternal Grandfather    Diabetes Mellitus II Father    Liver  disease Father    Rheum arthritis Son    Stroke Son    Breast cancer Neg Hx    Social History   Socioeconomic History   Marital status: Widowed    Spouse name: Not on file   Number of children: Not on file   Years of education: Not on file   Highest education level: Not on file  Occupational History   Not on file  Tobacco Use   Smoking status: Former    Types: Cigarettes    Quit date: 06/19/1995    Years since quitting: 25.3   Smokeless tobacco: Never  Vaping Use   Vaping Use: Never used  Substance and Sexual Activity   Alcohol use: Yes    Alcohol/week: 0.0 - 1.0 standard drinks    Comment: occ   Drug use: No   Sexual activity: Not on file  Other Topics Concern   Not on file  Social History Narrative   Not on file   Social Determinants of Health   Financial Resource Strain: Not on file  Food Insecurity: No Food Insecurity   Worried About Running Out of Food in the Last Year: Never true   Ran Out of Food in the Last Year: Never true  Transportation Needs: No Transportation Needs   Lack of Transportation (Medical): No   Lack of Transportation (Non-Medical): No  Physical Activity: Not on file  Stress: Not on file  Social Connections: Not on file    Tobacco Counseling Counseling given: Not Answered   Clinical Intake:  Pre-visit preparation completed: Yes        Diabetes: No  How often do you need to have someone help you when you read instructions, pamphlets, or other written materials from your doctor or pharmacy?: 1 - Never   Interpreter Needed?: No      Activities of Daily Living No flowsheet data found.  Patient Care Team: Leone Haven, MD as PCP - General (Family Medicine)  Indicate any recent Medical Services you may have received from other than [facility] providers in the past year (date may be approximate).     Assessment:   This is a routine wellness examination for Audrey Hamilton.  I connected with Vicki today by telephone and verified  that I am speaking with the correct person using two identifiers. Location patient: home Location provider: work Persons participating in the virtual visit: patient, Marine scientist.    I discussed the limitations, risks, security and privacy concerns of performing an evaluation and management service by telephone and the availability of in person appointments. The patient expressed understanding and verbally consented to this telephonic visit.    Interactive audio and video telecommunications were attempted between this provider and patient, however failed, due to patient having technical difficulties OR patient did not  have access to video capability.  We continued and completed visit with audio only.  Some vital signs may be absent or patient reported.   Hearing/Vision screen No results found.  Dietary issues and exercise activities discussed:   Healthy diet Good water intake   Goals Addressed               This Visit's Progress     Patient Stated     Maintain healthy lifestyle (pt-stated)        Stay active Healthy diet       Depression Screen PHQ 2/9 Scores 11/04/2020 07/15/2020 05/13/2020 01/09/2020 11/02/2019 07/31/2019 05/05/2019  PHQ - 2 Score 0 0 0 0 0 0 0    Fall Risk Fall Risk  07/15/2020 05/13/2020 01/09/2020 11/02/2019 07/31/2019  Falls in the past year? 0 0 0 0 0  Number falls in past yr: 0 0 0 0 0  Injury with Fall? - 0 - - -  Follow up Falls evaluation completed Falls evaluation completed Falls evaluation completed Falls evaluation completed Falls evaluation completed    [place]: Adequate lighting in your home to reduce risk of falls? Yes   ASSISTIVE DEVICES UTILIZED TO PREVENT FALLS: Life alert? Yes  Use of a cane, walker or w/c? No   TIMED UP AND GO: Was the test performed? No .   Cognitive Function: Patient is alert and oriented x3.  Enjoys brain  MMSE/6CIT deferred. Normal by direct communication/observation. MMSE - Mini  Mental State Exam 10/27/2017  Orientation to time 5  Orientation to Place 5  Registration 3  Attention/ Calculation 5  Recall 3  Language- name 2 objects 2  Language- repeat 1  Language- follow 3 step command 3  Language- read & follow direction 1  Write a sentence 1  Copy design 1  Total score 30     6CIT Screen 10/31/2018  What Year? 0 points  What month? 0 points  What time? 0 points  Count back from 20 0 points  Months in reverse 0 points  Repeat phrase 0 points  Total Score 0    Immunizations Immunization History  Administered Date(s) Administered   Influenza, High Dose Seasonal PF 12/12/2016, 01/17/2018, 12/03/2018, 12/03/2018, 12/27/2019   Influenza-Unspecified 12/20/2014, 01/02/2016   PFIZER Comirnaty(Gray Top)Covid-19 Tri-Sucrose Vaccine 09/18/2020   PFIZER(Purple Top)SARS-COV-2 Vaccination 05/25/2019, 06/24/2019, 02/26/2020   Pneumococcal Polysaccharide-23 12/03/2018   Tdap 11/02/2018   Zoster, Live 05/09/1997    Shingles vaccine- considering Shingrix. Will discuss more with pcp. Deferred to next scheduled appointment.   Health Maintenance Health Maintenance  Topic Date Due   Zoster Vaccines- Shingrix (1 of 2) 02/04/2021 (Originally 05/30/1964)   INFLUENZA VACCINE  11/18/2020   COVID-19 Vaccine (5 - Booster for Pfizer series) 01/18/2021   COLONOSCOPY (Pts 45-9yrs Insurance coverage will need to be confirmed)  07/03/2021   MAMMOGRAM  06/07/2022   TETANUS/TDAP  11/01/2028   DEXA SCAN  Completed   Hepatitis C Screening  Completed   PNA vac Low Risk Adult  Addressed   HPV VACCINES  Aged Out   Lung Cancer Screening: (Low Dose CT Chest recommended if Age 3-80 years, 30 pack-year currently smoking OR have quit w/in 15years.) does not qualify.   Vision Screening: Recommended annual ophthalmology exams for early detection of glaucoma and other disorders of the eye. Is the patient up to date with their annual eye exam?  Yes   Dental Screening: Recommended  annual dental exams for proper oral  hygiene  Community Resource Referral / Chronic Care Management: CRR required this visit?  No   CCM required this visit?  No      Plan:   I have personally reviewed and noted the following in the patient's chart:   Medical and social history Use of alcohol, tobacco or illicit drugs  Current medications and supplements including opioid prescriptions. Patient is not currently taking opioid. Functional ability and status Nutritional status Physical activity Advanced directives List of other physicians Hospitalizations, surgeries, and ER visits in previous 12 months Vitals Screenings to include cognitive, depression, and falls Referrals and appointments  In addition, I have reviewed and discussed with patient certain preventive protocols, quality metrics, and best practice recommendations. A written personalized care plan for preventive services as well as general preventive health recommendations were provided to patient.     Varney Biles, LPN   [id number]/54/4920

## 2020-11-06 ENCOUNTER — Other Ambulatory Visit: Payer: Self-pay | Admitting: Family Medicine

## 2020-11-10 ENCOUNTER — Other Ambulatory Visit: Payer: Self-pay | Admitting: Family Medicine

## 2020-11-10 DIAGNOSIS — E782 Mixed hyperlipidemia: Secondary | ICD-10-CM

## 2020-11-27 DIAGNOSIS — M62830 Muscle spasm of back: Secondary | ICD-10-CM | POA: Diagnosis not present

## 2020-11-27 DIAGNOSIS — M9903 Segmental and somatic dysfunction of lumbar region: Secondary | ICD-10-CM | POA: Diagnosis not present

## 2020-11-27 DIAGNOSIS — M5136 Other intervertebral disc degeneration, lumbar region: Secondary | ICD-10-CM | POA: Diagnosis not present

## 2020-11-27 DIAGNOSIS — M9905 Segmental and somatic dysfunction of pelvic region: Secondary | ICD-10-CM | POA: Diagnosis not present

## 2020-11-28 ENCOUNTER — Other Ambulatory Visit: Payer: Self-pay

## 2020-11-28 ENCOUNTER — Other Ambulatory Visit (INDEPENDENT_AMBULATORY_CARE_PROVIDER_SITE_OTHER): Payer: Medicare Other

## 2020-11-28 DIAGNOSIS — I10 Essential (primary) hypertension: Secondary | ICD-10-CM | POA: Diagnosis not present

## 2020-11-28 LAB — BASIC METABOLIC PANEL
BUN: 15 mg/dL (ref 6–23)
CO2: 32 mEq/L (ref 19–32)
Calcium: 10.3 mg/dL (ref 8.4–10.5)
Chloride: 98 mEq/L (ref 96–112)
Creatinine, Ser: 0.7 mg/dL (ref 0.40–1.20)
GFR: 85.15 mL/min (ref >60.00)
Glucose, Bld: 104 mg/dL — ABNORMAL HIGH (ref 70–99)
Potassium: 4.5 mEq/L (ref 3.5–5.1)
Sodium: 138 mEq/L (ref 135–145)

## 2020-11-29 ENCOUNTER — Other Ambulatory Visit: Payer: Self-pay | Admitting: Family Medicine

## 2020-12-10 DIAGNOSIS — M1712 Unilateral primary osteoarthritis, left knee: Secondary | ICD-10-CM | POA: Diagnosis not present

## 2020-12-10 DIAGNOSIS — M1711 Unilateral primary osteoarthritis, right knee: Secondary | ICD-10-CM | POA: Diagnosis not present

## 2020-12-11 DIAGNOSIS — M0609 Rheumatoid arthritis without rheumatoid factor, multiple sites: Secondary | ICD-10-CM | POA: Diagnosis not present

## 2020-12-11 DIAGNOSIS — M170 Bilateral primary osteoarthritis of knee: Secondary | ICD-10-CM | POA: Diagnosis not present

## 2020-12-11 DIAGNOSIS — Z79899 Other long term (current) drug therapy: Secondary | ICD-10-CM | POA: Diagnosis not present

## 2020-12-16 DIAGNOSIS — Z23 Encounter for immunization: Secondary | ICD-10-CM | POA: Diagnosis not present

## 2020-12-20 DIAGNOSIS — M170 Bilateral primary osteoarthritis of knee: Secondary | ICD-10-CM | POA: Diagnosis not present

## 2020-12-20 DIAGNOSIS — M0609 Rheumatoid arthritis without rheumatoid factor, multiple sites: Secondary | ICD-10-CM | POA: Diagnosis not present

## 2020-12-25 DIAGNOSIS — M62830 Muscle spasm of back: Secondary | ICD-10-CM | POA: Diagnosis not present

## 2020-12-25 DIAGNOSIS — M9905 Segmental and somatic dysfunction of pelvic region: Secondary | ICD-10-CM | POA: Diagnosis not present

## 2020-12-25 DIAGNOSIS — M9903 Segmental and somatic dysfunction of lumbar region: Secondary | ICD-10-CM | POA: Diagnosis not present

## 2020-12-25 DIAGNOSIS — M5136 Other intervertebral disc degeneration, lumbar region: Secondary | ICD-10-CM | POA: Diagnosis not present

## 2021-01-07 ENCOUNTER — Other Ambulatory Visit: Payer: Self-pay | Admitting: Family Medicine

## 2021-01-07 ENCOUNTER — Other Ambulatory Visit: Payer: Self-pay

## 2021-01-07 ENCOUNTER — Encounter: Payer: Self-pay | Admitting: Family Medicine

## 2021-01-07 ENCOUNTER — Ambulatory Visit (INDEPENDENT_AMBULATORY_CARE_PROVIDER_SITE_OTHER): Payer: Medicare Other | Admitting: Family Medicine

## 2021-01-07 DIAGNOSIS — I10 Essential (primary) hypertension: Secondary | ICD-10-CM

## 2021-01-07 DIAGNOSIS — F4321 Adjustment disorder with depressed mood: Secondary | ICD-10-CM

## 2021-01-07 DIAGNOSIS — E782 Mixed hyperlipidemia: Secondary | ICD-10-CM

## 2021-01-07 DIAGNOSIS — M069 Rheumatoid arthritis, unspecified: Secondary | ICD-10-CM

## 2021-01-07 DIAGNOSIS — G4700 Insomnia, unspecified: Secondary | ICD-10-CM

## 2021-01-07 NOTE — Assessment & Plan Note (Signed)
Adequately controlled.  She will continue amlodipine 10 mg once daily, HCTZ 12.5 mg daily, unit telmisartan 80 mg daily.

## 2021-01-07 NOTE — Assessment & Plan Note (Signed)
Worsened recently with the loss of her son.  She will monitor on her current dosing of Ativan 1 mg nightly.

## 2021-01-07 NOTE — Progress Notes (Signed)
Tommi Rumps, MD Phone: [phone]  Audrey Hamilton is a 75 y.o. female who presents today for f/u.  HYPERTENSION Disease Monitoring Home BP Monitoring 098-119 systolic Chest pain- no    Dyspnea- no Medications Compliance-  taking amlodipine, HCTZ, telmisartan.  Edema- only when her rheumatologist changed her RA meds, resolved once she was on her new medication BMET    Component Value Date/Time   NA 138 11/28/2020 1104   NA 141 05/12/2019 1440   K 4.5 11/28/2020 1104   CL 98 11/28/2020 1104   CO2 32 11/28/2020 1104   GLUCOSE 104 (H) 11/28/2020 1104   BUN 15 11/28/2020 1104   BUN 13 05/12/2019 1440   CREATININE 0.70 11/28/2020 1104   CALCIUM 10.3 11/28/2020 1104   GFRNONAA 101 05/12/2019 1440   GFRAA 117 05/12/2019 1440   Insomnia: Patient notes this has not been as good.  Her son recently passed away and she has only been able to get 4 and a half hours of sleep at night.  She does take the Ativan with some benefit.  No drowsiness with the Ativan.  Grief: Patient notes her son passed away recently.  He had a stroke about 16 years ago.  She notes lots of grief and does have support at home.  No anxiety.  Rheumatoid arthritis: Patient continues to follow with rheumatology.  She continues on tramadol for her discomfort.  This does not make her drowsy.  She is now on Plaquenil for her rheumatoid arthritis  Social History   Tobacco Use  Smoking Status Former  . Types: Cigarettes  . Quit date: 06/19/1995  . Years since quitting: 25.5  Smokeless Tobacco Never    Current Outpatient Medications on File Prior to Visit  Medication Sig Dispense Refill  . amLODipine (NORVASC) 10 MG tablet TAKE 1 TABLET BY MOUTH EVERY DAY 90 tablet 1  . atorvastatin (LIPITOR) 80 MG tablet TAKE 1 TABLET BY MOUTH EVERY DAY 90 tablet 1  . cyclobenzaprine (FLEXERIL) 5 MG tablet Take 5 mg by mouth at bedtime as needed.    Marland Kitchen escitalopram (LEXAPRO) 10 MG tablet Take 1 tablet by mouth daily.    .  Ferrous Fum-Iron Polysacch (TANDEM PO) Take 106 mg by mouth daily.    . fluticasone (FLONASE) 50 MCG/ACT nasal spray Place into the nose at bedtime. Reported on 10/01/2015    . hydrochlorothiazide (MICROZIDE) 12.5 MG capsule Take 1 capsule (12.5 mg total) by mouth daily. 90 capsule 1  . hydroxychloroquine (PLAQUENIL) 200 MG tablet Take 200 mg by mouth 2 (two) times daily.    Marland Kitchen loratadine (CLARITIN) 10 MG tablet TAKE 1 TABLET BY MOUTH EVERY DAY 90 tablet 3  . LORazepam (ATIVAN) 1 MG tablet TAKE 1 TABLET BY MOUTH EVERYDAY AT BEDTIME 30 tablet 1  . montelukast (SINGULAIR) 10 MG tablet TAKE 1 TABLET BY MOUTH EVERYDAY AT BEDTIME 90 tablet 0  . omega-3 acid ethyl esters (LOVAZA) 1 g capsule TAKE 2 CAPSULES BY MOUTH 2 TIMES DAILY. 360 capsule 1  . omeprazole (PRILOSEC) 20 MG capsule Take by mouth.     . potassium chloride SA (K-DUR,KLOR-CON) 20 MEQ tablet Take 1 tablet (20 mEq total) by mouth daily. 6 tablet 0  . telmisartan (MICARDIS) 80 MG tablet Take 1 tablet (80 mg total) by mouth daily. 90 tablet 1  . traMADol (ULTRAM) 50 MG tablet TAKE 1 TABLET BY MOUTH EVERY 8 HOURS AS NEEDED 90 tablet 1   No current facility-administered medications on file prior to visit.  ROS see history of present illness  Objective  Physical Exam Vitals:   01/07/21 1055  BP: 125/80  Pulse: 89  Temp: 98.6 F (37 C)  SpO2: 97%    BP Readings from Last 3 Encounters:  01/07/21 125/80  10/31/20 136/76  10/01/20 140/80   Wt Readings from Last 3 Encounters:  01/07/21 171 lb 12.8 oz (77.9 kg)  11/04/20 169 lb (76.7 kg)  10/31/20 169 lb 9.6 oz (76.9 kg)    Physical Exam Constitutional:      General: She is not in acute distress.    Appearance: She is not diaphoretic.  Cardiovascular:     Rate and Rhythm: Normal rate and regular rhythm.     Heart sounds: Normal heart sounds.  Pulmonary:     Effort: Pulmonary effort is normal.     Breath sounds: Normal breath sounds.  Musculoskeletal:     Right lower  leg: No edema.     Left lower leg: No edema.  Skin:    General: Skin is warm and dry.  Neurological:     Mental Status: She is alert.     Assessment/Plan: Please see individual problem list.  Problem List Items Addressed This Visit     Essential hypertension    Adequately controlled.  She will continue amlodipine 10 mg once daily, HCTZ 12.5 mg daily, unit telmisartan 80 mg daily.      Grief    Related to the loss of her son.  I offered support.  She does not want to do anything else about this at this time other than grieving in her own way.  Encouraged her to lean on her support at home.  I also discussed that we could have her see a counselor or do hospice grief counseling if she desired in the future.      Insomnia    Worsened recently with the loss of her son.  She will monitor on her current dosing of Ativan 1 mg nightly.      Rheumatoid arthritis ([place])    Stable.  She will continue tramadol 50 mg twice daily as needed for pain.  She will continue to see her rheumatologist.      Relevant Medications   cyclobenzaprine (FLEXERIL) 5 MG tablet   hydroxychloroquine (PLAQUENIL) 200 MG tablet    Return in about 3 months (around 04/08/2021) for Insomnia/pain management.  This visit occurred during the SARS-CoV-2 public health emergency.  Safety protocols were in place, including screening questions prior to the visit, additional usage of staff PPE, and extensive cleaning of exam room while observing appropriate contact time as indicated for disinfecting solutions.    Tommi Rumps, MD [place]

## 2021-01-07 NOTE — Patient Instructions (Signed)
Nice to see you. Please let us know if you need anything regarding your grief.  We can always get you to see a therapist or you can contact hospice grief counseling. Please continue to monitor your blood pressure.

## 2021-01-07 NOTE — Assessment & Plan Note (Signed)
Related to the loss of her son.  I offered support.  She does not want to do anything else about this at this time other than grieving in her own way.  Encouraged her to lean on her support at home.  I also discussed that we could have her see a counselor or do hospice grief counseling if she desired in the future.

## 2021-01-07 NOTE — Assessment & Plan Note (Signed)
Stable.  She will continue tramadol 50 mg twice daily as needed for pain.  She will continue to see her rheumatologist.

## 2021-01-14 ENCOUNTER — Other Ambulatory Visit: Payer: Self-pay | Admitting: Family Medicine

## 2021-01-27 ENCOUNTER — Other Ambulatory Visit: Payer: Self-pay | Admitting: Family Medicine

## 2021-01-29 DIAGNOSIS — M62830 Muscle spasm of back: Secondary | ICD-10-CM | POA: Diagnosis not present

## 2021-01-29 DIAGNOSIS — M5136 Other intervertebral disc degeneration, lumbar region: Secondary | ICD-10-CM | POA: Diagnosis not present

## 2021-01-29 DIAGNOSIS — M9905 Segmental and somatic dysfunction of pelvic region: Secondary | ICD-10-CM | POA: Diagnosis not present

## 2021-01-29 DIAGNOSIS — M9903 Segmental and somatic dysfunction of lumbar region: Secondary | ICD-10-CM | POA: Diagnosis not present

## 2021-01-29 NOTE — Telephone Encounter (Signed)
RX Refill:ativan Last Seen:01-07-21 Last ordered:11-29-20

## 2021-02-18 ENCOUNTER — Other Ambulatory Visit: Payer: Self-pay | Admitting: Family Medicine

## 2021-02-18 DIAGNOSIS — I10 Essential (primary) hypertension: Secondary | ICD-10-CM

## 2021-02-26 DIAGNOSIS — M9905 Segmental and somatic dysfunction of pelvic region: Secondary | ICD-10-CM | POA: Diagnosis not present

## 2021-02-26 DIAGNOSIS — M62830 Muscle spasm of back: Secondary | ICD-10-CM | POA: Diagnosis not present

## 2021-02-26 DIAGNOSIS — M9903 Segmental and somatic dysfunction of lumbar region: Secondary | ICD-10-CM | POA: Diagnosis not present

## 2021-02-26 DIAGNOSIS — M5136 Other intervertebral disc degeneration, lumbar region: Secondary | ICD-10-CM | POA: Diagnosis not present

## 2021-03-19 ENCOUNTER — Other Ambulatory Visit: Payer: Self-pay | Admitting: Family Medicine

## 2021-03-19 DIAGNOSIS — I10 Essential (primary) hypertension: Secondary | ICD-10-CM

## 2021-03-26 ENCOUNTER — Other Ambulatory Visit: Payer: Self-pay | Admitting: Family Medicine

## 2021-03-26 DIAGNOSIS — M9903 Segmental and somatic dysfunction of lumbar region: Secondary | ICD-10-CM | POA: Diagnosis not present

## 2021-03-26 DIAGNOSIS — M62830 Muscle spasm of back: Secondary | ICD-10-CM | POA: Diagnosis not present

## 2021-03-26 DIAGNOSIS — M9905 Segmental and somatic dysfunction of pelvic region: Secondary | ICD-10-CM | POA: Diagnosis not present

## 2021-03-26 DIAGNOSIS — M5136 Other intervertebral disc degeneration, lumbar region: Secondary | ICD-10-CM | POA: Diagnosis not present

## 2021-03-26 NOTE — Telephone Encounter (Signed)
RX Refill: ativan and tramadol Last Seen: 01-07-21 Last Ordered: 01-29-21 & 01-15-21 Next Appt: 04-08-21

## 2021-04-08 ENCOUNTER — Encounter: Payer: Self-pay | Admitting: Family Medicine

## 2021-04-08 ENCOUNTER — Ambulatory Visit (INDEPENDENT_AMBULATORY_CARE_PROVIDER_SITE_OTHER): Payer: Medicare Other | Admitting: Family Medicine

## 2021-04-08 ENCOUNTER — Other Ambulatory Visit: Payer: Self-pay

## 2021-04-08 VITALS — BP 130/70 | HR 80 | Temp 97.7°F | Ht 64.0 in | Wt 172.2 lb

## 2021-04-08 DIAGNOSIS — D509 Iron deficiency anemia, unspecified: Secondary | ICD-10-CM | POA: Diagnosis not present

## 2021-04-08 DIAGNOSIS — E785 Hyperlipidemia, unspecified: Secondary | ICD-10-CM | POA: Diagnosis not present

## 2021-04-08 DIAGNOSIS — R7303 Prediabetes: Secondary | ICD-10-CM | POA: Diagnosis not present

## 2021-04-08 DIAGNOSIS — Z8639 Personal history of other endocrine, nutritional and metabolic disease: Secondary | ICD-10-CM | POA: Diagnosis not present

## 2021-04-08 DIAGNOSIS — F4321 Adjustment disorder with depressed mood: Secondary | ICD-10-CM | POA: Diagnosis not present

## 2021-04-08 DIAGNOSIS — M069 Rheumatoid arthritis, unspecified: Secondary | ICD-10-CM

## 2021-04-08 DIAGNOSIS — G4700 Insomnia, unspecified: Secondary | ICD-10-CM

## 2021-04-08 LAB — CBC
HCT: 46.6 % — ABNORMAL HIGH (ref 36.0–46.0)
Hemoglobin: 15.4 g/dL — ABNORMAL HIGH (ref 12.0–15.0)
MCHC: 33.2 g/dL (ref 30.0–36.0)
MCV: 95.1 fl (ref 78.0–100.0)
Platelets: 270 10*3/uL (ref 150.0–400.0)
RBC: 4.9 Mil/uL (ref 3.87–5.11)
RDW: 14 % (ref 11.5–15.5)
WBC: 9.8 10*3/uL (ref 4.0–10.5)

## 2021-04-08 LAB — IBC + FERRITIN
Ferritin: 23.5 ng/mL (ref 10.0–291.0)
Iron: 78 ug/dL (ref 42–145)
Saturation Ratios: 19.1 % — ABNORMAL LOW (ref 20.0–50.0)
TIBC: 407.4 ug/dL (ref 250.0–450.0)
Transferrin: 291 mg/dL (ref 212.0–360.0)

## 2021-04-08 NOTE — Assessment & Plan Note (Signed)
Plan to check this at her next visit.  She will continue on Lipitor 80 mg once daily.

## 2021-04-08 NOTE — Assessment & Plan Note (Signed)
Stable.  She can continue tramadol 50 mg twice daily as needed for pain.  She will continue to see rheumatology.  They manage her rheumatologic medications.

## 2021-04-08 NOTE — Assessment & Plan Note (Signed)
Stable.  She can continue lorazepam 1 mg nightly as needed.

## 2021-04-08 NOTE — Progress Notes (Signed)
Tommi Rumps, MD Phone: [phone]  Audrey Hamilton is a 75 y.o. female who presents today for f/u.  HYPERLIPIDEMIA Symptoms Chest pain on exertion:  no   Leg claudication:   no Medications: Compliance- taking lipitor Right upper quadrant pain- no  Muscle aches- no change to chronic myalgias from fibromyalgia and RA Lipid Panel     Component Value Date/Time   CHOL 173 05/13/2020 1003   TRIG 381.0 (H) 05/13/2020 1003   HDL 41.50 05/13/2020 1003   CHOLHDL 4 05/13/2020 1003   VLDL 76.2 (H) 05/13/2020 1003   LDLCALC 92 05/14/2015 1311   LDLDIRECT 82.0 05/13/2020 1003   Insomnia: Patient notes the lorazepam is beneficial.  She takes it at night.  She notes no drowsiness.  She gets 4 to 5 hours sleep.  No napping during the day.  No alcohol intake.  Grief: Patient notes this is significantly better.  She notes no anxiety or depression.  She is on Lexapro.  Rheumatoid arthritis: She continues on tramadol twice daily for pain.  Notes it helps take the edge off.  No side effects.  She continues on her rheumatologic medicines through rheumatology.  Iron deficiency anemia: Has a history of this.  She had an EGD and colonoscopy in 2017.  She is still on an iron supplement.  She notes no bleeding.  Social History   Tobacco Use  Smoking Status Former   Types: Cigarettes   Quit date: 06/19/1995   Years since quitting: 25.8  Smokeless Tobacco Never    Current Outpatient Medications on File Prior to Visit  Medication Sig Dispense Refill   amLODipine (NORVASC) 10 MG tablet TAKE 1 TABLET BY MOUTH EVERY DAY 90 tablet 1   atorvastatin (LIPITOR) 80 MG tablet TAKE 1 TABLET BY MOUTH EVERY DAY 90 tablet 1   cyclobenzaprine (FLEXERIL) 5 MG tablet Take 5 mg by mouth at bedtime as needed.     escitalopram (LEXAPRO) 10 MG tablet Take 1 tablet by mouth daily.     Ferrous Fum-Iron Polysacch (TANDEM PO) Take 106 mg by mouth daily.     fluticasone (FLONASE) 50 MCG/ACT nasal spray Place into the  nose at bedtime. Reported on 10/01/2015     hydrochlorothiazide (MICROZIDE) 12.5 MG capsule TAKE 1 CAPSULE BY MOUTH EVERY DAY 90 capsule 1   hydroxychloroquine (PLAQUENIL) 200 MG tablet Take 200 mg by mouth 2 (two) times daily.     loratadine (CLARITIN) 10 MG tablet TAKE 1 TABLET BY MOUTH EVERY DAY 90 tablet 3   LORazepam (ATIVAN) 1 MG tablet TAKE 1 TABLET BY MOUTH EVERYDAY AT BEDTIME 30 tablet 1   montelukast (SINGULAIR) 10 MG tablet TAKE 1 TABLET BY MOUTH EVERYDAY AT BEDTIME 90 tablet 0   omega-3 acid ethyl esters (LOVAZA) 1 g capsule TAKE 2 CAPSULES BY MOUTH 2 TIMES DAILY. 360 capsule 1   omeprazole (PRILOSEC) 20 MG capsule Take by mouth.      potassium chloride SA (K-DUR,KLOR-CON) 20 MEQ tablet Take 1 tablet (20 mEq total) by mouth daily. 6 tablet 0   telmisartan (MICARDIS) 80 MG tablet Take 1 tablet (80 mg total) by mouth daily. 90 tablet 1   traMADol (ULTRAM) 50 MG tablet TAKE 1 TABLET BY MOUTH EVERY 8 HOURS AS NEEDED 90 tablet 1   No current facility-administered medications on file prior to visit.     ROS see history of present illness  Objective  Physical Exam Vitals:   04/08/21 1012  BP: 130/70  Pulse: 80  Temp: 97.7  F (36.5 C)  SpO2: 98%    BP Readings from Last 3 Encounters:  04/08/21 130/70  01/07/21 125/80  10/31/20 136/76   Wt Readings from Last 3 Encounters:  04/08/21 172 lb 3.2 oz (78.1 kg)  01/07/21 171 lb 12.8 oz (77.9 kg)  11/04/20 169 lb (76.7 kg)    Physical Exam Constitutional:      General: She is not in acute distress.    Appearance: She is not diaphoretic.  Cardiovascular:     Rate and Rhythm: Normal rate and regular rhythm.     Heart sounds: Normal heart sounds.  Pulmonary:     Effort: Pulmonary effort is normal.     Breath sounds: Normal breath sounds.  Skin:    General: Skin is warm and dry.  Neurological:     Mental Status: She is alert.     Assessment/Plan: Please see individual problem list.  Problem List Items Addressed This  Visit     Grief    Much improved.  She denies any depression or anxiety.      History of hypothyroidism   Relevant Orders   TSH   HLD (hyperlipidemia)    Plan to check this at her next visit.  She will continue on Lipitor 80 mg once daily.      Relevant Orders   Comp Met (CMET)   Lipid panel   Insomnia    Stable.  She can continue lorazepam 1 mg nightly as needed.      Iron deficiency anemia - Primary    Check labs today.  She will continue her iron supplement.  Discussed the potential for coming off of this to see if she becomes iron deficient again.      Relevant Orders   CBC   IBC + Ferritin   Prediabetes   Relevant Orders   HgB A1c   Rheumatoid arthritis ([place])    Stable.  She can continue tramadol 50 mg twice daily as needed for pain.  She will continue to see rheumatology.  They manage her rheumatologic medications.        Return in about 3 months (around 07/07/2021) for insomnia, RA pain, labs 2 days prior.  This visit occurred during the SARS-CoV-2 public health emergency.  Safety protocols were in place, including screening questions prior to the visit, additional usage of staff PPE, and extensive cleaning of exam room while observing appropriate contact time as indicated for disinfecting solutions.    Tommi Rumps, MD [place]

## 2021-04-08 NOTE — Assessment & Plan Note (Signed)
Check labs today.  She will continue her iron supplement.  Discussed the potential for coming off of this to see if she becomes iron deficient again.

## 2021-04-08 NOTE — Assessment & Plan Note (Signed)
Much improved.  She denies any depression or anxiety.

## 2021-04-08 NOTE — Patient Instructions (Signed)
Nice to see you. We will get lab work today and contact you with the results. If you need refills please let me know.

## 2021-04-10 ENCOUNTER — Other Ambulatory Visit: Payer: Self-pay | Admitting: Family Medicine

## 2021-04-10 DIAGNOSIS — D582 Other hemoglobinopathies: Secondary | ICD-10-CM

## 2021-04-15 DIAGNOSIS — M7918 Myalgia, other site: Secondary | ICD-10-CM | POA: Diagnosis not present

## 2021-04-15 DIAGNOSIS — M0609 Rheumatoid arthritis without rheumatoid factor, multiple sites: Secondary | ICD-10-CM | POA: Diagnosis not present

## 2021-04-15 DIAGNOSIS — M170 Bilateral primary osteoarthritis of knee: Secondary | ICD-10-CM | POA: Diagnosis not present

## 2021-04-15 DIAGNOSIS — Z79899 Other long term (current) drug therapy: Secondary | ICD-10-CM | POA: Diagnosis not present

## 2021-04-23 DIAGNOSIS — M5136 Other intervertebral disc degeneration, lumbar region: Secondary | ICD-10-CM | POA: Diagnosis not present

## 2021-04-23 DIAGNOSIS — M9903 Segmental and somatic dysfunction of lumbar region: Secondary | ICD-10-CM | POA: Diagnosis not present

## 2021-04-23 DIAGNOSIS — M62830 Muscle spasm of back: Secondary | ICD-10-CM | POA: Diagnosis not present

## 2021-04-23 DIAGNOSIS — M9905 Segmental and somatic dysfunction of pelvic region: Secondary | ICD-10-CM | POA: Diagnosis not present

## 2021-04-24 ENCOUNTER — Other Ambulatory Visit (INDEPENDENT_AMBULATORY_CARE_PROVIDER_SITE_OTHER): Payer: Medicare Other

## 2021-04-24 ENCOUNTER — Other Ambulatory Visit: Payer: Self-pay

## 2021-04-24 DIAGNOSIS — D582 Other hemoglobinopathies: Secondary | ICD-10-CM

## 2021-04-24 LAB — CBC
HCT: 43.9 % (ref 36.0–46.0)
Hemoglobin: 14.7 g/dL (ref 12.0–15.0)
MCHC: 33.5 g/dL (ref 30.0–36.0)
MCV: 94.8 fl (ref 78.0–100.0)
Platelets: 239 10*3/uL (ref 150.0–400.0)
RBC: 4.64 Mil/uL (ref 3.87–5.11)
RDW: 13.3 % (ref 11.5–15.5)
WBC: 9.8 10*3/uL (ref 4.0–10.5)

## 2021-05-05 ENCOUNTER — Other Ambulatory Visit: Payer: Self-pay | Admitting: Family Medicine

## 2021-05-05 DIAGNOSIS — E782 Mixed hyperlipidemia: Secondary | ICD-10-CM

## 2021-05-26 ENCOUNTER — Other Ambulatory Visit: Payer: Self-pay | Admitting: Family Medicine

## 2021-05-28 DIAGNOSIS — M9905 Segmental and somatic dysfunction of pelvic region: Secondary | ICD-10-CM | POA: Diagnosis not present

## 2021-05-28 DIAGNOSIS — M5136 Other intervertebral disc degeneration, lumbar region: Secondary | ICD-10-CM | POA: Diagnosis not present

## 2021-05-28 DIAGNOSIS — M62830 Muscle spasm of back: Secondary | ICD-10-CM | POA: Diagnosis not present

## 2021-05-28 DIAGNOSIS — M9903 Segmental and somatic dysfunction of lumbar region: Secondary | ICD-10-CM | POA: Diagnosis not present

## 2021-06-03 ENCOUNTER — Other Ambulatory Visit: Payer: Self-pay | Admitting: Family Medicine

## 2021-07-02 ENCOUNTER — Other Ambulatory Visit: Payer: Self-pay | Admitting: Family Medicine

## 2021-07-02 DIAGNOSIS — E782 Mixed hyperlipidemia: Secondary | ICD-10-CM

## 2021-07-02 DIAGNOSIS — M62830 Muscle spasm of back: Secondary | ICD-10-CM | POA: Diagnosis not present

## 2021-07-02 DIAGNOSIS — M9903 Segmental and somatic dysfunction of lumbar region: Secondary | ICD-10-CM | POA: Diagnosis not present

## 2021-07-02 DIAGNOSIS — M5136 Other intervertebral disc degeneration, lumbar region: Secondary | ICD-10-CM | POA: Diagnosis not present

## 2021-07-02 DIAGNOSIS — M9905 Segmental and somatic dysfunction of pelvic region: Secondary | ICD-10-CM | POA: Diagnosis not present

## 2021-07-04 ENCOUNTER — Other Ambulatory Visit: Payer: Self-pay

## 2021-07-04 ENCOUNTER — Other Ambulatory Visit (INDEPENDENT_AMBULATORY_CARE_PROVIDER_SITE_OTHER): Payer: Medicare Other

## 2021-07-04 DIAGNOSIS — R7303 Prediabetes: Secondary | ICD-10-CM | POA: Diagnosis not present

## 2021-07-04 DIAGNOSIS — E785 Hyperlipidemia, unspecified: Secondary | ICD-10-CM

## 2021-07-04 DIAGNOSIS — Z8639 Personal history of other endocrine, nutritional and metabolic disease: Secondary | ICD-10-CM | POA: Diagnosis not present

## 2021-07-04 LAB — COMPREHENSIVE METABOLIC PANEL
ALT: 17 U/L (ref 0–35)
AST: 15 U/L (ref 0–37)
Albumin: 4.3 g/dL (ref 3.5–5.2)
Alkaline Phosphatase: 51 U/L (ref 39–117)
BUN: 11 mg/dL (ref 6–23)
CO2: 30 mEq/L (ref 19–32)
Calcium: 9.7 mg/dL (ref 8.4–10.5)
Chloride: 102 mEq/L (ref 96–112)
Creatinine, Ser: 0.72 mg/dL (ref 0.40–1.20)
GFR: 81.98 mL/min (ref >60.00)
Glucose, Bld: 98 mg/dL (ref 70–99)
Potassium: 3.8 mEq/L (ref 3.5–5.1)
Sodium: 139 mEq/L (ref 135–145)
Total Bilirubin: 0.3 mg/dL (ref 0.2–1.2)
Total Protein: 6.8 g/dL (ref 6.0–8.3)

## 2021-07-04 LAB — LIPID PANEL
Cholesterol: 170 mg/dL (ref 0–200)
HDL: 54.7 mg/dL (ref >39.00)
NonHDL: 115.02
Total CHOL/HDL Ratio: 3
Triglycerides: 282 mg/dL — ABNORMAL HIGH (ref 0.0–149.0)
VLDL: 56.4 mg/dL — ABNORMAL HIGH (ref 0.0–40.0)

## 2021-07-04 LAB — TSH: TSH: 4.6 u[IU]/mL (ref 0.35–5.50)

## 2021-07-04 LAB — HEMOGLOBIN A1C: Hgb A1c MFr Bld: 5.7 % (ref 4.6–6.5)

## 2021-07-04 LAB — LDL CHOLESTEROL, DIRECT: Direct LDL: 89 mg/dL

## 2021-07-08 ENCOUNTER — Ambulatory Visit (INDEPENDENT_AMBULATORY_CARE_PROVIDER_SITE_OTHER): Payer: Medicare Other | Admitting: Family Medicine

## 2021-07-08 ENCOUNTER — Other Ambulatory Visit: Payer: Self-pay

## 2021-07-08 ENCOUNTER — Encounter: Payer: Self-pay | Admitting: Family Medicine

## 2021-07-08 DIAGNOSIS — E785 Hyperlipidemia, unspecified: Secondary | ICD-10-CM

## 2021-07-08 DIAGNOSIS — G4700 Insomnia, unspecified: Secondary | ICD-10-CM | POA: Diagnosis not present

## 2021-07-08 DIAGNOSIS — M069 Rheumatoid arthritis, unspecified: Secondary | ICD-10-CM | POA: Diagnosis not present

## 2021-07-08 DIAGNOSIS — I10 Essential (primary) hypertension: Secondary | ICD-10-CM

## 2021-07-08 DIAGNOSIS — R7303 Prediabetes: Secondary | ICD-10-CM

## 2021-07-08 NOTE — Patient Instructions (Signed)
Nice to see you. ?Please try to shift your midday meal a little earlier. ?Please make sure you see the eye doctor at least once a year given that you are on Plaquenil.  Please make sure they know that you are on Plaquenil. ?

## 2021-07-08 NOTE — Assessment & Plan Note (Signed)
LDL is well-controlled.  Triglycerides are mildly elevated.  She will continue Lipitor 80 mg once daily.  She will continue Lovaza 2 g twice daily. ?

## 2021-07-08 NOTE — Assessment & Plan Note (Signed)
Stable.  She can continue tramadol 50 mg twice daily as needed for pain.  She will continue to see rheumatology.  I discussed that she needs to see the eye doctor at least once a year while being on Plaquenil.  She will inform them that she is on Plaquenil if she has not already done so. ?

## 2021-07-08 NOTE — Assessment & Plan Note (Signed)
I encouraged healthy diet and exercise.  Discussed shifting her midday meal a little earlier so she does not get shaky. ?

## 2021-07-08 NOTE — Assessment & Plan Note (Signed)
Adequately controlled for her age.  She will continue telmisartan 80 mg once daily, hydrochlorothiazide 12.5 mg once daily, and amlodipine 10 mg once daily. ?

## 2021-07-08 NOTE — Assessment & Plan Note (Signed)
Chronic issue.  Stable.  She can continue Ativan 1 mg nightly at bedtime. ?

## 2021-07-08 NOTE — Progress Notes (Signed)
?Tommi Rumps, MD ?Phone: [phone] ? ?Audrey Hamilton is a 76 y.o. female who presents today for follow-up. ? ?HYPERTENSION ?Disease Monitoring ?Home BP Monitoring 092Z systolically chest pain- no    Dyspnea- no ?Medications ?Compliance-  taking amlodipine, HCTZ, telmisartan.  Edema-only if she is on her feet for too long, resolves with propping her legs up ?BMET ?   ?Component Value Date/Time  ? NA 139 07/04/2021 0957  ? NA 141 05/12/2019 1440  ? K 3.8 07/04/2021 0957  ? CL 102 07/04/2021 0957  ? CO2 30 07/04/2021 0957  ? GLUCOSE 98 07/04/2021 0957  ? BUN 11 07/04/2021 0957  ? BUN 13 05/12/2019 1440  ? CREATININE 0.72 07/04/2021 0957  ? CALCIUM 9.7 07/04/2021 0957  ? GFRNONAA 101 05/12/2019 1440  ? GFRAA 117 05/12/2019 1440  ? ?Rheumatoid arthritis: Patient notes taking tramadol helps take the edge off of the pain.  She continues on Plaquenil through rheumatology.  She reports she sees ophthalmology once yearly.  No drowsiness with the tramadol. ? ?Sleep difficulty: Patient continues on Ativan.  No drowsiness.  She gets 6 hours of sleep nightly.  She does wake up well rested with this. ? ?Prediabetes: Patient bikes and walks for exercise.  She has toast with jelly and coffee in the morning.  She has a sandwich in the late afternoon.  She does note if she does not eat early enough she gets a little shaky.  She has vegetables and meat for dinner. ? ?Social History  ? ?Tobacco Use  ?Smoking Status Former  ? Types: Cigarettes  ? Quit date: 06/19/1995  ? Years since quitting: 26.0  ?Smokeless Tobacco Never  ? ? ?Current Outpatient Medications on File Prior to Visit  ?Medication Sig Dispense Refill  ? amLODipine (NORVASC) 10 MG tablet TAKE 1 TABLET BY MOUTH EVERY DAY 90 tablet 1  ? atorvastatin (LIPITOR) 80 MG tablet TAKE 1 TABLET BY MOUTH EVERY DAY 30 tablet 0  ? cyclobenzaprine (FLEXERIL) 5 MG tablet Take 5 mg by mouth at bedtime as needed.    ? escitalopram (LEXAPRO) 10 MG tablet Take 1 tablet by mouth  daily.    ? Ferrous Fum-Iron Polysacch (TANDEM PO) Take 106 mg by mouth daily.    ? fluticasone (FLONASE) 50 MCG/ACT nasal spray Place into the nose at bedtime. Reported on 10/01/2015    ? hydrochlorothiazide (MICROZIDE) 12.5 MG capsule TAKE 1 CAPSULE BY MOUTH EVERY DAY 90 capsule 1  ? hydroxychloroquine (PLAQUENIL) 200 MG tablet Take 200 mg by mouth 2 (two) times daily.    ? loratadine (CLARITIN) 10 MG tablet TAKE 1 TABLET BY MOUTH EVERY DAY 90 tablet 3  ? LORazepam (ATIVAN) 1 MG tablet TAKE 1 TABLET BY MOUTH EVERYDAY AT BEDTIME 30 tablet 1  ? montelukast (SINGULAIR) 10 MG tablet TAKE 1 TABLET BY MOUTH EVERYDAY AT BEDTIME 90 tablet 0  ? omega-3 acid ethyl esters (LOVAZA) 1 g capsule TAKE 2 CAPSULES BY MOUTH TWICE A DAY 360 capsule 1  ? omeprazole (PRILOSEC) 20 MG capsule Take by mouth.     ? potassium chloride SA (K-DUR,KLOR-CON) 20 MEQ tablet Take 1 tablet (20 mEq total) by mouth daily. 6 tablet 0  ? telmisartan (MICARDIS) 80 MG tablet Take 1 tablet (80 mg total) by mouth daily. 90 tablet 1  ? traMADol (ULTRAM) 50 MG tablet TAKE 1 TABLET BY MOUTH EVERY 8 HOURS AS NEEDED 90 tablet 1  ? ?No current facility-administered medications on file prior to visit.  ? ? ? ?  ROS see history of present illness ? ?Objective ? ?Physical Exam ?Vitals:  ? 07/08/21 1131  ?BP: 140/80  ?Pulse: 77  ?Temp: 98.6 ?F (37 ?C)  ?SpO2: 97%  ? ? ?BP Readings from Last 3 Encounters:  ?07/08/21 140/80  ?04/08/21 130/70  ?01/07/21 125/80  ? ?Wt Readings from Last 3 Encounters:  ?07/08/21 175 lb 9.6 oz (79.7 kg)  ?04/08/21 172 lb 3.2 oz (78.1 kg)  ?01/07/21 171 lb 12.8 oz (77.9 kg)  ? ? ?Physical Exam ?Constitutional:   ?   General: She is not in acute distress. ?   Appearance: She is not diaphoretic.  ?Cardiovascular:  ?   Rate and Rhythm: Normal rate and regular rhythm.  ?   Heart sounds: Normal heart sounds.  ?Pulmonary:  ?   Effort: Pulmonary effort is normal.  ?   Breath sounds: Normal breath sounds.  ?Musculoskeletal:  ?   Right lower leg: No  edema.  ?   Left lower leg: No edema.  ?Skin: ?   General: Skin is warm and dry.  ?Neurological:  ?   Mental Status: She is alert.  ? ? ? ?Assessment/Plan: Please see individual problem list. ? ?Problem List Items Addressed This Visit   ? ? Essential hypertension (Chronic)  ?  Adequately controlled for her age.  She will continue telmisartan 80 mg once daily, hydrochlorothiazide 12.5 mg once daily, and amlodipine 10 mg once daily. ?  ?  ? Insomnia (Chronic)  ?  Chronic issue.  Stable.  She can continue Ativan 1 mg nightly at bedtime. ?  ?  ? Rheumatoid arthritis ([facility]) (Chronic)  ?  Stable.  She can continue tramadol 50 mg twice daily as needed for pain.  She will continue to see rheumatology.  I discussed that she needs to see the eye doctor at least once a year while being on Plaquenil.  She will inform them that she is on Plaquenil if she has not already done so. ?  ?  ? HLD (hyperlipidemia)  ?  LDL is well-controlled.  Triglycerides are mildly elevated.  She will continue Lipitor 80 mg once daily.  She will continue Lovaza 2 g twice daily. ?  ?  ? Prediabetes  ?  I encouraged healthy diet and exercise.  Discussed shifting her midday meal a little earlier so she does not get shaky. ?  ?  ? ? ?Return in about 3 months (around 10/08/2021) for Chronic pain/sleep follow-up. ? ?This visit occurred during the SARS-CoV-2 public health emergency.  Safety protocols were in place, including screening questions prior to the visit, additional usage of staff PPE, and extensive cleaning of exam room while observing appropriate contact time as indicated for disinfecting solutions.  ? ? ?Tommi Rumps, MD ?[place] ? ?

## 2021-07-23 ENCOUNTER — Other Ambulatory Visit: Payer: Self-pay | Admitting: Family Medicine

## 2021-07-23 DIAGNOSIS — Z1231 Encounter for screening mammogram for malignant neoplasm of breast: Secondary | ICD-10-CM

## 2021-07-24 ENCOUNTER — Other Ambulatory Visit: Payer: Self-pay | Admitting: Family Medicine

## 2021-07-24 DIAGNOSIS — E782 Mixed hyperlipidemia: Secondary | ICD-10-CM

## 2021-07-31 ENCOUNTER — Other Ambulatory Visit: Payer: Self-pay

## 2021-07-31 DIAGNOSIS — E782 Mixed hyperlipidemia: Secondary | ICD-10-CM

## 2021-07-31 MED ORDER — ATORVASTATIN CALCIUM 80 MG PO TABS: 80.0000 mg | ORAL_TABLET | Freq: Every day | ORAL | 1 refills | Status: DC

## 2021-08-06 DIAGNOSIS — M62830 Muscle spasm of back: Secondary | ICD-10-CM | POA: Diagnosis not present

## 2021-08-06 DIAGNOSIS — M5136 Other intervertebral disc degeneration, lumbar region: Secondary | ICD-10-CM | POA: Diagnosis not present

## 2021-08-06 DIAGNOSIS — M9905 Segmental and somatic dysfunction of pelvic region: Secondary | ICD-10-CM | POA: Diagnosis not present

## 2021-08-06 DIAGNOSIS — M9903 Segmental and somatic dysfunction of lumbar region: Secondary | ICD-10-CM | POA: Diagnosis not present

## 2021-08-12 ENCOUNTER — Other Ambulatory Visit: Payer: Self-pay | Admitting: Family

## 2021-08-13 ENCOUNTER — Other Ambulatory Visit: Payer: Self-pay | Admitting: Family Medicine

## 2021-08-13 DIAGNOSIS — I10 Essential (primary) hypertension: Secondary | ICD-10-CM

## 2021-08-20 DIAGNOSIS — M170 Bilateral primary osteoarthritis of knee: Secondary | ICD-10-CM | POA: Diagnosis not present

## 2021-08-20 DIAGNOSIS — M7918 Myalgia, other site: Secondary | ICD-10-CM | POA: Diagnosis not present

## 2021-08-20 DIAGNOSIS — Z79899 Other long term (current) drug therapy: Secondary | ICD-10-CM | POA: Diagnosis not present

## 2021-08-20 DIAGNOSIS — M0609 Rheumatoid arthritis without rheumatoid factor, multiple sites: Secondary | ICD-10-CM | POA: Diagnosis not present

## 2021-08-27 ENCOUNTER — Telehealth: Payer: Self-pay | Admitting: Family

## 2021-08-28 NOTE — Telephone Encounter (Signed)
Pt need refill on tramadol sent to [facility] on [address] ?

## 2021-08-29 MED ORDER — TRAMADOL HCL 50 MG PO TABS: 50.0000 mg | ORAL_TABLET | Freq: Three times a day (TID) | ORAL | 0 refills | Status: DC | PRN

## 2021-08-29 NOTE — Addendum Note (Signed)
Addended by: Leone Haven on: 08/29/2021 09:45 AM ? ? Modules accepted: Orders ? ?

## 2021-08-29 NOTE — Telephone Encounter (Signed)
Sent to pharmacy 

## 2021-09-03 DIAGNOSIS — M9905 Segmental and somatic dysfunction of pelvic region: Secondary | ICD-10-CM | POA: Diagnosis not present

## 2021-09-03 DIAGNOSIS — M9903 Segmental and somatic dysfunction of lumbar region: Secondary | ICD-10-CM | POA: Diagnosis not present

## 2021-09-03 DIAGNOSIS — M62830 Muscle spasm of back: Secondary | ICD-10-CM | POA: Diagnosis not present

## 2021-09-03 DIAGNOSIS — M5136 Other intervertebral disc degeneration, lumbar region: Secondary | ICD-10-CM | POA: Diagnosis not present

## 2021-09-14 ENCOUNTER — Other Ambulatory Visit: Payer: Self-pay | Admitting: Family Medicine

## 2021-09-14 DIAGNOSIS — I10 Essential (primary) hypertension: Secondary | ICD-10-CM

## 2021-09-22 ENCOUNTER — Other Ambulatory Visit: Payer: Self-pay | Admitting: Family Medicine

## 2021-09-25 ENCOUNTER — Ambulatory Visit
Admission: RE | Admit: 2021-09-25 | Discharge: 2021-09-25 | Disposition: A | Discharge status: Home / Self Care | Payer: Medicare Other | Source: Ambulatory Visit | Attending: Family Medicine | Admitting: Family Medicine

## 2021-09-25 DIAGNOSIS — Z1231 Encounter for screening mammogram for malignant neoplasm of breast: Secondary | ICD-10-CM

## 2021-10-01 DIAGNOSIS — M9905 Segmental and somatic dysfunction of pelvic region: Secondary | ICD-10-CM | POA: Diagnosis not present

## 2021-10-01 DIAGNOSIS — M62830 Muscle spasm of back: Secondary | ICD-10-CM | POA: Diagnosis not present

## 2021-10-01 DIAGNOSIS — M9903 Segmental and somatic dysfunction of lumbar region: Secondary | ICD-10-CM | POA: Diagnosis not present

## 2021-10-01 DIAGNOSIS — M5136 Other intervertebral disc degeneration, lumbar region: Secondary | ICD-10-CM | POA: Diagnosis not present

## 2021-10-07 ENCOUNTER — Other Ambulatory Visit: Payer: Self-pay | Admitting: Family Medicine

## 2021-10-07 NOTE — Telephone Encounter (Signed)
Refilled: 08/29/2021 Last OV: 07/08/2021 Next OV: 10/10/2021

## 2021-10-10 ENCOUNTER — Ambulatory Visit (INDEPENDENT_AMBULATORY_CARE_PROVIDER_SITE_OTHER): Payer: Medicare Other | Admitting: Family Medicine

## 2021-10-10 ENCOUNTER — Encounter: Payer: Self-pay | Admitting: Family Medicine

## 2021-10-10 DIAGNOSIS — G4700 Insomnia, unspecified: Secondary | ICD-10-CM

## 2021-10-10 DIAGNOSIS — I10 Essential (primary) hypertension: Secondary | ICD-10-CM

## 2021-10-10 DIAGNOSIS — M069 Rheumatoid arthritis, unspecified: Secondary | ICD-10-CM | POA: Diagnosis not present

## 2021-10-10 NOTE — Assessment & Plan Note (Signed)
Mildly elevated at home compared to typical.  Discussed continuing to monitor and if it does not trend down as her rheumatoid arthritis flare improves she needs to let us know.  She will continue amlodipine 10 mg once daily, HCTZ 12.5 mg daily, and telmisartan 80 mg daily.

## 2021-10-10 NOTE — Assessment & Plan Note (Signed)
Stable.  She will continue lorazepam 1 mg nightly.

## 2021-10-28 DIAGNOSIS — M5136 Other intervertebral disc degeneration, lumbar region: Secondary | ICD-10-CM | POA: Diagnosis not present

## 2021-10-28 DIAGNOSIS — M62830 Muscle spasm of back: Secondary | ICD-10-CM | POA: Diagnosis not present

## 2021-10-28 DIAGNOSIS — M9905 Segmental and somatic dysfunction of pelvic region: Secondary | ICD-10-CM | POA: Diagnosis not present

## 2021-10-28 DIAGNOSIS — M9903 Segmental and somatic dysfunction of lumbar region: Secondary | ICD-10-CM | POA: Diagnosis not present

## 2021-10-29 ENCOUNTER — Other Ambulatory Visit: Payer: Self-pay | Admitting: Family Medicine

## 2021-10-29 DIAGNOSIS — E782 Mixed hyperlipidemia: Secondary | ICD-10-CM

## 2021-11-05 ENCOUNTER — Ambulatory Visit (INDEPENDENT_AMBULATORY_CARE_PROVIDER_SITE_OTHER): Payer: Medicare Other

## 2021-11-05 VITALS — Ht 64.0 in | Wt 177.0 lb

## 2021-11-05 DIAGNOSIS — Z0000 Encounter for general adult medical examination without abnormal findings: Secondary | ICD-10-CM | POA: Diagnosis not present

## 2021-11-05 NOTE — Progress Notes (Signed)
Subjective:   Audrey Hamilton is a 76 y.o. female who presents for Medicare Annual (Subsequent) preventive examination.  Review of Systems    No ROS.  Medicare Wellness Virtual Visit.  Visual/audio telehealth visit, UTA vital signs.   See social history for additional risk factors.   Cardiac Risk Factors include: advanced age (>59mn, >>13women)     Objective:    Today's Vitals   11/05/21 1304  Weight: 177 lb (80.3 kg)  Height: '5\' 4"'$  (1.626 m)   Body mass index is 30.38 kg/m.     11/02/2019   10:44 AM 10/31/2018    3:34 PM 07/04/2018    9:02 AM 10/27/2017    9:12 AM 07/22/2015    9:49 AM  Advanced Directives  Does Patient Have a Medical Advance Directive? Yes Yes Yes Yes Yes  Type of AParamed[place]of [place]Living will Healthcare Power of [place]Living will [place] LibertyLiving will [place]Living will  Does patient want to make changes to medical advance directive? No - Patient declined No - Patient declined  No - Patient declined No - Patient declined  Copy of [place] Chart? No - copy requested No - copy requested Yes - validated most recent copy scanned in chart (See row information) No - copy requested No - copy requested    Current Medications (verified) Outpatient Encounter Medications as of 11/05/2021  Medication Sig   amLODipine (NORVASC) 10 MG tablet TAKE 1 TABLET BY MOUTH EVERY DAY   atorvastatin (LIPITOR) 80 MG tablet Take 1 tablet (80 mg total) by mouth daily for 180 doses.   cyclobenzaprine (FLEXERIL) 5 MG tablet Take 5 mg by mouth at bedtime as needed.   escitalopram (LEXAPRO) 10 MG tablet Take 1 tablet by mouth daily.   Ferrous Fum-Iron Polysacch (TANDEM PO) Take 106 mg by mouth daily.   fluticasone (FLONASE) 50 MCG/ACT nasal spray Place into the nose at bedtime. Reported on 10/01/2015   hydrochlorothiazide (MICROZIDE) 12.5 MG capsule TAKE 1  CAPSULE BY MOUTH EVERY DAY   hydroxychloroquine (PLAQUENIL) 200 MG tablet Take 200 mg by mouth 2 (two) times daily.   loratadine (CLARITIN) 10 MG tablet TAKE 1 TABLET BY MOUTH EVERY DAY   LORazepam (ATIVAN) 1 MG tablet TAKE 1 TABLET BY MOUTH EVERYDAY AT BEDTIME   montelukast (SINGULAIR) 10 MG tablet TAKE 1 TABLET BY MOUTH EVERYDAY AT BEDTIME   omega-3 acid ethyl esters (LOVAZA) 1 g capsule TAKE 2 CAPSULES BY MOUTH TWICE A DAY   omeprazole (PRILOSEC) 20 MG capsule Take by mouth.    potassium chloride SA (K-DUR,KLOR-CON) 20 MEQ tablet Take 1 tablet (20 mEq total) by mouth daily.   telmisartan (MICARDIS) 80 MG tablet Take 1 tablet (80 mg total) by mouth daily.   traMADol (ULTRAM) 50 MG tablet TAKE 1 TABLET BY MOUTH EVERY 8 HOURS AS NEEDED.   No facility-administered encounter medications on file as of 11/05/2021.    Allergies (verified) Amoxicillin-pot clavulanate, Aspirin, Baclofen, Fluconazole, Oxaprozin, and Sulfa antibiotics   History: Past Medical History:  Diagnosis Date   Allergy    Anxiety    controlled   Arthritis    Chicken pox    Colon polyps    Depression    Fibromyalgia    History of hypothyroidism    Hyperlipidemia    Hypokalemia    Multiple gastric ulcers    Rheumatoid arthritis ([place] Since 1995   currently in a flare  up   Past Surgical History:  Procedure Laterality Date   ABDOMINAL HYSTERECTOMY  1974   AUGMENTATION MAMMAPLASTY Bilateral 8921'J   silicone   COLONOSCOPY WITH PROPOFOL N/A 07/04/2018   Procedure: COLONOSCOPY WITH PROPOFOL;  Surgeon: Manya Silvas, MD;  Location: [facility];  Service: Endoscopy;  Laterality: N/A;   EYE SURGERY     FOOT SURGERY Left 11/22/10   MASTECTOMY Bilateral 1970's   SUBCUTANEOUS MASTECTOMY - NO CANCER   SMALL INTESTINE SURGERY  12/2001   STOMACH SURGERY  12/2001   TONSILLECTOMY AND ADENOIDECTOMY  1966   UPPER GASTROINTESTINAL ENDOSCOPY     Family History  Problem Relation Age of Onset   Arthritis Mother    Sudden  death Mother        after birth of pt who was 50 days old.   Depression Mother    Arthritis Maternal Grandmother    Cancer Maternal Grandmother    Hyperlipidemia Maternal Grandfather    Diabetes Mellitus II Father    Liver disease Father    Rheum arthritis Son    Stroke Son    Breast cancer Neg Hx    Social History   Socioeconomic History   Marital status: Widowed    Spouse name: Not on file   Number of children: Not on file   Years of education: Not on file   Highest education level: Not on file  Occupational History   Not on file  Tobacco Use   Smoking status: Former    Types: Cigarettes    Quit date: 06/19/1995    Years since quitting: 26.4   Smokeless tobacco: Never  Vaping Use   Vaping Use: Never used  Substance and Sexual Activity   Alcohol use: Yes    Alcohol/week: 0.0 - 1.0 standard drinks of alcohol    Comment: occ   Drug use: No   Sexual activity: Not on file  Other Topics Concern   Not on file  Social History Narrative   Not on file   Social Determinants of Health   Financial Resource Strain: Low Risk  (11/05/2021)   Overall Financial Resource Strain (CARDIA)    Difficulty of Paying Living Expenses: Not hard at all  Food Insecurity: No Food Insecurity (11/05/2021)   Hunger Vital Sign    Worried About Running Out of Food in the Last Year: Never true    Ran Out of Food in the Last Year: Never true  Transportation Needs: No Transportation Needs (11/05/2021)   PRAPARE - Hydrologist (Medical): No    Lack of Transportation (Non-Medical): No  Physical Activity: Insufficiently Active (11/05/2021)   Exercise Vital Sign    Days of Exercise per Week: 3 days    Minutes of Exercise per Session: 10 min  Stress: No Stress Concern Present (11/05/2021)   [place]    Feeling of Stress : Not at all  Social Connections: Unknown (11/05/2021)   Social Connection and Isolation  Panel [NHANES]    Frequency of Communication with Friends and Family: More than three times a week    Frequency of Social Gatherings with Friends and Family: More than three times a week    Attends Religious Services: Not on file    Active Member of Clubs or Organizations: Yes    Attends Archivist Meetings: 1 to 4 times per year    Marital Status: Widowed    Tobacco Counseling Counseling given: Not  Answered   Clinical Intake:  Pre-visit preparation completed: Yes        Diabetes: No  How often do you need to have someone help you when you read instructions, pamphlets, or other written materials from your doctor or pharmacy?: 1 - Never  Interpreter Needed?: No    Activities of Daily Living    11/05/2021    1:11 PM  In your present state of health, do you have any difficulty performing the following activities:  Hearing? 0  Vision? 0  Difficulty concentrating or making decisions? 0  Walking or climbing stairs? 1  Comment Cane in use when walking.  Dressing or bathing? 0  Doing errands, shopping? 0  Preparing Food and eating ? N  Using the Toilet? N  In the past six months, have you accidently leaked urine? N  Do you have problems with loss of bowel control? N  Managing your Medications? N  Managing your Finances? N  Housekeeping or managing your Housekeeping? N    Patient Care Team: Leone Haven, MD as PCP - General (Family Medicine)  Indicate any recent Medical Services you may have received from other than [facility] providers in the past year (date may be approximate).     Assessment:   This is a routine wellness examination for Audrey Hamilton.  Virtual Visit via Telephone Note  I connected with  Audrey Hamilton on 11/05/21 at  1:00 PM EDT by telephone and verified that I am speaking with the correct person using two identifiers.  Persons participating in the virtual visit: patient/Nurse Health Advisor   I discussed the limitations of performing  an evaluation and management service by telehealth.We continued and completed visit with audio only. Some vital signs may be absent or patient reported.   Hearing/Vision screen Hearing Screening - Comments:: Patient is able to hear conversational tones without difficulty. No issues reported. Vision Screening - Comments:: Wears corrective lenses  Cataract extraction, bilateral  They have seen their ophthalmologist in the last 12 months.   Dietary issues and exercise activities discussed: Current Exercise Habits: Home exercise routine, Intensity: Mild Low cholesterol diet Good water intake   Goals Addressed               This Visit's Progress     Patient Stated     Maintain healthy lifestyle (pt-stated)        Stay active. Healthy diet.       Depression Screen    11/05/2021    1:08 PM 10/10/2021   11:33 AM 07/08/2021   11:40 AM 04/08/2021   10:14 AM 01/07/2021   11:01 AM 11/04/2020   10:37 AM 07/15/2020    9:14 AM  PHQ 2/9 Scores  PHQ - 2 Score 0 0 0 0 1 0 0    Fall Risk    11/05/2021    1:11 PM 10/10/2021   11:33 AM 07/08/2021   11:40 AM 04/08/2021   10:14 AM 01/07/2021   11:01 AM  Fall Risk   Falls in the past year? 0 0 0 0 0  Number falls in past yr: 0 0 0 0 0  Injury with Fall?  0 0 0   Risk for fall due to :  No Fall Risks No Fall Risks No Fall Risks   Follow up Falls evaluation completed Falls evaluation completed Falls evaluation completed Falls evaluation completed Falls evaluation completed    [place]: Home free of loose throw rugs in walkways,  pet beds, electrical cords, etc? Yes  Adequate lighting in your home to reduce risk of falls? Yes   ASSISTIVE DEVICES UTILIZED TO PREVENT FALLS: Life alert? Yes Use of a cane, walker or w/c? Yes   TIMED UP AND GO: Was the test performed? No .   Cognitive Function: Patient is alert and oriented x3.  Enjoys puzzles, word search, uses the ipad and other brain health health  stimulating.       10/27/2017    9:42 AM  MMSE - Mini Mental State Exam  Orientation to time 5  Orientation to Place 5  Registration 3  Attention/ Calculation 5  Recall 3  Language- name 2 objects 2  Language- repeat 1  Language- follow 3 step command 3  Language- read & follow direction 1  Write a sentence 1  Copy design 1  Total score 30        10/31/2018    3:39 PM  6CIT Screen  What Year? 0 points  What month? 0 points  What time? 0 points  Count back from 20 0 points  Months in reverse 0 points  Repeat phrase 0 points  Total Score 0 points    Immunizations Immunization History  Administered Date(s) Administered   Influenza, High Dose Seasonal PF 12/12/2016, 01/17/2018, 12/03/2018, 12/03/2018, 12/27/2019   Influenza-Unspecified 12/20/2014, 01/02/2016, 12/16/2020   PFIZER Comirnaty(Gray Top)Covid-19 Tri-Sucrose Vaccine 09/18/2020   PFIZER(Purple Top)SARS-COV-2 Vaccination 05/25/2019, 06/24/2019, 02/26/2020   Pneumococcal Polysaccharide-23 12/03/2018   Tdap 11/02/2018   Zoster, Live 05/09/1997   Pneumococcal vaccine status: Due, Education has been provided regarding the importance of this vaccine. Advised may receive this vaccine at local pharmacy or Health Dept. Aware to provide a copy of the vaccination record if obtained from local pharmacy or Health Dept. Verbalized acceptance and understanding.   Shingrix Completed?: No.    Education has been provided regarding the importance of this vaccine. Patient has been advised to call insurance company to determine out of pocket expense if they have not yet received this vaccine. Advised may also receive vaccine at local pharmacy or Health Dept. Verbalized acceptance and understanding.  Screening Tests Health Maintenance  Topic Date Due   COLONOSCOPY (Pts 45-32yr Insurance coverage will need to be confirmed)  11/18/2021 (Originally 07/03/2021)   COVID-19 Vaccine (5 - Booster for [place]owdenseries) 11/21/2021 (Originally  11/13/2020)   Pneumonia Vaccine 75 Years old (2 - PCV) 12/19/2021 (Originally 12/03/2019)   Zoster Vaccines- Shingrix (1 of 2) 02/05/2022 (Originally 05/30/1964)   INFLUENZA VACCINE  11/18/2021   TETANUS/TDAP  11/01/2028   DEXA SCAN  Completed   Hepatitis C Screening  Completed   HPV VACCINES  Aged Out   Health Maintenance There are no preventive care reminders to display for this patient.  Colonoscopy- deferred per patient request.   Lung Cancer Screening: (Low Dose CT Chest recommended if Age 75-80years, 30 pack-year currently smoking OR have quit w/in 15years.) does not qualify.   Vision Screening: Recommended annual ophthalmology exams for early detection of glaucoma and other disorders of the eye.  Dental Screening: Recommended annual dental exams for proper oral hygiene  Community Resource Referral / Chronic Care Management: CRR required this visit?  No   CCM required this visit?  No      Plan:   Keep all routine maintenance appointments.   I have personally reviewed and noted the following in the patient's chart:   Medical and social history Use of alcohol, tobacco or illicit drugs  Current  medications and supplements including opioid prescriptions. Taking Tramadol. Followed by PCP. Functional ability and status Nutritional status Physical activity Advanced directives List of other physicians Hospitalizations, surgeries, and ER visits in previous 12 months Vitals Screenings to include cognitive, depression, and falls Referrals and appointments  In addition, I have reviewed and discussed with patient certain preventive protocols, quality metrics, and best practice recommendations. A written personalized care plan for preventive services as well as general preventive health recommendations were provided to patient.     Varney Biles, LPN   [id number]/88/[id number]

## 2021-11-05 NOTE — Patient Instructions (Addendum)
Audrey Hamilton , Thank you for taking time to come for your Medicare Wellness Visit. I appreciate your ongoing commitment to your health goals. Please review the following plan we discussed and let me know if I can assist you in the future.   These are the goals we discussed:  Goals       Patient Stated     Maintain healthy lifestyle (pt-stated)      Stay active. Healthy diet.        This is a list of the screening recommended for you and due dates:  Health Maintenance  Topic Date Due   Colon Cancer Screening  11/18/2021*   COVID-19 Vaccine (5 - Booster for Pfizer series) 11/21/2021*   Pneumonia Vaccine (2 - PCV) 12/19/2021*   Zoster (Shingles) Vaccine (1 of 2) 02/05/2022*   Flu Shot  11/18/2021   Tetanus Vaccine  11/01/2028   DEXA scan (bone density measurement)  Completed   Hepatitis C Screening: USPSTF Recommendation to screen - Ages 64-79 yo.  Completed   HPV Vaccine  Aged Out  *Topic was postponed. The date shown is not the original due date.   Opioid Pain Medicine Management Opioids are powerful medicines that are used to treat moderate to severe pain. When used for short periods of time, they can help you to: Sleep better. Do better in physical or occupational therapy. Feel better in the first few days after an injury. Recover from surgery. Opioids should be taken with the supervision of a trained health care provider. They should be taken for the shortest period of time possible. This is because opioids can be addictive, and the longer you take opioids, the greater your risk of addiction. This addiction can also be called opioid use disorder. What are the risks? Using opioid pain medicines for longer than 3 days increases your risk of side effects. Side effects include: Constipation. Nausea and vomiting. Breathing difficulties (respiratory depression). Drowsiness. Confusion. Opioid use disorder. Itching. Taking opioid pain medicine for a long period of time can  affect your ability to do daily tasks. It also puts you at risk for: Motor vehicle crashes. Depression. Suicide. Heart attack. Overdose, which can be life-threatening. What is a pain treatment plan? A pain treatment plan is an agreement between you and your health care provider. Pain is unique to each person, and treatments vary depending on your condition. To manage your pain, you and your health care provider need to work together. To help you do this: Discuss the goals of your treatment, including how much pain you might expect to have and how you will manage the pain. Review the risks and benefits of taking opioid medicines. Remember that a good treatment plan uses more than one approach and minimizes the chance of side effects. Be honest about the amount of medicines you take and about any drug or alcohol use. Get pain medicine prescriptions from only one health care provider. Pain can be managed with many types of alternative treatments. Ask your health care provider to refer you to one or more specialists who can help you manage pain through: Physical or occupational therapy. Counseling (cognitive behavioral therapy). Good nutrition. Biofeedback. Massage. Meditation. Non-opioid medicine. Following a gentle exercise program. How to use opioid pain medicine Taking medicine Take your pain medicine exactly as told by your health care provider. Take it only when you need it. If your pain gets less severe, you may take less than your prescribed dose if your health care provider approves. If  you are not having pain, do nottake pain medicine unless your health care provider tells you to take it. If your pain is severe, do nottry to treat it yourself by taking more pills than instructed on your prescription. Contact your health care provider for help. Write down the times when you take your pain medicine. It is easy to become confused while on pain medicine. Writing the time can help you  avoid overdose. Take other over-the-counter or prescription medicines only as told by your health care provider. Keeping yourself and others safe  While you are taking opioid pain medicine: Do not drive, use machinery, or power tools. Do not sign legal documents. Do not drink alcohol. Do not take sleeping pills. Do not supervise children by yourself. Do not do activities that require climbing or being in high places. Do not go to a lake, river, ocean, spa, or swimming pool. Do not share your pain medicine with anyone. Keep pain medicine in a locked cabinet or in a secure area where pets and children cannot reach it. Stopping your use of opioids If you have been taking opioid medicine for more than a few weeks, you may need to slowly decrease (taper) how much you take until you stop completely. Tapering your use of opioids can decrease your risk of symptoms of withdrawal, such as: Pain and cramping in the abdomen. Nausea. Sweating. Sleepiness. Restlessness. Uncontrollable shaking (tremors). Cravings for the medicine. Do not attempt to taper your use of opioids on your own. Talk with your health care provider about how to do this. Your health care provider may prescribe a step-down schedule based on how much medicine you are taking and how long you have been taking it. Getting rid of leftover pills Do not save any leftover pills. Get rid of leftover pills safely by: Taking the medicine to a prescription take-back program. This is usually offered by the county or law enforcement. Bringing them to a pharmacy that has a drug disposal container. Flushing them down the toilet. Check the label or package insert of your medicine to see whether this is safe to do. Throwing them out in the trash. Check the label or package insert of your medicine to see whether this is safe to do. If it is safe to throw it out, remove the medicine from the original container, put it into a sealable bag or  container, and mix it with used coffee grounds, food scraps, dirt, or cat litter before putting it in the trash. Follow these instructions at home: Activity Do exercises as told by your health care provider. Avoid activities that make your pain worse. Return to your normal activities as told by your health care provider. Ask your health care provider what activities are safe for you. General instructions You may need to take these actions to prevent or treat constipation: Drink enough fluid to keep your urine pale yellow. Take over-the-counter or prescription medicines. Eat foods that are high in fiber, such as beans, whole grains, and fresh fruits and vegetables. Limit foods that are high in fat and processed sugars, such as fried or sweet foods. Keep all follow-up visits. This is important. Where to find support If you have been taking opioids for a long time, you may benefit from receiving support for quitting from a local support group or counselor. Ask your health care provider for a referral to these resources in your area. Where to find more information Centers for Disease Control and Prevention (CDC): http://www.wolf.info/ [place] Food  and Drug Administration (FDA): [place] Get help right away if: You may have taken too much of an opioid (overdosed). Common symptoms of an overdose: Your breathing is slower or more shallow than normal. You have a very slow heartbeat (pulse). You have slurred speech. You have nausea and vomiting. Your pupils become very small. You have other potential symptoms: You are very confused. You faint or feel like you will faint. You have cold, clammy skin. You have blue lips or fingernails. You have thoughts of harming yourself or harming others. These symptoms may represent a serious problem that is an emergency. Do not wait to see if the symptoms will go away. Get medical help right away. Call your local emergency services (911 in the [place] Do not drive  yourself to the hospital.  If you ever feel like you may hurt yourself or others, or have thoughts about taking your own life, get help right away. Go to your nearest emergency department or: Call your local emergency services (911 in the [place] Call the [facility] 9726499740 in the [place] Call a suicide crisis helpline, such as the [place] at (602)111-7315 or 988 in the [place]. This is open 24 hours a day in the [place] Text the Crisis Text Line at 904-078-8007 (in the [place].). Summary Opioid medicines can help you manage moderate to severe pain for a short period of time. A pain treatment plan is an agreement between you and your health care provider. Discuss the goals of your treatment, including how much pain you might expect to have and how you will manage the pain. If you think that you or someone else may have taken too much of an opioid, get medical help right away. This information is not intended to replace advice given to you by your health care provider. Make sure you discuss any questions you have with your health care provider. Document Revised: 10/30/2020 Document Reviewed: 07/17/2020 Elsevier Patient Education  [place].

## 2021-11-11 ENCOUNTER — Other Ambulatory Visit: Payer: Self-pay | Admitting: Family Medicine

## 2021-11-24 ENCOUNTER — Other Ambulatory Visit: Payer: Self-pay | Admitting: Family Medicine

## 2021-12-03 DIAGNOSIS — M9903 Segmental and somatic dysfunction of lumbar region: Secondary | ICD-10-CM | POA: Diagnosis not present

## 2021-12-03 DIAGNOSIS — M62830 Muscle spasm of back: Secondary | ICD-10-CM | POA: Diagnosis not present

## 2021-12-03 DIAGNOSIS — M9905 Segmental and somatic dysfunction of pelvic region: Secondary | ICD-10-CM | POA: Diagnosis not present

## 2021-12-03 DIAGNOSIS — M5136 Other intervertebral disc degeneration, lumbar region: Secondary | ICD-10-CM | POA: Diagnosis not present

## 2021-12-08 ENCOUNTER — Telehealth: Payer: Self-pay | Admitting: Family Medicine

## 2021-12-08 ENCOUNTER — Other Ambulatory Visit: Payer: Self-pay | Admitting: Family

## 2021-12-08 MED ORDER — CLONAZEPAM 1 MG PO TABS: 1.0000 mg | ORAL_TABLET | Freq: Every day | ORAL | 0 refills | Status: DC

## 2021-12-08 NOTE — Telephone Encounter (Signed)
Patient called and said that [facility] could not refill her LORazepam 1 MG.  [facility] told [facility] that [facility] is a Event organiser. Could Dr Caryl Bis prescribed something else?

## 2021-12-09 NOTE — Telephone Encounter (Signed)
Informed pt that a temporary supply of [facility]ep[facility] l[facility]epam is back in stock.

## 2021-12-10 IMAGING — MG DIGITAL SCREENING BREAST BILAT IMPLANT W/ TOMO W/ CAD
9 of 12 series · 9 of 28 positions shown · non-contrast
Comparison: Previous exam(s).

CLINICAL DATA: Screening.

EXAM:
DIGITAL SCREENING BILATERAL MAMMOGRAM WITH IMPLANTS, CAD AND
TOMOSYNTHESIS
TECHNIQUE: Bilateral screening digital craniocaudal and mediolateral oblique
mammograms were obtained. Bilateral screening digital breast
tomosynthesis was performed. The images were evaluated with
computer-aided detection. Standard and/or implant displaced views
were performed.

[L CC]
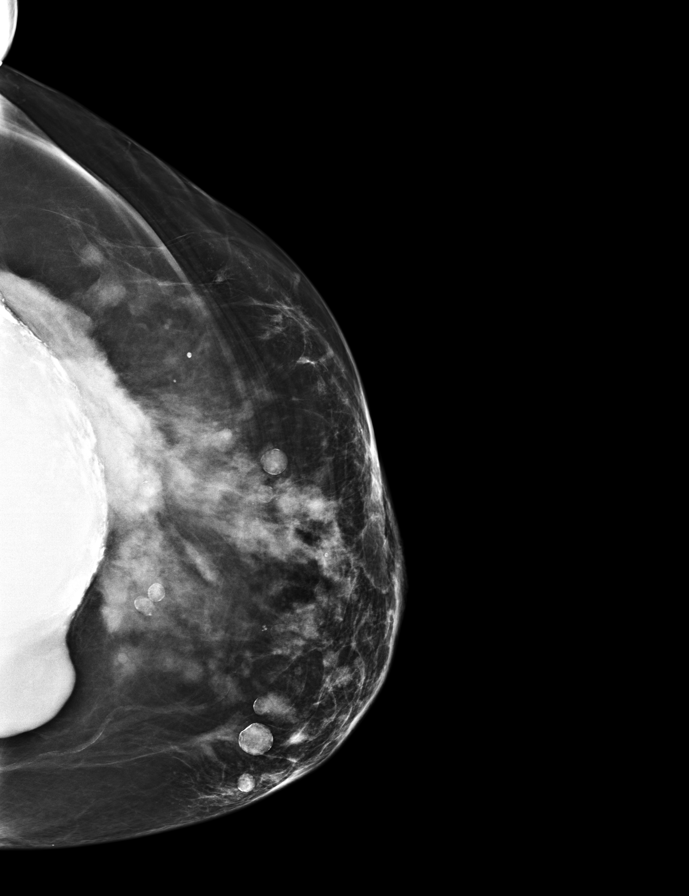

[R MLO]
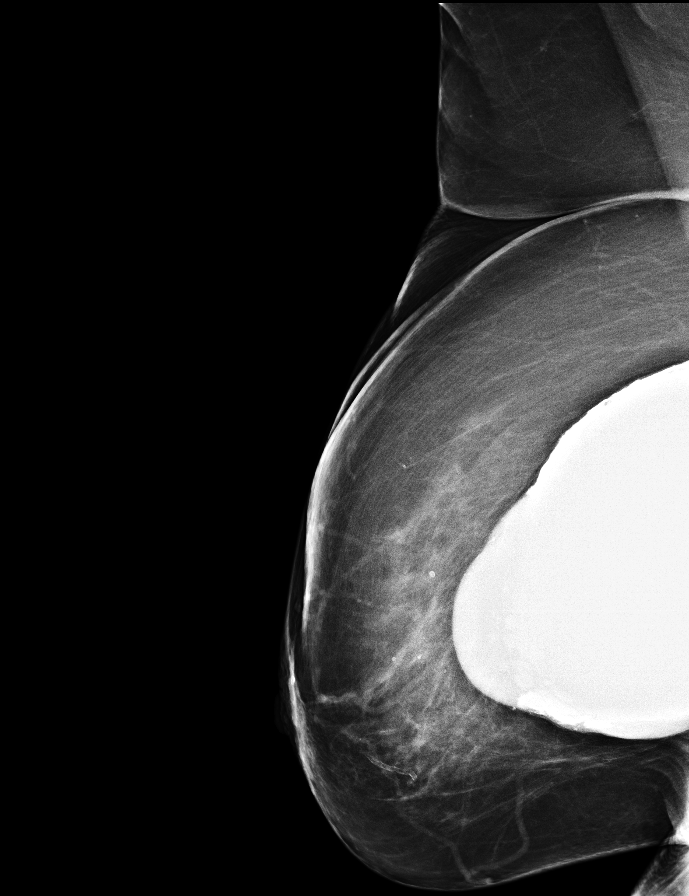

[R CC]
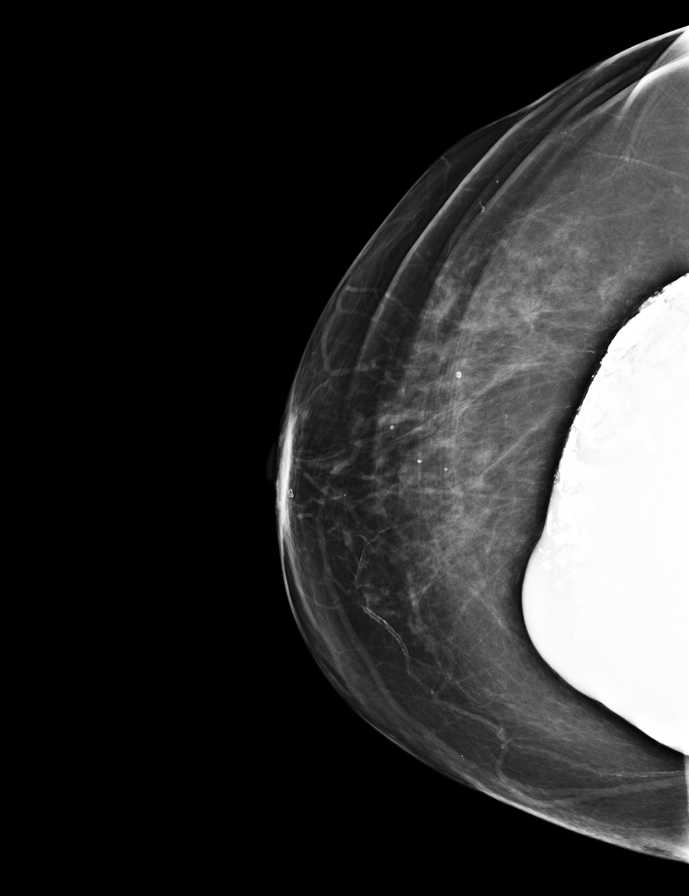

[L MLO]
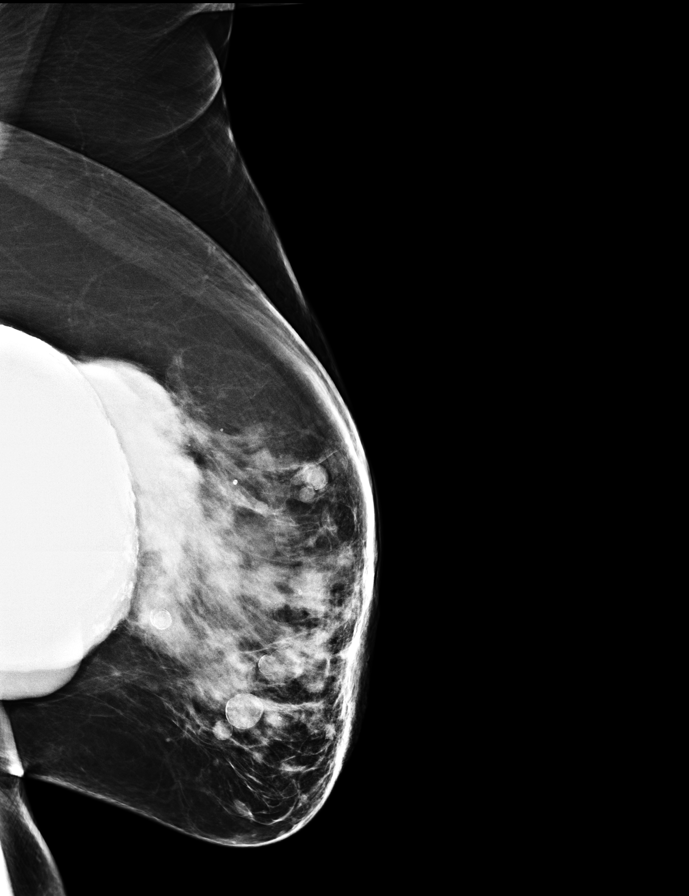

[R CC synth-2D]
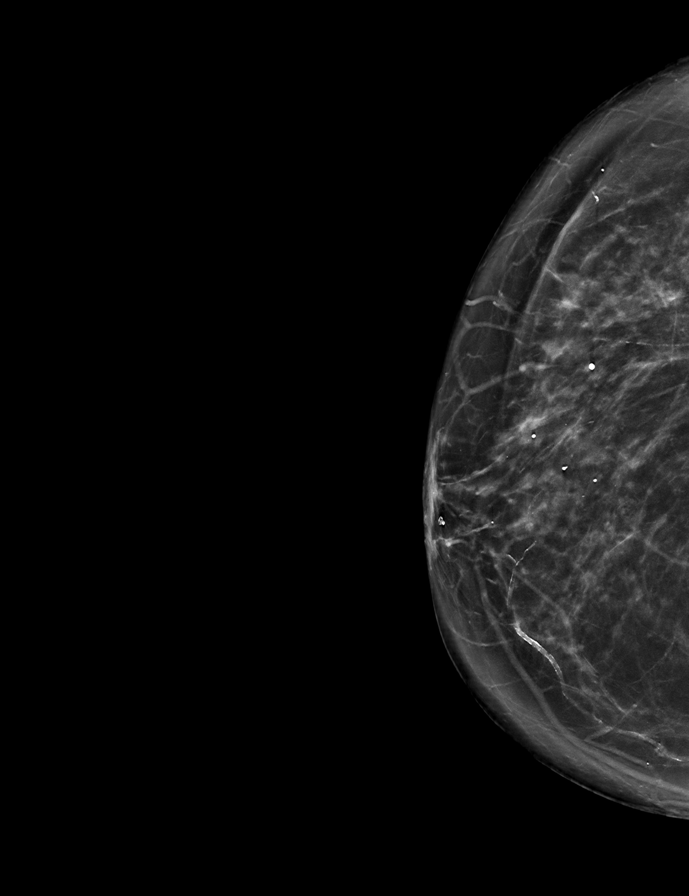

[L MLO synth-2D]
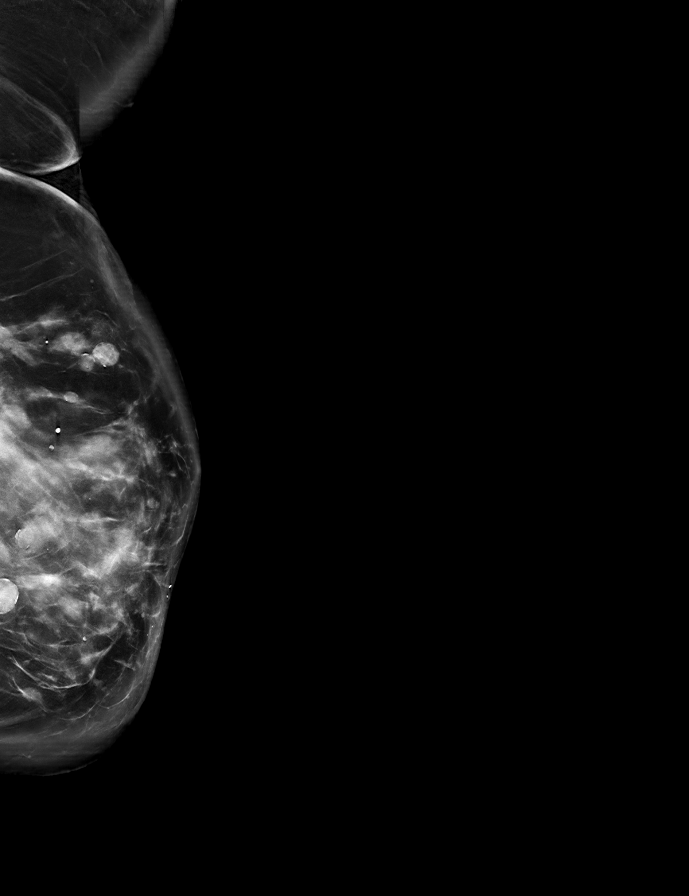

[L CC synth-2D]
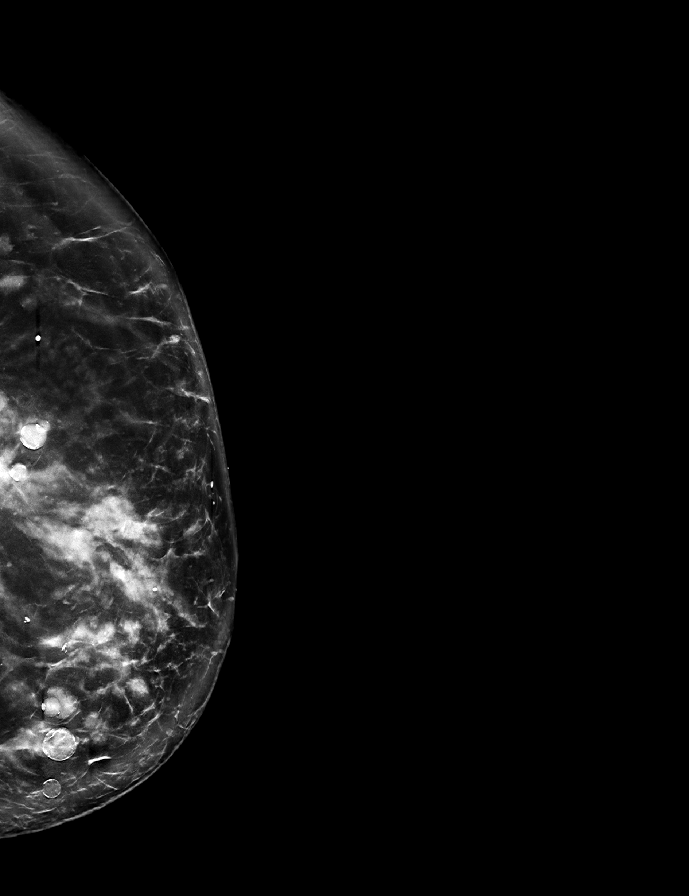

[R MLO synth-2D]
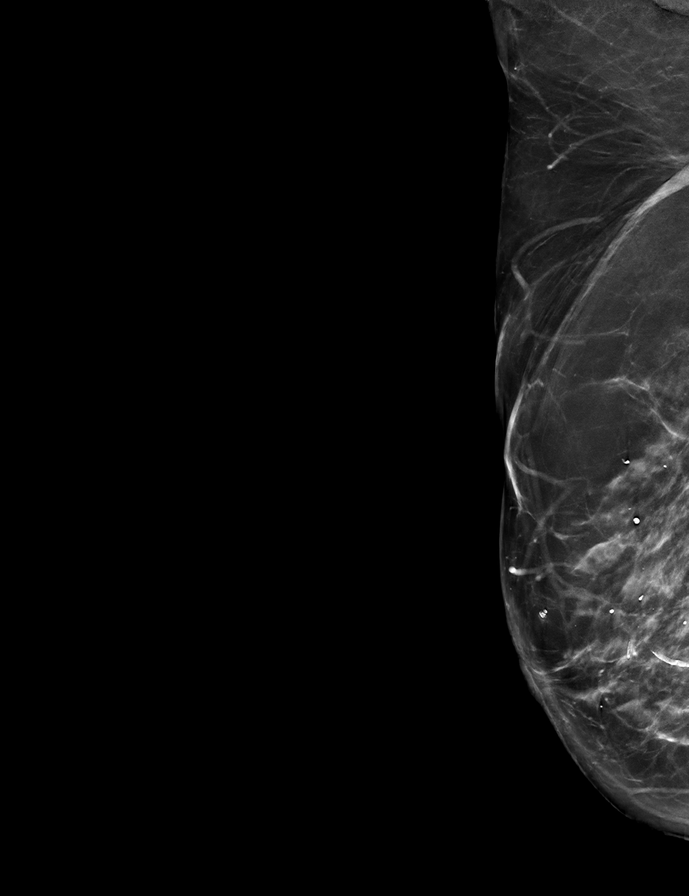

[R CCID BREAST TOMOSYNTHESIS IMAGE tomo · tomo slice 37/73.0]
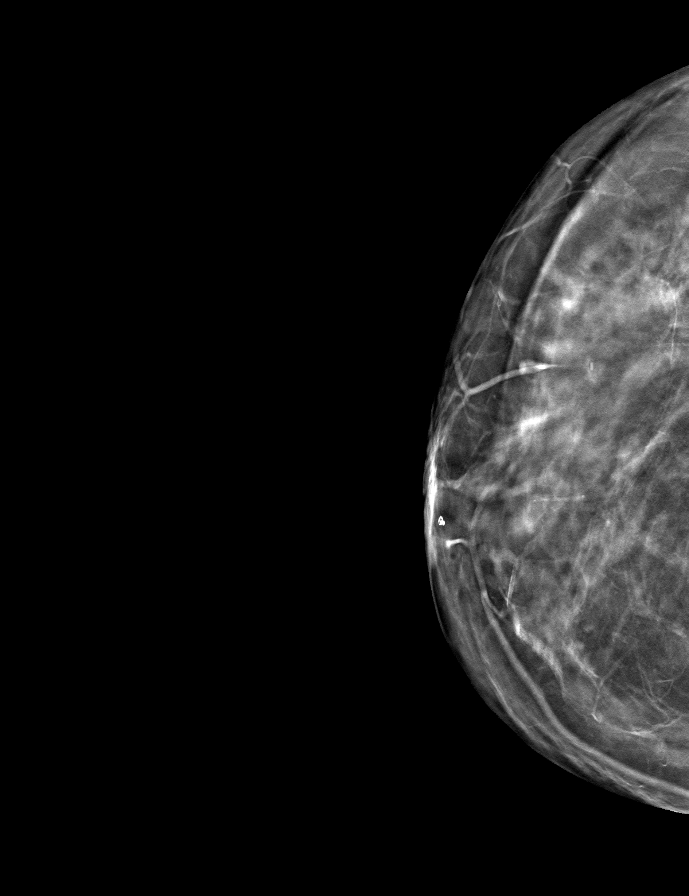

[9 of 28 positions shown; findings below may reference images not displayed]

ACR Breast Density Category c: The breast tissue is heterogeneously
dense, which may obscure small masses.
FINDINGS: The patient has retroglandular implants. There are no findings
suspicious for malignancy.

Extra capsular silicone within the LEFT breast is again noted.
IMPRESSION: No mammographic evidence of malignancy. A result letter of this
screening mammogram will be mailed directly to the patient.

RECOMMENDATION:
Screening mammogram in one year. (Code:N4-V-SA2)

BI-RADS CATEGORY  1:  Negative.

## 2021-12-15 ENCOUNTER — Other Ambulatory Visit: Payer: Self-pay | Admitting: Family Medicine

## 2021-12-23 DIAGNOSIS — M0609 Rheumatoid arthritis without rheumatoid factor, multiple sites: Secondary | ICD-10-CM | POA: Diagnosis not present

## 2021-12-23 DIAGNOSIS — Z79899 Other long term (current) drug therapy: Secondary | ICD-10-CM | POA: Diagnosis not present

## 2021-12-23 DIAGNOSIS — M170 Bilateral primary osteoarthritis of knee: Secondary | ICD-10-CM | POA: Diagnosis not present

## 2021-12-31 DIAGNOSIS — M9903 Segmental and somatic dysfunction of lumbar region: Secondary | ICD-10-CM | POA: Diagnosis not present

## 2021-12-31 DIAGNOSIS — M5136 Other intervertebral disc degeneration, lumbar region: Secondary | ICD-10-CM | POA: Diagnosis not present

## 2021-12-31 DIAGNOSIS — M62830 Muscle spasm of back: Secondary | ICD-10-CM | POA: Diagnosis not present

## 2021-12-31 DIAGNOSIS — M9905 Segmental and somatic dysfunction of pelvic region: Secondary | ICD-10-CM | POA: Diagnosis not present

## 2022-01-06 DIAGNOSIS — Z23 Encounter for immunization: Secondary | ICD-10-CM | POA: Diagnosis not present

## 2022-01-14 ENCOUNTER — Encounter: Payer: Self-pay | Admitting: Family Medicine

## 2022-01-14 ENCOUNTER — Ambulatory Visit (INDEPENDENT_AMBULATORY_CARE_PROVIDER_SITE_OTHER): Payer: Medicare Other | Admitting: Family Medicine

## 2022-01-14 VITALS — BP 160/80 | HR 93 | Temp 99.1°F | Ht 64.0 in | Wt 180.4 lb

## 2022-01-14 DIAGNOSIS — I10 Essential (primary) hypertension: Secondary | ICD-10-CM | POA: Diagnosis not present

## 2022-01-14 DIAGNOSIS — G4700 Insomnia, unspecified: Secondary | ICD-10-CM | POA: Diagnosis not present

## 2022-01-14 DIAGNOSIS — M069 Rheumatoid arthritis, unspecified: Secondary | ICD-10-CM | POA: Diagnosis not present

## 2022-01-14 DIAGNOSIS — D509 Iron deficiency anemia, unspecified: Secondary | ICD-10-CM | POA: Diagnosis not present

## 2022-01-14 MED ORDER — TRAMADOL HCL 50 MG PO TABS: 50.0000 mg | ORAL_TABLET | Freq: Three times a day (TID) | ORAL | 0 refills | Status: DC | PRN

## 2022-01-14 MED ORDER — HYDROCHLOROTHIAZIDE 25 MG PO TABS: 25.0000 mg | ORAL_TABLET | Freq: Every day | ORAL | 3 refills | Status: DC

## 2022-01-14 NOTE — Patient Instructions (Signed)
Nice to see you. We will increase your hydrochlorothiazide dose to 25 mg daily.  Please continue the amlodipine and telmisartan as well. We will contact you with your lab results.

## 2022-01-14 NOTE — Assessment & Plan Note (Signed)
Stable.  She can continue lorazepam 1 mg nightly.  Discussed the risk of falls and dementia with this kind of medication.

## 2022-01-14 NOTE — Assessment & Plan Note (Addendum)
She will continue to see rheumatology.  She will continue tramadol 50 mg twice daily as needed for pain.  Controlled substance database reviewed.  Refill sent to pharmacy.

## 2022-01-14 NOTE — Progress Notes (Signed)
Tommi Rumps, MD Phone: [phone]  Audrey Hamilton is a 76 y.o. female who presents today for f/u.  HYPERTENSION Disease Monitoring Home BP Monitoring 116-162/59-80 Chest pain- no    Dyspnea- no Medications Compliance-  taking amlodipine, telmisartan, HCTZ.   Edema- no BMET    Component Value Date/Time   NA 139 07/04/2021 0957   NA 141 05/12/2019 1440   K 3.8 07/04/2021 0957   CL 102 07/04/2021 0957   CO2 30 07/04/2021 0957   GLUCOSE 98 07/04/2021 0957   BUN 11 07/04/2021 0957   BUN 13 05/12/2019 1440   CREATININE 0.72 07/04/2021 0957   CALCIUM 9.7 07/04/2021 0957   GFRNONAA 101 05/12/2019 1440   GFRAA 117 05/12/2019 1440   Rheumatoid arthritis: Patient continues to take tramadol intermittently to help with her pain related to this.  She notes that is beneficial.  She does not get drowsy with this.  Insomnia: Patient continues on lorazepam nightly for this.  This does help her sleep.  No drowsiness the next day after taking this medication.  Social History   Tobacco Use  Smoking Status Former   Types: Cigarettes   Quit date: 06/19/1995   Years since quitting: 26.5  Smokeless Tobacco Never    Current Outpatient Medications on File Prior to Visit  Medication Sig Dispense Refill   amLODipine (NORVASC) 10 MG tablet TAKE 1 TABLET BY MOUTH EVERY DAY 90 tablet 1   atorvastatin (LIPITOR) 80 MG tablet Take 1 tablet (80 mg total) by mouth daily for 180 doses. 90 tablet 1   clonazePAM (KLONOPIN) 1 MG tablet Take 1 tablet (1 mg total) by mouth at bedtime. 30 tablet 0   cyclobenzaprine (FLEXERIL) 5 MG tablet Take 5 mg by mouth at bedtime as needed.     escitalopram (LEXAPRO) 10 MG tablet Take 1 tablet by mouth daily.     Ferrous Fum-Iron Polysacch (TANDEM PO) Take 106 mg by mouth daily.     fluticasone (FLONASE) 50 MCG/ACT nasal spray Place into the nose at bedtime. Reported on 10/01/2015     hydroxychloroquine (PLAQUENIL) 200 MG tablet Take 200 mg by mouth 2 (two) times  daily.     loratadine (CLARITIN) 10 MG tablet TAKE 1 TABLET BY MOUTH EVERY DAY 90 tablet 3   montelukast (SINGULAIR) 10 MG tablet TAKE 1 TABLET BY MOUTH EVERYDAY AT BEDTIME 90 tablet 0   omega-3 acid ethyl esters (LOVAZA) 1 g capsule TAKE 2 CAPSULES BY MOUTH TWICE A DAY 360 capsule 1   omeprazole (PRILOSEC) 20 MG capsule Take by mouth.      potassium chloride SA (K-DUR,KLOR-CON) 20 MEQ tablet Take 1 tablet (20 mEq total) by mouth daily. 6 tablet 0   telmisartan (MICARDIS) 80 MG tablet Take 1 tablet (80 mg total) by mouth daily. 90 tablet 1   No current facility-administered medications on file prior to visit.     ROS see history of present illness  Objective  Physical Exam Vitals:   01/14/22 1430  BP: (!) 160/80  Pulse: 93  Temp: 99.1 F (37.3 C)  SpO2: 97%    BP Readings from Last 3 Encounters:  01/14/22 (!) 160/80  10/10/21 118/80  07/08/21 140/80   Wt Readings from Last 3 Encounters:  01/14/22 180 lb 6.4 oz (81.8 kg)  11/05/21 177 lb (80.3 kg)  10/10/21 177 lb 12.8 oz (80.6 kg)    Physical Exam Constitutional:      General: She is not in acute distress.    Appearance:  She is not diaphoretic.  Cardiovascular:     Rate and Rhythm: Normal rate and regular rhythm.     Heart sounds: Normal heart sounds.  Pulmonary:     Effort: Pulmonary effort is normal.     Breath sounds: Normal breath sounds.  Musculoskeletal:     Right lower leg: No edema.     Left lower leg: No edema.  Skin:    General: Skin is warm and dry.  Neurological:     Mental Status: She is alert.      Assessment/Plan: Please see individual problem list.  Problem List Items Addressed This Visit     Essential hypertension - Primary (Chronic)    Uncontrolled.  We will increase HCTZ to 25 mg daily.  We will check a BMP today.  She will return in 1 month to see me for follow-up.  She will continue telmisartan 80 mg daily and amlodipine 10 mg daily.      Relevant Medications    hydrochlorothiazide (HYDRODIURIL) 25 MG tablet   Other Relevant Orders   Basic Metabolic Panel (BMET)   Insomnia (Chronic)    Stable.  She can continue lorazepam 1 mg nightly.  Discussed the risk of falls and dementia with this kind of medication.      Rheumatoid arthritis ([place]) (Chronic)    She will continue to see rheumatology.  She will continue tramadol 50 mg twice daily as needed for pain.  Controlled substance database reviewed.  Refill sent to pharmacy.      Relevant Medications   traMADol (ULTRAM) 50 MG tablet   Iron deficiency anemia    Check iron levels.      Relevant Orders   IBC + Ferritin     Return in about 1 month (around 02/13/2022) for Hypertension follow-up with PCP.   Tommi Rumps, MD [place]

## 2022-01-14 NOTE — Assessment & Plan Note (Signed)
Uncontrolled.  We will increase HCTZ to 25 mg daily.  We will check a BMP today.  She will return in 1 month to see me for follow-up.  She will continue telmisartan 80 mg daily and amlodipine 10 mg daily.

## 2022-01-14 NOTE — Assessment & Plan Note (Signed)
Check iron levels

## 2022-01-15 LAB — BASIC METABOLIC PANEL
BUN: 12 mg/dL (ref 6–23)
CO2: 23 mEq/L (ref 19–32)
Calcium: 9.6 mg/dL (ref 8.4–10.5)
Chloride: 103 mEq/L (ref 96–112)
Creatinine, Ser: 0.69 mg/dL (ref 0.40–1.20)
GFR: 84.77 mL/min (ref >60.00)
Glucose, Bld: 95 mg/dL (ref 70–99)
Potassium: 3.9 mEq/L (ref 3.5–5.1)
Sodium: 136 mEq/L (ref 135–145)

## 2022-01-15 LAB — IBC + FERRITIN
Ferritin: 21.4 ng/mL (ref 10.0–291.0)
Iron: 48 ug/dL (ref 42–145)
Saturation Ratios: 11.9 % — ABNORMAL LOW (ref 20.0–50.0)
TIBC: 404.6 ug/dL (ref 250.0–450.0)
Transferrin: 289 mg/dL (ref 212.0–360.0)

## 2022-02-04 ENCOUNTER — Other Ambulatory Visit: Payer: Self-pay | Admitting: Family Medicine

## 2022-02-04 DIAGNOSIS — M9905 Segmental and somatic dysfunction of pelvic region: Secondary | ICD-10-CM | POA: Diagnosis not present

## 2022-02-04 DIAGNOSIS — I10 Essential (primary) hypertension: Secondary | ICD-10-CM

## 2022-02-04 DIAGNOSIS — M5136 Other intervertebral disc degeneration, lumbar region: Secondary | ICD-10-CM | POA: Diagnosis not present

## 2022-02-04 DIAGNOSIS — M62830 Muscle spasm of back: Secondary | ICD-10-CM | POA: Diagnosis not present

## 2022-02-04 DIAGNOSIS — M9903 Segmental and somatic dysfunction of lumbar region: Secondary | ICD-10-CM | POA: Diagnosis not present

## 2022-02-17 ENCOUNTER — Ambulatory Visit (INDEPENDENT_AMBULATORY_CARE_PROVIDER_SITE_OTHER): Payer: Medicare Other | Admitting: Family Medicine

## 2022-02-17 ENCOUNTER — Encounter: Payer: Self-pay | Admitting: Family Medicine

## 2022-02-17 VITALS — BP 118/78 | HR 88 | Temp 98.5°F | Ht 64.0 in | Wt 178.2 lb

## 2022-02-17 DIAGNOSIS — D509 Iron deficiency anemia, unspecified: Secondary | ICD-10-CM

## 2022-02-17 DIAGNOSIS — I10 Essential (primary) hypertension: Secondary | ICD-10-CM

## 2022-02-17 DIAGNOSIS — R5383 Other fatigue: Secondary | ICD-10-CM | POA: Insufficient documentation

## 2022-02-17 DIAGNOSIS — R5382 Chronic fatigue, unspecified: Secondary | ICD-10-CM | POA: Diagnosis not present

## 2022-02-17 LAB — BASIC METABOLIC PANEL
BUN: 21 mg/dL (ref 6–23)
CO2: 26 mEq/L (ref 19–32)
Calcium: 9.9 mg/dL (ref 8.4–10.5)
Chloride: 97 mEq/L (ref 96–112)
Creatinine, Ser: 0.79 mg/dL (ref 0.40–1.20)
GFR: 73.02 mL/min (ref >60.00)
Glucose, Bld: 122 mg/dL — ABNORMAL HIGH (ref 70–99)
Potassium: 3.7 mEq/L (ref 3.5–5.1)
Sodium: 136 mEq/L (ref 135–145)

## 2022-02-17 LAB — CBC
HCT: 41.4 % (ref 36.0–46.0)
Hemoglobin: 14 g/dL (ref 12.0–15.0)
MCHC: 33.9 g/dL (ref 30.0–36.0)
MCV: 89.6 fl (ref 78.0–100.0)
Platelets: 323 10*3/uL (ref 150.0–400.0)
RBC: 4.62 Mil/uL (ref 3.87–5.11)
RDW: 13.5 % (ref 11.5–15.5)
WBC: 9.4 10*3/uL (ref 4.0–10.5)

## 2022-02-17 LAB — IBC + FERRITIN
Ferritin: 14.7 ng/mL (ref 10.0–291.0)
Iron: 59 ug/dL (ref 42–145)
Saturation Ratios: 13.3 % — ABNORMAL LOW (ref 20.0–50.0)
TIBC: 445.2 ug/dL (ref 250.0–450.0)
Transferrin: 318 mg/dL (ref 212.0–360.0)

## 2022-02-17 LAB — VITAMIN B12: Vitamin B-12: 763 pg/mL (ref 211–911)

## 2022-02-17 MED ORDER — TRAMADOL HCL 50 MG PO TABS: 50.0000 mg | ORAL_TABLET | Freq: Three times a day (TID) | ORAL | 0 refills | Status: DC | PRN

## 2022-02-17 NOTE — Assessment & Plan Note (Signed)
Patient is still taking an iron supplement. Iron levels trended down some on last check. Recheck today. If they are not improved from her last check I would consider a GI referral for additional evaluation.

## 2022-02-17 NOTE — Progress Notes (Signed)
Tommi Rumps, MD Phone: [phone]  Audrey Hamilton is a 76 y.o. female who presents today for f/u.  HYPERTENSION Disease Monitoring Home BP Monitoring brought cuff in today and it read 916 systolic compared to our well controlled reading Chest pain- no    Dyspnea- no Medications Compliance-  taking amlodipine, HCTZ, telmisartan.   Edema- no Notes chronic issues with fatigue that she attributes to her BP medications. It did not worsen with the increase in dose of HCTZ.  BMET    Component Value Date/Time   NA 136 01/14/2022 1437   NA 141 05/12/2019 1440   K 3.9 01/14/2022 1437   CL 103 01/14/2022 1437   CO2 23 01/14/2022 1437   GLUCOSE 95 01/14/2022 1437   BUN 12 01/14/2022 1437   BUN 13 05/12/2019 1440   CREATININE 0.69 01/14/2022 1437   CALCIUM 9.6 01/14/2022 1437   GFRNONAA 101 05/12/2019 1440   GFRAA 117 05/12/2019 1440     Social History   Tobacco Use  Smoking Status Former   Types: Cigarettes   Quit date: 06/19/1995   Years since quitting: 26.6  Smokeless Tobacco Never    Current Outpatient Medications on File Prior to Visit  Medication Sig Dispense Refill   amLODipine (NORVASC) 10 MG tablet TAKE 1 TABLET BY MOUTH EVERY DAY 90 tablet 1   clonazePAM (KLONOPIN) 1 MG tablet Take 1 tablet (1 mg total) by mouth at bedtime. 30 tablet 0   cyclobenzaprine (FLEXERIL) 5 MG tablet Take 5 mg by mouth at bedtime as needed.     escitalopram (LEXAPRO) 10 MG tablet Take 1 tablet by mouth daily.     Ferrous Fum-Iron Polysacch (TANDEM PO) Take 106 mg by mouth daily.     fluticasone (FLONASE) 50 MCG/ACT nasal spray Place into the nose at bedtime. Reported on 10/01/2015     hydrochlorothiazide (HYDRODIURIL) 25 MG tablet Take 1 tablet (25 mg total) by mouth daily. 90 tablet 3   hydroxychloroquine (PLAQUENIL) 200 MG tablet Take 200 mg by mouth 2 (two) times daily.     loratadine (CLARITIN) 10 MG tablet TAKE 1 TABLET BY MOUTH EVERY DAY 90 tablet 3   montelukast (SINGULAIR) 10  MG tablet TAKE 1 TABLET BY MOUTH EVERYDAY AT BEDTIME 90 tablet 0   omega-3 acid ethyl esters (LOVAZA) 1 g capsule TAKE 2 CAPSULES BY MOUTH TWICE A DAY 360 capsule 1   omeprazole (PRILOSEC) 20 MG capsule Take by mouth.      potassium chloride SA (K-DUR,KLOR-CON) 20 MEQ tablet Take 1 tablet (20 mEq total) by mouth daily. 6 tablet 0   telmisartan (MICARDIS) 80 MG tablet Take 1 tablet (80 mg total) by mouth daily. 90 tablet 1   atorvastatin (LIPITOR) 80 MG tablet Take 1 tablet (80 mg total) by mouth daily for 180 doses. 90 tablet 1   No current facility-administered medications on file prior to visit.     ROS see history of present illness  Objective  Physical Exam Vitals:   02/17/22 1108  BP: 118/78  Pulse: 88  Temp: 98.5 F (36.9 C)  SpO2: 96%    BP Readings from Last 3 Encounters:  02/17/22 118/78  01/14/22 (!) 160/80  10/10/21 118/80   Wt Readings from Last 3 Encounters:  02/17/22 178 lb 3.2 oz (80.8 kg)  01/14/22 180 lb 6.4 oz (81.8 kg)  11/05/21 177 lb (80.3 kg)    Physical Exam Constitutional:      General: She is not in acute distress.  Appearance: She is not diaphoretic.  Cardiovascular:     Rate and Rhythm: Normal rate and regular rhythm.     Heart sounds: Normal heart sounds.  Pulmonary:     Effort: Pulmonary effort is normal.     Breath sounds: Normal breath sounds.  Skin:    General: Skin is warm and dry.  Neurological:     Mental Status: She is alert.      Assessment/Plan: Please see individual problem list.  Problem List Items Addressed This Visit     Essential hypertension - Primary (Chronic)    Adequately controlled.  She will continue amlodipine 10 mg once daily, telmisartan 80 mg daily, and HCTZ 25 mg daily.  Check BMP.      Relevant Orders   Basic Metabolic Panel (BMET)   Fatigue (Chronic)    Possibly related to blood pressure medications.  We will check additional labs to evaluate further.  Most recent TSH was acceptable.       Relevant Orders   B12   Iron deficiency anemia (Chronic)    Patient is still taking an iron supplement. Iron levels trended down some on last check. Recheck today. If they are not improved from her last check I would consider a GI referral for additional evaluation.       Relevant Orders   IBC + Ferritin   CBC     Return in about 3 months (around 05/20/2022).   Tommi Rumps, MD [place]

## 2022-02-17 NOTE — Assessment & Plan Note (Signed)
Adequately controlled.  She will continue amlodipine 10 mg once daily, telmisartan 80 mg daily, and HCTZ 25 mg daily.  Check BMP.

## 2022-02-17 NOTE — Assessment & Plan Note (Signed)
Possibly related to blood pressure medications.  We will check additional labs to evaluate further.  Most recent TSH was acceptable.

## 2022-02-17 NOTE — Patient Instructions (Signed)
Nice to see you. We will check lab work today and contact you with the results. Please get a new blood pressure cuff.

## 2022-02-18 ENCOUNTER — Other Ambulatory Visit: Payer: Self-pay | Admitting: Family Medicine

## 2022-02-18 DIAGNOSIS — E782 Mixed hyperlipidemia: Secondary | ICD-10-CM

## 2022-03-03 ENCOUNTER — Other Ambulatory Visit: Payer: Self-pay | Admitting: Family Medicine

## 2022-03-03 DIAGNOSIS — D509 Iron deficiency anemia, unspecified: Secondary | ICD-10-CM

## 2022-03-04 DIAGNOSIS — M5136 Other intervertebral disc degeneration, lumbar region: Secondary | ICD-10-CM | POA: Diagnosis not present

## 2022-03-04 DIAGNOSIS — M62830 Muscle spasm of back: Secondary | ICD-10-CM | POA: Diagnosis not present

## 2022-03-04 DIAGNOSIS — M9905 Segmental and somatic dysfunction of pelvic region: Secondary | ICD-10-CM | POA: Diagnosis not present

## 2022-03-04 DIAGNOSIS — M9903 Segmental and somatic dysfunction of lumbar region: Secondary | ICD-10-CM | POA: Diagnosis not present

## 2022-03-05 ENCOUNTER — Other Ambulatory Visit: Payer: Self-pay | Admitting: Family

## 2022-03-09 ENCOUNTER — Other Ambulatory Visit: Payer: Self-pay | Admitting: Family Medicine

## 2022-03-09 DIAGNOSIS — I10 Essential (primary) hypertension: Secondary | ICD-10-CM

## 2022-03-10 ENCOUNTER — Other Ambulatory Visit: Payer: Self-pay

## 2022-03-10 ENCOUNTER — Telehealth: Payer: Self-pay

## 2022-03-10 DIAGNOSIS — I10 Essential (primary) hypertension: Secondary | ICD-10-CM

## 2022-03-10 NOTE — Telephone Encounter (Signed)
Pt requesting RF on HCTZ 12.5 mg & Lorazepam to be sent to [facility] [address], [address]. Pt requesting callback......Marland Kitchen

## 2022-03-11 ENCOUNTER — Telehealth: Payer: Self-pay

## 2022-03-11 ENCOUNTER — Other Ambulatory Visit: Payer: Self-pay

## 2022-03-11 DIAGNOSIS — I10 Essential (primary) hypertension: Secondary | ICD-10-CM

## 2022-03-11 MED ORDER — CLONAZEPAM 1 MG PO TABS: 1.0000 mg | ORAL_TABLET | Freq: Every day | ORAL | 0 refills | Status: DC

## 2022-03-11 MED ORDER — HYDROCHLOROTHIAZIDE 25 MG PO TABS: 25.0000 mg | ORAL_TABLET | Freq: Every day | ORAL | 3 refills | Status: DC

## 2022-03-11 NOTE — Telephone Encounter (Signed)
Patient is requesting a refill of Lorazepam not Clonazepam

## 2022-03-11 NOTE — Telephone Encounter (Signed)
Orders pended for HCTZ and Clonazepam but refill request is for Lorazepam

## 2022-03-11 NOTE — Telephone Encounter (Signed)
Patient is not on lorazepam.  She has been on clonazepam.  There may have been some confusion on the naming of the benzodiazepine that she is on.  I sent clonazepam and HCTZ to the pharmacy that was requested.  Please let her know this.

## 2022-03-11 NOTE — Addendum Note (Signed)
Addended by: Leone Haven on: 03/11/2022 03:24 PM   Modules accepted: Orders

## 2022-03-12 ENCOUNTER — Encounter: Payer: Self-pay | Admitting: Family Medicine

## 2022-03-12 ENCOUNTER — Other Ambulatory Visit: Payer: Self-pay | Admitting: Family Medicine

## 2022-03-13 NOTE — Telephone Encounter (Signed)
Duplicate- please refuse  Last refilled on 03/11/22

## 2022-03-19 ENCOUNTER — Other Ambulatory Visit: Payer: Self-pay | Admitting: Family Medicine

## 2022-03-30 ENCOUNTER — Ambulatory Visit: Payer: Medicare Other | Admitting: Family Medicine

## 2022-04-01 DIAGNOSIS — M9903 Segmental and somatic dysfunction of lumbar region: Secondary | ICD-10-CM | POA: Diagnosis not present

## 2022-04-01 DIAGNOSIS — M5136 Other intervertebral disc degeneration, lumbar region: Secondary | ICD-10-CM | POA: Diagnosis not present

## 2022-04-01 DIAGNOSIS — M9905 Segmental and somatic dysfunction of pelvic region: Secondary | ICD-10-CM | POA: Diagnosis not present

## 2022-04-01 DIAGNOSIS — M62830 Muscle spasm of back: Secondary | ICD-10-CM | POA: Diagnosis not present

## 2022-04-08 ENCOUNTER — Other Ambulatory Visit: Payer: Self-pay | Admitting: Family Medicine

## 2022-04-08 DIAGNOSIS — E782 Mixed hyperlipidemia: Secondary | ICD-10-CM

## 2022-04-08 NOTE — Telephone Encounter (Signed)
Requesting: Clonazepam 1 MG Contract: No UDS: No Last Visit: 02/17/2022 Next Visit: 05/20/2022 Last Refill: 03/11/22  Please Advise

## 2022-04-21 ENCOUNTER — Other Ambulatory Visit: Payer: Self-pay | Admitting: Family Medicine

## 2022-04-21 NOTE — Telephone Encounter (Signed)
Refilled: 03/19/2022 Last OV: 02/17/2022 Next OV: 05/20/2022

## 2022-05-11 ENCOUNTER — Other Ambulatory Visit: Payer: Self-pay | Admitting: Family Medicine

## 2022-05-11 ENCOUNTER — Encounter: Payer: Self-pay | Admitting: Family Medicine

## 2022-05-14 ENCOUNTER — Other Ambulatory Visit (HOSPITAL_COMMUNITY): Payer: Self-pay

## 2022-05-14 DIAGNOSIS — M9905 Segmental and somatic dysfunction of pelvic region: Secondary | ICD-10-CM | POA: Diagnosis not present

## 2022-05-14 DIAGNOSIS — M62830 Muscle spasm of back: Secondary | ICD-10-CM | POA: Diagnosis not present

## 2022-05-14 DIAGNOSIS — M5136 Other intervertebral disc degeneration, lumbar region: Secondary | ICD-10-CM | POA: Diagnosis not present

## 2022-05-14 DIAGNOSIS — M9903 Segmental and somatic dysfunction of lumbar region: Secondary | ICD-10-CM | POA: Diagnosis not present

## 2022-05-14 NOTE — Telephone Encounter (Signed)
Per Test claim, PA not needed, med was filled 05-12-2022

## 2022-05-15 DIAGNOSIS — M170 Bilateral primary osteoarthritis of knee: Secondary | ICD-10-CM | POA: Diagnosis not present

## 2022-05-20 ENCOUNTER — Ambulatory Visit (INDEPENDENT_AMBULATORY_CARE_PROVIDER_SITE_OTHER): Payer: Medicare Other | Admitting: Family Medicine

## 2022-05-20 ENCOUNTER — Encounter: Payer: Self-pay | Admitting: Family Medicine

## 2022-05-20 VITALS — BP 150/78 | HR 84 | Temp 98.1°F | Ht 64.0 in | Wt 179.6 lb

## 2022-05-20 DIAGNOSIS — Z23 Encounter for immunization: Secondary | ICD-10-CM | POA: Diagnosis not present

## 2022-05-20 DIAGNOSIS — G4700 Insomnia, unspecified: Secondary | ICD-10-CM

## 2022-05-20 DIAGNOSIS — R7303 Prediabetes: Secondary | ICD-10-CM

## 2022-05-20 DIAGNOSIS — M25569 Pain in unspecified knee: Secondary | ICD-10-CM | POA: Diagnosis not present

## 2022-05-20 DIAGNOSIS — M069 Rheumatoid arthritis, unspecified: Secondary | ICD-10-CM

## 2022-05-20 DIAGNOSIS — E785 Hyperlipidemia, unspecified: Secondary | ICD-10-CM

## 2022-05-20 DIAGNOSIS — I10 Essential (primary) hypertension: Secondary | ICD-10-CM | POA: Diagnosis not present

## 2022-05-20 DIAGNOSIS — M1711 Unilateral primary osteoarthritis, right knee: Secondary | ICD-10-CM | POA: Insufficient documentation

## 2022-05-20 NOTE — Assessment & Plan Note (Signed)
Improving.  She will continue to see orthopedics.

## 2022-05-20 NOTE — Patient Instructions (Signed)
Nice to see you. Please consider getting the Shingrix (shingles) vaccine at the pharmacy. Please check your blood pressure daily.  Please rest for 10 to 15 minutes before checking it.  Please send me a list of your readings in a few weeks.

## 2022-05-20 NOTE — Assessment & Plan Note (Signed)
Chronic issue.  Poorly controlled.  Discussed adding additional medication though the patient is hesitant to do this at this time and wants to see if her blood pressure comes down more as her pain in her knee improves.  She will check her blood pressure daily after rest for 10 to 15 minutes.  She will send me a list of her readings in a few weeks.  She will continue amlodipine 10 mg daily, HCTZ 25 mg daily, and telmisartan 80 mg daily.

## 2022-05-20 NOTE — Progress Notes (Signed)
Tommi Rumps, MD Phone: [phone]  Audrey Hamilton is a 77 y.o. female who presents today for f/u.  HYPERTENSION Disease Monitoring Home BP Monitoring 138-156/66-79 Chest pain- no    Dyspnea- no Medications Compliance-  taking amlodipine, HCTZ, telmisartan.  Edema- no BMET    Component Value Date/Time   NA 136 02/17/2022 1127   NA 141 05/12/2019 1440   K 3.7 02/17/2022 1127   CL 97 02/17/2022 1127   CO2 26 02/17/2022 1127   GLUCOSE 122 (H) 02/17/2022 1127   BUN 21 02/17/2022 1127   BUN 13 05/12/2019 1440   CREATININE 0.79 02/17/2022 1127   CALCIUM 9.9 02/17/2022 1127   GFRNONAA 101 05/12/2019 1440   GFRAA 117 05/12/2019 1440   Insomnia: This is stable.  She gets 5 to 6 hours of sleep nightly with the clonazepam.  No drowsiness with this the next day.  Rheumatoid arthritis/joint pain: Patient continues with tramadol 2 times daily with.  This is helpful.  She notes no drowsiness with this.  She recently had some bad knee pain that got to 10 out of 10 over the holidays.  She saw orthopedics and had a steroid injection and this was beneficial.  It brought her knee pain down to a 6 out of 10.  She sees physical therapy in February.  Social History   Tobacco Use  Smoking Status Former   Types: Cigarettes   Quit date: 06/19/1995   Years since quitting: 26.9  Smokeless Tobacco Never    Current Outpatient Medications on File Prior to Visit  Medication Sig Dispense Refill   amLODipine (NORVASC) 10 MG tablet TAKE 1 TABLET BY MOUTH EVERY DAY 90 tablet 1   atorvastatin (LIPITOR) 80 MG tablet TAKE 1 TABLET (80 MG TOTAL) BY MOUTH DAILY FOR 180 DOSES. 90 tablet 1   clonazePAM (KLONOPIN) 1 MG tablet TAKE 1 TABLET BY MOUTH EVERYDAY AT BEDTIME 30 tablet 0   cyclobenzaprine (FLEXERIL) 5 MG tablet Take 5 mg by mouth at bedtime as needed.     escitalopram (LEXAPRO) 10 MG tablet Take 1 tablet by mouth daily.     Ferrous Fum-Iron Polysacch (TANDEM PO) Take 106 mg by mouth daily.      fluticasone (FLONASE) 50 MCG/ACT nasal spray Place into the nose at bedtime. Reported on 10/01/2015     hydrochlorothiazide (HYDRODIURIL) 25 MG tablet Take 1 tablet (25 mg total) by mouth daily. 90 tablet 3   hydroxychloroquine (PLAQUENIL) 200 MG tablet Take 200 mg by mouth 2 (two) times daily.     loratadine (CLARITIN) 10 MG tablet TAKE 1 TABLET BY MOUTH EVERY DAY 90 tablet 3   montelukast (SINGULAIR) 10 MG tablet TAKE 1 TABLET BY MOUTH EVERYDAY AT BEDTIME 90 tablet 0   omega-3 acid ethyl esters (LOVAZA) 1 g capsule TAKE 2 CAPSULES BY MOUTH TWICE A DAY 360 capsule 0   omeprazole (PRILOSEC) 20 MG capsule Take by mouth.      potassium chloride SA (K-DUR,KLOR-CON) 20 MEQ tablet Take 1 tablet (20 mEq total) by mouth daily. 6 tablet 0   telmisartan (MICARDIS) 80 MG tablet Take 1 tablet (80 mg total) by mouth daily. 90 tablet 1   traMADol (ULTRAM) 50 MG tablet TAKE 1 TABLET BY MOUTH EVERY 8 HOURS AS NEEDED 90 tablet 0   No current facility-administered medications on file prior to visit.     ROS see history of present illness  Objective  Physical Exam Vitals:   05/20/22 1011 05/20/22 1024  BP: (!) 158/80 Marland Kitchen)  150/78  Pulse: 84   Temp: 98.1 F (36.7 C)   SpO2: 98%     BP Readings from Last 3 Encounters:  05/20/22 (!) 150/78  02/17/22 118/78  01/14/22 (!) 160/80   Wt Readings from Last 3 Encounters:  05/20/22 179 lb 9.6 oz (81.5 kg)  02/17/22 178 lb 3.2 oz (80.8 kg)  01/14/22 180 lb 6.4 oz (81.8 kg)    Physical Exam Constitutional:      General: She is not in acute distress.    Appearance: She is not diaphoretic.  Cardiovascular:     Rate and Rhythm: Normal rate and regular rhythm.     Heart sounds: Normal heart sounds.  Pulmonary:     Effort: Pulmonary effort is normal.     Breath sounds: Normal breath sounds.  Musculoskeletal:     Right lower leg: No edema.     Left lower leg: No edema.  Skin:    General: Skin is warm and dry.  Neurological:     Mental Status: She is  alert.      Assessment/Plan: Please see individual problem list.  Essential hypertension Assessment & Plan: Chronic issue.  Poorly controlled.  Discussed adding additional medication though the patient is hesitant to do this at this time and wants to see if her blood pressure comes down more as her pain in her knee improves.  She will check her blood pressure daily after rest for 10 to 15 minutes.  She will send me a list of her readings in a few weeks.  She will continue amlodipine 10 mg daily, HCTZ 25 mg daily, and telmisartan 80 mg daily.  Orders: -     Comprehensive metabolic panel; Future -     Lipid panel; Future  Rheumatoid arthritis involving multiple sites, unspecified whether rheumatoid factor present [facility]) Assessment & Plan: Chronic issue.  Generally stable.  She can continue tramadol 50 mg every 8 hours as needed for pain.   Insomnia, unspecified type Assessment & Plan: Chronic issue.  Stable.  She can continue lorazepam 1 mg nightly for sleep.   Knee pain, unspecified chronicity, unspecified laterality Assessment & Plan: Improving.  She will continue to see orthopedics.   Prediabetes -     Hemoglobin A1c; Future  Hyperlipidemia, unspecified hyperlipidemia type -     Comprehensive metabolic panel; Future -     Lipid panel; Future  Need for pneumococcal 20-valent conjugate vaccination -     Pneumococcal conjugate vaccine 20-valent     Health Maintenance: Patient reports she has an appointment scheduled for her colonoscopy discussion in the next few weeks.  Prevnar 20 given today.  Discussed getting the Shingrix vaccine at the pharmacy.  Return in about 3 months (around 08/18/2022) for Hypertension/insomnia/chronic pain, labs 2 days prior.   Tommi Rumps, MD [place]

## 2022-05-20 NOTE — Assessment & Plan Note (Signed)
Chronic issue.  Stable.  She can continue lorazepam 1 mg nightly for sleep.

## 2022-05-20 NOTE — Assessment & Plan Note (Signed)
Chronic issue.  Generally stable.  She can continue tramadol 50 mg every 8 hours as needed for pain.

## 2022-05-26 DIAGNOSIS — M0609 Rheumatoid arthritis without rheumatoid factor, multiple sites: Secondary | ICD-10-CM | POA: Diagnosis not present

## 2022-05-26 DIAGNOSIS — M170 Bilateral primary osteoarthritis of knee: Secondary | ICD-10-CM | POA: Diagnosis not present

## 2022-05-26 DIAGNOSIS — M791 Myalgia, unspecified site: Secondary | ICD-10-CM | POA: Diagnosis not present

## 2022-05-26 DIAGNOSIS — Z79899 Other long term (current) drug therapy: Secondary | ICD-10-CM | POA: Diagnosis not present

## 2022-06-02 ENCOUNTER — Other Ambulatory Visit: Payer: Self-pay | Admitting: Family Medicine

## 2022-06-02 NOTE — Telephone Encounter (Signed)
Requesting: Tramadol  Contract: No UDS: No Last Visit: 05/20/2022 Next Visit: 08/21/2022 Last Refill: 04/21/2022  Please Advise

## 2022-06-09 DIAGNOSIS — M25562 Pain in left knee: Secondary | ICD-10-CM | POA: Diagnosis not present

## 2022-06-09 DIAGNOSIS — M25561 Pain in right knee: Secondary | ICD-10-CM | POA: Diagnosis not present

## 2022-06-11 ENCOUNTER — Other Ambulatory Visit: Payer: Self-pay | Admitting: Family Medicine

## 2022-06-11 DIAGNOSIS — M9905 Segmental and somatic dysfunction of pelvic region: Secondary | ICD-10-CM | POA: Diagnosis not present

## 2022-06-11 DIAGNOSIS — M5136 Other intervertebral disc degeneration, lumbar region: Secondary | ICD-10-CM | POA: Diagnosis not present

## 2022-06-11 DIAGNOSIS — M9903 Segmental and somatic dysfunction of lumbar region: Secondary | ICD-10-CM | POA: Diagnosis not present

## 2022-06-11 DIAGNOSIS — M62830 Muscle spasm of back: Secondary | ICD-10-CM | POA: Diagnosis not present

## 2022-06-11 NOTE — Telephone Encounter (Signed)
Requesting: Clonazepam Contract: No UDS: NO Last Visit: 05/20/2022 Next Visit: 08/21/2022 Last Refill: 12/23/2020  Please Advise

## 2022-06-12 DIAGNOSIS — M25562 Pain in left knee: Secondary | ICD-10-CM | POA: Diagnosis not present

## 2022-06-12 DIAGNOSIS — M25561 Pain in right knee: Secondary | ICD-10-CM | POA: Diagnosis not present

## 2022-06-17 DIAGNOSIS — D509 Iron deficiency anemia, unspecified: Secondary | ICD-10-CM | POA: Diagnosis not present

## 2022-06-17 DIAGNOSIS — Z86010 Personal history of colonic polyps: Secondary | ICD-10-CM | POA: Diagnosis not present

## 2022-06-17 DIAGNOSIS — K921 Melena: Secondary | ICD-10-CM | POA: Diagnosis not present

## 2022-06-17 DIAGNOSIS — K219 Gastro-esophageal reflux disease without esophagitis: Secondary | ICD-10-CM | POA: Diagnosis not present

## 2022-06-17 DIAGNOSIS — R1310 Dysphagia, unspecified: Secondary | ICD-10-CM | POA: Diagnosis not present

## 2022-06-18 DIAGNOSIS — M25562 Pain in left knee: Secondary | ICD-10-CM | POA: Diagnosis not present

## 2022-06-18 DIAGNOSIS — M25561 Pain in right knee: Secondary | ICD-10-CM | POA: Diagnosis not present

## 2022-06-23 DIAGNOSIS — M25561 Pain in right knee: Secondary | ICD-10-CM | POA: Diagnosis not present

## 2022-06-23 DIAGNOSIS — M25562 Pain in left knee: Secondary | ICD-10-CM | POA: Diagnosis not present

## 2022-06-25 ENCOUNTER — Inpatient Hospital Stay: Payer: Medicare Other | Attending: Internal Medicine | Admitting: Internal Medicine

## 2022-06-25 ENCOUNTER — Inpatient Hospital Stay: Payer: Medicare Other

## 2022-06-25 VITALS — BP 141/82 | HR 76 | Temp 97.6°F | Resp 18 | Ht 64.0 in | Wt 183.4 lb

## 2022-06-25 DIAGNOSIS — Z880 Allergy status to penicillin: Secondary | ICD-10-CM | POA: Diagnosis not present

## 2022-06-25 DIAGNOSIS — Z883 Allergy status to other anti-infective agents status: Secondary | ICD-10-CM | POA: Diagnosis not present

## 2022-06-25 DIAGNOSIS — Z833 Family history of diabetes mellitus: Secondary | ICD-10-CM | POA: Diagnosis not present

## 2022-06-25 DIAGNOSIS — E785 Hyperlipidemia, unspecified: Secondary | ICD-10-CM | POA: Insufficient documentation

## 2022-06-25 DIAGNOSIS — Z886 Allergy status to analgesic agent status: Secondary | ICD-10-CM | POA: Insufficient documentation

## 2022-06-25 DIAGNOSIS — M25561 Pain in right knee: Secondary | ICD-10-CM | POA: Diagnosis not present

## 2022-06-25 DIAGNOSIS — M25562 Pain in left knee: Secondary | ICD-10-CM | POA: Diagnosis not present

## 2022-06-25 DIAGNOSIS — Z79899 Other long term (current) drug therapy: Secondary | ICD-10-CM | POA: Diagnosis not present

## 2022-06-25 DIAGNOSIS — K219 Gastro-esophageal reflux disease without esophagitis: Secondary | ICD-10-CM | POA: Diagnosis not present

## 2022-06-25 DIAGNOSIS — F32A Depression, unspecified: Secondary | ICD-10-CM | POA: Diagnosis not present

## 2022-06-25 DIAGNOSIS — Z87891 Personal history of nicotine dependence: Secondary | ICD-10-CM | POA: Insufficient documentation

## 2022-06-25 DIAGNOSIS — K30 Functional dyspepsia: Secondary | ICD-10-CM | POA: Insufficient documentation

## 2022-06-25 DIAGNOSIS — Z8349 Family history of other endocrine, nutritional and metabolic diseases: Secondary | ICD-10-CM | POA: Insufficient documentation

## 2022-06-25 DIAGNOSIS — M069 Rheumatoid arthritis, unspecified: Secondary | ICD-10-CM | POA: Insufficient documentation

## 2022-06-25 DIAGNOSIS — E039 Hypothyroidism, unspecified: Secondary | ICD-10-CM | POA: Diagnosis not present

## 2022-06-25 DIAGNOSIS — Z8719 Personal history of other diseases of the digestive system: Secondary | ICD-10-CM | POA: Insufficient documentation

## 2022-06-25 DIAGNOSIS — Z8261 Family history of arthritis: Secondary | ICD-10-CM | POA: Diagnosis not present

## 2022-06-25 DIAGNOSIS — Z9889 Other specified postprocedural states: Secondary | ICD-10-CM

## 2022-06-25 DIAGNOSIS — K648 Other hemorrhoids: Secondary | ICD-10-CM | POA: Diagnosis not present

## 2022-06-25 DIAGNOSIS — Z8379 Family history of other diseases of the digestive system: Secondary | ICD-10-CM | POA: Insufficient documentation

## 2022-06-25 DIAGNOSIS — Z818 Family history of other mental and behavioral disorders: Secondary | ICD-10-CM | POA: Insufficient documentation

## 2022-06-25 DIAGNOSIS — Z90710 Acquired absence of both cervix and uterus: Secondary | ICD-10-CM | POA: Insufficient documentation

## 2022-06-25 DIAGNOSIS — Z809 Family history of malignant neoplasm, unspecified: Secondary | ICD-10-CM | POA: Diagnosis not present

## 2022-06-25 DIAGNOSIS — Z7189 Other specified counseling: Secondary | ICD-10-CM | POA: Insufficient documentation

## 2022-06-25 DIAGNOSIS — M797 Fibromyalgia: Secondary | ICD-10-CM | POA: Diagnosis not present

## 2022-06-25 DIAGNOSIS — Z8711 Personal history of peptic ulcer disease: Secondary | ICD-10-CM | POA: Diagnosis not present

## 2022-06-25 DIAGNOSIS — Z823 Family history of stroke: Secondary | ICD-10-CM | POA: Insufficient documentation

## 2022-06-25 DIAGNOSIS — D509 Iron deficiency anemia, unspecified: Secondary | ICD-10-CM | POA: Diagnosis not present

## 2022-06-25 NOTE — Progress Notes (Signed)
Patient has questions about taking Prilosec.

## 2022-06-25 NOTE — Progress Notes (Signed)
[place]  [phone]) [phone] Fax:(336) [phone]  ID: [name] OB: 28-Jul-1945  MR#: [id number]  CP:[id number]  Patient Care Team: Leone Haven, MD as PCP - General (Family Medicine)  REFERRING PROVIDER: Dr. Caryl Bis  REASON FOR REFERRAL: Iron deficiency anemia  HPI: Audrey Hamilton is a 77 y.o. female with past medical history of rheumatoid arthritis on Plaquenil, GERD, gastric ulcer status post gastric surgery, hyperlipidemia, hypothyroidism, fibromyalgia, depression was referred to hematology for management of iron deficiency anemia.  Patient reports longstanding history of iron deficiency anemia.  She is on slow release iron but still has difficulty tolerating it.  She experiences GI upset.  Has history of gastric ulcers and had gastric surgery in 2000's.  Reports episodes of melanotic stools about 3 times a week. Colonoscopy done in March 2022 showed internal hemorrhoids, diverticula, multiple polyps with biopsies consistent with tubular adenoma.  She is scheduled for repeat colonoscopy and EGD with Dr. Haig Prophet in June 2024.  No NSAIDs use.   REVIEW OF SYSTEMS:   ROS  As per HPI. Otherwise, a complete review of systems is negative.  PAST MEDICAL HISTORY: Past Medical History:  Diagnosis Date   Allergy    Anxiety    controlled   Arthritis    Chicken pox    Colon polyps    Depression    Fibromyalgia    History of hypothyroidism    Hyperlipidemia    Hypokalemia    Multiple gastric ulcers    Rheumatoid arthritis ([place]) Since 1995   currently in a flare up    PAST SURGICAL HISTORY: Past Surgical History:  Procedure Laterality Date   ABDOMINAL HYSTERECTOMY  1974   AUGMENTATION MAMMAPLASTY Bilateral [id number]   silicone   COLONOSCOPY WITH PROPOFOL N/A 07/04/2018   Procedure: COLONOSCOPY WITH PROPOFOL;  Surgeon: Manya Silvas, MD;  Location: [facility];  Service: Endoscopy;  Laterality: N/A;   EYE SURGERY     FOOT  SURGERY Left 11/22/10   MASTECTOMY Bilateral 1970's   SUBCUTANEOUS MASTECTOMY - NO CANCER   SMALL INTESTINE SURGERY  12/2001   STOMACH SURGERY  12/2001   TONSILLECTOMY AND ADENOIDECTOMY  1966   UPPER GASTROINTESTINAL ENDOSCOPY      FAMILY HISTORY: Family History  Problem Relation Age of Onset   Arthritis Mother    Sudden death Mother        after birth of pt who was 60 days old.   Depression Mother    Arthritis Maternal Grandmother    Cancer Maternal Grandmother    Hyperlipidemia Maternal Grandfather    Diabetes Mellitus II Father    Liver disease Father    Rheum arthritis Son    Stroke Son    Breast cancer Neg Hx     HEALTH MAINTENANCE: Social History   Tobacco Use   Smoking status: Former    Types: Cigarettes    Quit date: 06/19/1995    Years since quitting: 27.0   Smokeless tobacco: Never  Vaping Use   Vaping Use: Never used  Substance Use Topics   Alcohol use: Yes    Alcohol/week: 0.0 - 1.0 standard drinks of alcohol    Comment: occ   Drug use: No     Allergies  Allergen Reactions   Amoxicillin-Pot Clavulanate    Aspirin     Aspirin cream only    Baclofen     Made pt feel dizzy    Fluconazole    Oxaprozin     Current Outpatient Medications  Medication Sig Dispense Refill   amLODipine (NORVASC) 10 MG tablet TAKE 1 TABLET BY MOUTH EVERY DAY 90 tablet 1   atorvastatin (LIPITOR) 80 MG tablet TAKE 1 TABLET (80 MG TOTAL) BY MOUTH DAILY FOR 180 DOSES. 90 tablet 1   clonazePAM (KLONOPIN) 1 MG tablet TAKE 1 TABLET BY MOUTH EVERYDAY AT BEDTIME 30 tablet 0   cyclobenzaprine (FLEXERIL) 5 MG tablet Take 5 mg by mouth at bedtime as needed.     escitalopram (LEXAPRO) 10 MG tablet Take 1 tablet by mouth daily.     Ferrous Fum-Iron Polysacch (TANDEM PO) Take 106 mg by mouth daily.     fluticasone (FLONASE) 50 MCG/ACT nasal spray Place into the nose at bedtime. Reported on 10/01/2015     hydrochlorothiazide (HYDRODIURIL) 25 MG tablet Take 1 tablet (25 mg total) by mouth  daily. 90 tablet 3   hydroxychloroquine (PLAQUENIL) 200 MG tablet Take 200 mg by mouth 2 (two) times daily.     loratadine (CLARITIN) 10 MG tablet TAKE 1 TABLET BY MOUTH EVERY DAY 90 tablet 3   montelukast (SINGULAIR) 10 MG tablet TAKE 1 TABLET BY MOUTH EVERYDAY AT BEDTIME 90 tablet 0   omega-3 acid ethyl esters (LOVAZA) 1 g capsule TAKE 2 CAPSULES BY MOUTH TWICE A DAY 360 capsule 0   omeprazole (PRILOSEC) 20 MG capsule Take by mouth.      polyethylene glycol-electrolytes (NULYTELY) 420 g solution Take 4,000 mLs by mouth once.     potassium chloride SA (K-DUR,KLOR-CON) 20 MEQ tablet Take 1 tablet (20 mEq total) by mouth daily. 6 tablet 0   telmisartan (MICARDIS) 80 MG tablet Take 1 tablet (80 mg total) by mouth daily. 90 tablet 1   traMADol (ULTRAM) 50 MG tablet TAKE 1 TABLET BY MOUTH EVERY 8 HOURS AS NEEDED 21 tablet 4   No current facility-administered medications for this visit.    OBJECTIVE: Vitals:   06/25/22 1105  BP: (!) 141/82  Pulse: 76  Resp: 18  Temp: 97.6 F (36.4 C)  SpO2: 97%     Body mass index is 31.48 kg/m.      General: Well-developed, well-nourished, no acute distress. Eyes: Pink conjunctiva, anicteric sclera. HEENT: Normocephalic, moist mucous membranes, clear oropharnyx. Lungs: Clear to auscultation bilaterally. Heart: Regular rate and rhythm. No rubs, murmurs, or gallops. Abdomen: Soft, nontender, nondistended. No organomegaly noted, normoactive bowel sounds. Musculoskeletal: No edema, cyanosis, or clubbing. Neuro: Alert, answering all questions appropriately. Cranial nerves grossly intact. Skin: No rashes or petechiae noted. Psych: Normal affect. Lymphatics: No cervical, calvicular, axillary or inguinal LAD.   LAB RESULTS:  Lab Results  Component Value Date   NA 136 02/17/2022   K 3.7 02/17/2022   CL 97 02/17/2022   CO2 26 02/17/2022   GLUCOSE 122 (H) 02/17/2022   BUN 21 02/17/2022   CREATININE 0.79 02/17/2022   CALCIUM 9.9 02/17/2022   PROT  6.8 07/04/2021   ALBUMIN 4.3 07/04/2021   AST 15 07/04/2021   ALT 17 07/04/2021   ALKPHOS 51 07/04/2021   BILITOT 0.3 07/04/2021   GFRNONAA 101 05/12/2019   GFRAA 117 05/12/2019    Lab Results  Component Value Date   WBC 9.4 02/17/2022   HGB 14.0 02/17/2022   HCT 41.4 02/17/2022   MCV 89.6 02/17/2022   PLT 323.0 02/17/2022    Lab Results  Component Value Date   TIBC 445.2 02/17/2022   TIBC 404.6 01/14/2022   TIBC 407.4 04/08/2021   FERRITIN 14.7 02/17/2022   FERRITIN 21.4 01/14/2022  FERRITIN 23.5 04/08/2021   IRONPCTSAT 13.3 (L) 02/17/2022   IRONPCTSAT 11.9 (L) 01/14/2022   IRONPCTSAT 19.1 (L) 04/08/2021     STUDIES: No results found.  ASSESSMENT AND PLAN:   Zykira TOBY ANDROS is a 77 y.o. female with pmh of rheumatoid arthritis on Plaquenil, GERD, gastric ulcer status post gastric surgery, hyperlipidemia, hypothyroidism, fibromyalgia, depression was referred to hematology for management of iron deficiency anemia.  # Iron deficiency anemia -Not responding to oral iron.  Also difficulty tolerating oral iron due to GI upset.  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  Microscopic GI bleed hemoglobin about.  -I discussed with the patient about IV Venofer weekly x 5 doses.  Occasional side effects such as nausea and back pain was discussed.  Low but potential risk of allergic reaction was also discussed.  Patient advised to stop oral iron.  She has EGD and colonoscopy scheduled with Dr. Haig Prophet in June 2024.  Reports about 3 episodes of melanotic stool in a week.  #H/o gastric surgery -B12 level was normal from October 2023.  Will repeat with next visit for monitoring.  IV Venofer weekly x 5 RTC in 4 months for MD visit, labs, possible Venofer.  Patient expressed understanding and was in agreement with this plan. She also understands that She can call clinic at any time with any questions, concerns, or complaints.   I spent a total of 45  minutes reviewing chart data, face-to-face evaluation with the patient, counseling and coordination of care as detailed above.  Jane Canary, MD   06/25/2022 11:11 AM

## 2022-06-30 DIAGNOSIS — M25562 Pain in left knee: Secondary | ICD-10-CM | POA: Diagnosis not present

## 2022-06-30 DIAGNOSIS — M25561 Pain in right knee: Secondary | ICD-10-CM | POA: Diagnosis not present

## 2022-07-01 ENCOUNTER — Inpatient Hospital Stay: Payer: Medicare Other

## 2022-07-01 VITALS — BP 132/78 | HR 76 | Temp 98.4°F | Resp 18

## 2022-07-01 DIAGNOSIS — M069 Rheumatoid arthritis, unspecified: Secondary | ICD-10-CM | POA: Diagnosis not present

## 2022-07-01 DIAGNOSIS — K219 Gastro-esophageal reflux disease without esophagitis: Secondary | ICD-10-CM | POA: Diagnosis not present

## 2022-07-01 DIAGNOSIS — M797 Fibromyalgia: Secondary | ICD-10-CM | POA: Diagnosis not present

## 2022-07-01 DIAGNOSIS — D509 Iron deficiency anemia, unspecified: Secondary | ICD-10-CM

## 2022-07-01 DIAGNOSIS — E785 Hyperlipidemia, unspecified: Secondary | ICD-10-CM | POA: Diagnosis not present

## 2022-07-01 DIAGNOSIS — F32A Depression, unspecified: Secondary | ICD-10-CM | POA: Diagnosis not present

## 2022-07-01 MED ORDER — SODIUM CHLORIDE 0.9 % IV SOLN
Freq: Once | INTRAVENOUS | Status: AC
  Filled 2022-07-01: qty 250

## 2022-07-01 MED ORDER — SODIUM CHLORIDE 0.9 % IV SOLN
200.0000 mg | Freq: Once | INTRAVENOUS | Status: AC
  Administered 2022-07-01: 200 mg via INTRAVENOUS
  Filled 2022-07-01: qty 10

## 2022-07-02 DIAGNOSIS — M25561 Pain in right knee: Secondary | ICD-10-CM | POA: Diagnosis not present

## 2022-07-02 DIAGNOSIS — M25562 Pain in left knee: Secondary | ICD-10-CM | POA: Diagnosis not present

## 2022-07-03 ENCOUNTER — Inpatient Hospital Stay: Payer: Medicare Other

## 2022-07-03 VITALS — BP 127/51 | HR 69 | Temp 97.8°F | Resp 16

## 2022-07-03 DIAGNOSIS — K219 Gastro-esophageal reflux disease without esophagitis: Secondary | ICD-10-CM | POA: Diagnosis not present

## 2022-07-03 DIAGNOSIS — D509 Iron deficiency anemia, unspecified: Secondary | ICD-10-CM | POA: Diagnosis not present

## 2022-07-03 DIAGNOSIS — M069 Rheumatoid arthritis, unspecified: Secondary | ICD-10-CM | POA: Diagnosis not present

## 2022-07-03 DIAGNOSIS — M797 Fibromyalgia: Secondary | ICD-10-CM | POA: Diagnosis not present

## 2022-07-03 DIAGNOSIS — F32A Depression, unspecified: Secondary | ICD-10-CM | POA: Diagnosis not present

## 2022-07-03 DIAGNOSIS — E785 Hyperlipidemia, unspecified: Secondary | ICD-10-CM | POA: Diagnosis not present

## 2022-07-03 MED ORDER — SODIUM CHLORIDE 0.9 % IV SOLN
200.0000 mg | Freq: Once | INTRAVENOUS | Status: AC
  Administered 2022-07-03: 200 mg via INTRAVENOUS
  Filled 2022-07-03: qty 200

## 2022-07-03 MED ORDER — SODIUM CHLORIDE 0.9 % IV SOLN
Freq: Once | INTRAVENOUS | Status: AC
  Filled 2022-07-03: qty 250

## 2022-07-03 NOTE — Patient Instructions (Signed)

## 2022-07-05 ENCOUNTER — Other Ambulatory Visit: Payer: Self-pay | Admitting: Family Medicine

## 2022-07-05 DIAGNOSIS — E782 Mixed hyperlipidemia: Secondary | ICD-10-CM

## 2022-07-07 ENCOUNTER — Inpatient Hospital Stay: Payer: Medicare Other

## 2022-07-07 DIAGNOSIS — M25562 Pain in left knee: Secondary | ICD-10-CM | POA: Diagnosis not present

## 2022-07-07 DIAGNOSIS — M25561 Pain in right knee: Secondary | ICD-10-CM | POA: Diagnosis not present

## 2022-07-08 MED FILL — Iron Sucrose Inj 20 MG/ML (Fe Equiv): INTRAVENOUS | Qty: 10 | Status: AC

## 2022-07-09 ENCOUNTER — Inpatient Hospital Stay: Payer: Medicare Other

## 2022-07-09 VITALS — BP 153/67 | HR 72 | Temp 98.2°F | Resp 16

## 2022-07-09 DIAGNOSIS — D509 Iron deficiency anemia, unspecified: Secondary | ICD-10-CM | POA: Diagnosis not present

## 2022-07-09 DIAGNOSIS — F32A Depression, unspecified: Secondary | ICD-10-CM | POA: Diagnosis not present

## 2022-07-09 DIAGNOSIS — M797 Fibromyalgia: Secondary | ICD-10-CM | POA: Diagnosis not present

## 2022-07-09 DIAGNOSIS — K219 Gastro-esophageal reflux disease without esophagitis: Secondary | ICD-10-CM | POA: Diagnosis not present

## 2022-07-09 DIAGNOSIS — M5136 Other intervertebral disc degeneration, lumbar region: Secondary | ICD-10-CM | POA: Diagnosis not present

## 2022-07-09 DIAGNOSIS — M25562 Pain in left knee: Secondary | ICD-10-CM | POA: Diagnosis not present

## 2022-07-09 DIAGNOSIS — M9905 Segmental and somatic dysfunction of pelvic region: Secondary | ICD-10-CM | POA: Diagnosis not present

## 2022-07-09 DIAGNOSIS — M62830 Muscle spasm of back: Secondary | ICD-10-CM | POA: Diagnosis not present

## 2022-07-09 DIAGNOSIS — M069 Rheumatoid arthritis, unspecified: Secondary | ICD-10-CM | POA: Diagnosis not present

## 2022-07-09 DIAGNOSIS — M25561 Pain in right knee: Secondary | ICD-10-CM | POA: Diagnosis not present

## 2022-07-09 DIAGNOSIS — E785 Hyperlipidemia, unspecified: Secondary | ICD-10-CM | POA: Diagnosis not present

## 2022-07-09 DIAGNOSIS — M9903 Segmental and somatic dysfunction of lumbar region: Secondary | ICD-10-CM | POA: Diagnosis not present

## 2022-07-09 MED ORDER — SODIUM CHLORIDE 0.9 % IV SOLN
200.0000 mg | Freq: Once | INTRAVENOUS | Status: AC
  Administered 2022-07-09: 200 mg via INTRAVENOUS
  Filled 2022-07-09: qty 10

## 2022-07-09 MED ORDER — SODIUM CHLORIDE 0.9 % IV SOLN
Freq: Once | INTRAVENOUS | Status: AC
  Filled 2022-07-09: qty 250

## 2022-07-13 ENCOUNTER — Other Ambulatory Visit: Payer: Self-pay | Admitting: Family Medicine

## 2022-07-13 ENCOUNTER — Inpatient Hospital Stay: Payer: Medicare Other

## 2022-07-13 VITALS — BP 144/76 | HR 87 | Temp 98.3°F | Resp 16

## 2022-07-13 DIAGNOSIS — F32A Depression, unspecified: Secondary | ICD-10-CM | POA: Diagnosis not present

## 2022-07-13 DIAGNOSIS — M797 Fibromyalgia: Secondary | ICD-10-CM | POA: Diagnosis not present

## 2022-07-13 DIAGNOSIS — D509 Iron deficiency anemia, unspecified: Secondary | ICD-10-CM | POA: Diagnosis not present

## 2022-07-13 DIAGNOSIS — E785 Hyperlipidemia, unspecified: Secondary | ICD-10-CM | POA: Diagnosis not present

## 2022-07-13 DIAGNOSIS — M069 Rheumatoid arthritis, unspecified: Secondary | ICD-10-CM | POA: Diagnosis not present

## 2022-07-13 DIAGNOSIS — K219 Gastro-esophageal reflux disease without esophagitis: Secondary | ICD-10-CM | POA: Diagnosis not present

## 2022-07-13 MED ORDER — SODIUM CHLORIDE 0.9 % IV SOLN
Freq: Once | INTRAVENOUS | Status: AC
  Filled 2022-07-13: qty 250

## 2022-07-13 MED ORDER — SODIUM CHLORIDE 0.9 % IV SOLN
200.0000 mg | Freq: Once | INTRAVENOUS | Status: AC
  Administered 2022-07-13: 200 mg via INTRAVENOUS
  Filled 2022-07-13: qty 10

## 2022-07-16 ENCOUNTER — Inpatient Hospital Stay: Payer: Medicare Other

## 2022-07-16 VITALS — BP 138/71 | HR 72 | Temp 97.6°F | Resp 18

## 2022-07-16 DIAGNOSIS — D509 Iron deficiency anemia, unspecified: Secondary | ICD-10-CM

## 2022-07-16 DIAGNOSIS — F32A Depression, unspecified: Secondary | ICD-10-CM | POA: Diagnosis not present

## 2022-07-16 DIAGNOSIS — K219 Gastro-esophageal reflux disease without esophagitis: Secondary | ICD-10-CM | POA: Diagnosis not present

## 2022-07-16 DIAGNOSIS — M797 Fibromyalgia: Secondary | ICD-10-CM | POA: Diagnosis not present

## 2022-07-16 DIAGNOSIS — E785 Hyperlipidemia, unspecified: Secondary | ICD-10-CM | POA: Diagnosis not present

## 2022-07-16 DIAGNOSIS — M069 Rheumatoid arthritis, unspecified: Secondary | ICD-10-CM | POA: Diagnosis not present

## 2022-07-16 MED ORDER — SODIUM CHLORIDE 0.9 % IV SOLN
Freq: Once | INTRAVENOUS | Status: AC
  Filled 2022-07-16: qty 250

## 2022-07-16 MED ORDER — SODIUM CHLORIDE 0.9 % IV SOLN
200.0000 mg | Freq: Once | INTRAVENOUS | Status: AC
  Administered 2022-07-16: 200 mg via INTRAVENOUS
  Filled 2022-07-16: qty 200

## 2022-07-16 NOTE — Patient Instructions (Signed)

## 2022-07-22 ENCOUNTER — Other Ambulatory Visit: Payer: Self-pay | Admitting: Family Medicine

## 2022-07-24 NOTE — Telephone Encounter (Signed)
Requesting: Tramadol 50MG  Contract: No UDS: no Last Visit: 05/20/2022 Next Visit: 08/21/2022 Last Refill: 06/02/22  Please Advise

## 2022-07-28 ENCOUNTER — Other Ambulatory Visit: Payer: Self-pay | Admitting: Family Medicine

## 2022-07-28 DIAGNOSIS — I10 Essential (primary) hypertension: Secondary | ICD-10-CM

## 2022-08-06 DIAGNOSIS — M9905 Segmental and somatic dysfunction of pelvic region: Secondary | ICD-10-CM | POA: Diagnosis not present

## 2022-08-06 DIAGNOSIS — M62830 Muscle spasm of back: Secondary | ICD-10-CM | POA: Diagnosis not present

## 2022-08-06 DIAGNOSIS — M5136 Other intervertebral disc degeneration, lumbar region: Secondary | ICD-10-CM | POA: Diagnosis not present

## 2022-08-06 DIAGNOSIS — M9903 Segmental and somatic dysfunction of lumbar region: Secondary | ICD-10-CM | POA: Diagnosis not present

## 2022-08-11 ENCOUNTER — Other Ambulatory Visit: Payer: Self-pay | Admitting: Family Medicine

## 2022-08-11 NOTE — Telephone Encounter (Signed)
LOV: 05/20/2022   NOV: 08/21/2022

## 2022-08-13 ENCOUNTER — Other Ambulatory Visit: Payer: Self-pay | Admitting: Family Medicine

## 2022-08-13 DIAGNOSIS — I10 Essential (primary) hypertension: Secondary | ICD-10-CM | POA: Diagnosis not present

## 2022-08-13 DIAGNOSIS — Z79899 Other long term (current) drug therapy: Secondary | ICD-10-CM | POA: Diagnosis not present

## 2022-08-13 DIAGNOSIS — M0560 Rheumatoid arthritis of unspecified site with involvement of other organs and systems: Secondary | ICD-10-CM | POA: Diagnosis not present

## 2022-08-13 DIAGNOSIS — H524 Presbyopia: Secondary | ICD-10-CM | POA: Diagnosis not present

## 2022-08-13 DIAGNOSIS — Z961 Presence of intraocular lens: Secondary | ICD-10-CM | POA: Diagnosis not present

## 2022-08-13 DIAGNOSIS — H02834 Dermatochalasis of left upper eyelid: Secondary | ICD-10-CM | POA: Diagnosis not present

## 2022-08-13 DIAGNOSIS — E782 Mixed hyperlipidemia: Secondary | ICD-10-CM

## 2022-08-13 DIAGNOSIS — H02831 Dermatochalasis of right upper eyelid: Secondary | ICD-10-CM | POA: Diagnosis not present

## 2022-08-13 DIAGNOSIS — H35033 Hypertensive retinopathy, bilateral: Secondary | ICD-10-CM | POA: Diagnosis not present

## 2022-08-17 ENCOUNTER — Other Ambulatory Visit: Payer: Self-pay | Admitting: Family Medicine

## 2022-08-17 NOTE — Telephone Encounter (Signed)
Was last refilled on 07/24/22 for #21 with 1 refill.  LOV: 1.31.24  NOV: 5.3.24

## 2022-08-18 ENCOUNTER — Other Ambulatory Visit (INDEPENDENT_AMBULATORY_CARE_PROVIDER_SITE_OTHER): Payer: Medicare Other

## 2022-08-18 DIAGNOSIS — E785 Hyperlipidemia, unspecified: Secondary | ICD-10-CM | POA: Diagnosis not present

## 2022-08-18 DIAGNOSIS — I10 Essential (primary) hypertension: Secondary | ICD-10-CM | POA: Diagnosis not present

## 2022-08-18 DIAGNOSIS — R7303 Prediabetes: Secondary | ICD-10-CM

## 2022-08-18 LAB — COMPREHENSIVE METABOLIC PANEL
ALT: 28 U/L (ref 0–35)
AST: 23 U/L (ref 0–37)
Albumin: 4.1 g/dL (ref 3.5–5.2)
Alkaline Phosphatase: 61 U/L (ref 39–117)
BUN: 16 mg/dL (ref 6–23)
CO2: 26 mEq/L (ref 19–32)
Calcium: 9.6 mg/dL (ref 8.4–10.5)
Chloride: 103 mEq/L (ref 96–112)
Creatinine, Ser: 0.66 mg/dL (ref 0.40–1.20)
GFR: 85.33 mL/min (ref >60.00)
Glucose, Bld: 109 mg/dL — ABNORMAL HIGH (ref 70–99)
Potassium: 3.6 mEq/L (ref 3.5–5.1)
Sodium: 139 mEq/L (ref 135–145)
Total Bilirubin: 0.2 mg/dL (ref 0.2–1.2)
Total Protein: 6.3 g/dL (ref 6.0–8.3)

## 2022-08-18 LAB — LIPID PANEL
Cholesterol: 174 mg/dL (ref 0–200)
HDL: 65 mg/dL (ref >39.00)
LDL Cholesterol: 73 mg/dL (ref 0–99)
NonHDL: 108.8
Total CHOL/HDL Ratio: 3
Triglycerides: 181 mg/dL — ABNORMAL HIGH (ref 0.0–149.0)
VLDL: 36.2 mg/dL (ref 0.0–40.0)

## 2022-08-18 LAB — HEMOGLOBIN A1C: Hgb A1c MFr Bld: 5.8 % (ref 4.6–6.5)

## 2022-08-21 ENCOUNTER — Encounter: Payer: Self-pay | Admitting: Family Medicine

## 2022-08-21 ENCOUNTER — Ambulatory Visit (INDEPENDENT_AMBULATORY_CARE_PROVIDER_SITE_OTHER): Payer: Medicare Other | Admitting: Family Medicine

## 2022-08-21 VITALS — BP 132/70 | HR 89 | Temp 98.0°F | Ht 64.0 in | Wt 180.8 lb

## 2022-08-21 DIAGNOSIS — D509 Iron deficiency anemia, unspecified: Secondary | ICD-10-CM | POA: Diagnosis not present

## 2022-08-21 DIAGNOSIS — I10 Essential (primary) hypertension: Secondary | ICD-10-CM

## 2022-08-21 DIAGNOSIS — M069 Rheumatoid arthritis, unspecified: Secondary | ICD-10-CM

## 2022-08-21 DIAGNOSIS — K219 Gastro-esophageal reflux disease without esophagitis: Secondary | ICD-10-CM

## 2022-08-21 DIAGNOSIS — G4700 Insomnia, unspecified: Secondary | ICD-10-CM | POA: Diagnosis not present

## 2022-08-21 MED ORDER — PANTOPRAZOLE SODIUM 40 MG PO TBEC
DELAYED_RELEASE_TABLET | ORAL | 3 refills | Status: DC
Start: 2022-08-21 — End: 2022-08-29

## 2022-08-21 MED ORDER — CARVEDILOL 3.125 MG PO TABS
3.1250 mg | ORAL_TABLET | Freq: Two times a day (BID) | ORAL | 3 refills | Status: DC
Start: 2022-08-21 — End: 2022-11-16

## 2022-08-21 NOTE — Assessment & Plan Note (Signed)
Chronic issue.  Generally stable.  She will continue to see rheumatology.  She can continue tramadol 50 mg every 8 hours as needed for pain.

## 2022-08-21 NOTE — Assessment & Plan Note (Signed)
Chronic issue.  Uncontrolled at home.  We will add carvedilol 3.125 mg twice daily.  She will continue amlodipine 10 mg daily, HCTZ 25 mg daily, and telmisartan 80 mg daily.  She will monitor for excessive fatigue with the carvedilol.  She will continue to monitor her home blood pressures.

## 2022-08-21 NOTE — Assessment & Plan Note (Signed)
GI workup planned with colonoscopy and endoscopy scheduled for June.

## 2022-08-21 NOTE — Assessment & Plan Note (Signed)
Chronic issue.  Patient can continue clonazepam 1 mg nightly as needed for sleep.

## 2022-08-21 NOTE — Assessment & Plan Note (Signed)
Patient reports she is scheduled for an endoscopy.  We will trial Protonix 40 mg twice daily for 14 days and then 40 mg daily.

## 2022-08-21 NOTE — Progress Notes (Signed)
[name], MD Phone: [phone]  Audrey Hamilton is a 77 y.o. female who presents today for f/u.  HYPERTENSION Disease Monitoring Home BP Monitoring 130s-140s/60s-80s Chest pain- no    Dyspnea- no Medications Compliance-  taking amlodipine, HCTZ, telmisartan.   Edema- no BMET    Component Value Date/Time   NA 139 08/18/2022 0944   NA 141 05/12/2019 1440   K 3.6 08/18/2022 0944   CL 103 08/18/2022 0944   CO2 26 08/18/2022 0944   GLUCOSE 109 (H) 08/18/2022 0944   BUN 16 08/18/2022 0944   BUN 13 05/12/2019 1440   CREATININE 0.66 08/18/2022 0944   CALCIUM 9.6 08/18/2022 0944   GFRNONAA 101 05/12/2019 1440   GFRAA 117 05/12/2019 1440   GERD:   Reflux symptoms: yes, burning     Blood in stool: no  Dysphagia: yes   EGD: scheduled  Medication: omeprazole 20 mg BID  Insomnia: Taking clonazepam.  Gets 6 hours of sleep nightly.  No drowsiness with this.  Rheumatoid arthritis: Continues to follow with rheumatology.  She is on Plaquenil prescribed by them.  She takes tramadol typically once a day to help with pain.  No drowsiness with this.   Social History   Tobacco Use  Smoking Status Former   Types: Cigarettes   Quit date: 06/19/1995   Years since quitting: 27.1  Smokeless Tobacco Never    Current Outpatient Medications on File Prior to Visit  Medication Sig Dispense Refill   amLODipine (NORVASC) 10 MG tablet TAKE 1 TABLET BY MOUTH EVERY DAY 90 tablet 1   atorvastatin (LIPITOR) 80 MG tablet TAKE 1 TABLET (80 MG TOTAL) BY MOUTH DAILY FOR 180 DOSES. 90 tablet 3   clonazePAM (KLONOPIN) 1 MG tablet TAKE 1 TABLET BY MOUTH EVERYDAY AT BEDTIME 30 tablet 0   cyclobenzaprine (FLEXERIL) 5 MG tablet Take 5 mg by mouth at bedtime as needed.     escitalopram (LEXAPRO) 10 MG tablet Take 1 tablet by mouth daily.     fluticasone (FLONASE) 50 MCG/ACT nasal spray Place into the nose at bedtime. Reported on 10/01/2015     hydrochlorothiazide (HYDRODIURIL) 25 MG tablet Take 1  tablet (25 mg total) by mouth daily. 90 tablet 3   hydroxychloroquine (PLAQUENIL) 200 MG tablet Take 200 mg by mouth 2 (two) times daily.     loratadine (CLARITIN) 10 MG tablet TAKE 1 TABLET BY MOUTH EVERY DAY 90 tablet 3   montelukast (SINGULAIR) 10 MG tablet TAKE 1 TABLET BY MOUTH EVERYDAY AT BEDTIME 90 tablet 0   omega-3 acid ethyl esters (LOVAZA) 1 g capsule TAKE 2 CAPSULES BY MOUTH TWICE A DAY 360 capsule 0   polyethylene glycol-electrolytes (NULYTELY) 420 g solution Take 4,000 mLs by mouth once.     potassium chloride SA (K-DUR,KLOR-CON) 20 MEQ tablet Take 1 tablet (20 mEq total) by mouth daily. 6 tablet 0   telmisartan (MICARDIS) 80 MG tablet Take 1 tablet (80 mg total) by mouth daily. 90 tablet 1   traMADol (ULTRAM) 50 MG tablet TAKE 1 TABLET BY MOUTH EVERY 8 HOURS AS NEEDED 21 tablet 2   No current facility-administered medications on file prior to visit.     ROS see history of present illness  Objective  Physical Exam Vitals:   08/21/22 1128  BP: 132/70  Pulse: 89  Temp: 98 F (36.7 C)  SpO2: 97%    BP Readings from Last 3 Encounters:  08/21/22 132/70  07/16/22 138/71  07/13/22 (!) 144/76   Wt Readings  from Last 3 Encounters:  08/21/22 180 lb 12.8 oz (82 kg)  06/25/22 183 lb 6.4 oz (83.2 kg)  05/20/22 179 lb 9.6 oz (81.5 kg)    Physical Exam Constitutional:      General: She is not in acute distress.    Appearance: She is not diaphoretic.  Cardiovascular:     Rate and Rhythm: Normal rate and regular rhythm.     Heart sounds: Normal heart sounds.  Pulmonary:     Effort: Pulmonary effort is normal.     Breath sounds: Normal breath sounds.  Skin:    General: Skin is warm and dry.  Neurological:     Mental Status: She is alert.      Assessment/Plan: Please see individual problem list.  Essential hypertension Assessment & Plan: Chronic issue.  Uncontrolled at home.  We will add carvedilol 3.125 mg twice daily.  She will continue amlodipine 10 mg  daily, HCTZ 25 mg daily, and telmisartan 80 mg daily.  She will monitor for excessive fatigue with the carvedilol.  She will continue to monitor her home blood pressures.  Orders: -     Carvedilol; Take 1 tablet (3.125 mg total) by mouth 2 (two) times daily with a meal.  Dispense: 60 tablet; Refill: 3  Insomnia, unspecified type Assessment & Plan: Chronic issue.  Patient can continue clonazepam 1 mg nightly as needed for sleep.   Rheumatoid arthritis involving multiple sites, unspecified whether rheumatoid factor present [facility]) Assessment & Plan: Chronic issue.  Generally stable.  She will continue to see rheumatology.  She can continue tramadol 50 mg every 8 hours as needed for pain.   Gastroesophageal reflux disease, unspecified whether esophagitis present Assessment & Plan: Patient reports she is scheduled for an endoscopy.  We will trial Protonix 40 mg twice daily for 14 days and then 40 mg daily.  Orders: -     Pantoprazole Sodium; Take 1 tablet (40 mg total) by mouth 2 (two) times daily before a meal for 14 days, THEN 1 tablet (40 mg total) daily before breakfast.  Dispense: 30 tablet; Refill: 3  Iron deficiency anemia, unspecified iron deficiency anemia type Assessment & Plan: GI workup planned with colonoscopy and endoscopy scheduled for June.     Return in about 3 months (around 11/21/2022) for Hypertension.   [name], MD [facility]

## 2022-08-21 NOTE — Patient Instructions (Signed)
Nice to see you. We are going to start you on carvedilol 3.125 mg twice daily to see if this will help with your blood pressure.  If you develop fatigue after starting the carvedilol please let us know. I have also sent in Protonix for you to take 40 mg twice daily 30 minutes before breakfast and dinner.  You will not take omeprazole once you start on this.  Once you finish the 14-day course of the twice daily Protonix you will go to once daily 30 minutes before breakfast.

## 2022-08-28 ENCOUNTER — Other Ambulatory Visit: Payer: Self-pay | Admitting: Family Medicine

## 2022-08-28 DIAGNOSIS — K219 Gastro-esophageal reflux disease without esophagitis: Secondary | ICD-10-CM

## 2022-09-09 DIAGNOSIS — M0560 Rheumatoid arthritis of unspecified site with involvement of other organs and systems: Secondary | ICD-10-CM | POA: Diagnosis not present

## 2022-09-09 DIAGNOSIS — Z79899 Other long term (current) drug therapy: Secondary | ICD-10-CM | POA: Diagnosis not present

## 2022-09-10 DIAGNOSIS — M9903 Segmental and somatic dysfunction of lumbar region: Secondary | ICD-10-CM | POA: Diagnosis not present

## 2022-09-10 DIAGNOSIS — M5136 Other intervertebral disc degeneration, lumbar region: Secondary | ICD-10-CM | POA: Diagnosis not present

## 2022-09-10 DIAGNOSIS — M9905 Segmental and somatic dysfunction of pelvic region: Secondary | ICD-10-CM | POA: Diagnosis not present

## 2022-09-10 DIAGNOSIS — M62830 Muscle spasm of back: Secondary | ICD-10-CM | POA: Diagnosis not present

## 2022-09-11 ENCOUNTER — Other Ambulatory Visit: Payer: Self-pay | Admitting: Family Medicine

## 2022-09-21 ENCOUNTER — Encounter: Payer: Self-pay | Admitting: *Deleted

## 2022-09-22 ENCOUNTER — Ambulatory Visit: Payer: Medicare Other | Admitting: Anesthesiology

## 2022-09-22 ENCOUNTER — Encounter: Payer: Self-pay | Admitting: *Deleted

## 2022-09-22 ENCOUNTER — Encounter
Admission: RE | Disposition: A | Discharge status: Home / Self Care | Payer: Self-pay | Source: Home / Self Care | Attending: Gastroenterology

## 2022-09-22 ENCOUNTER — Other Ambulatory Visit: Payer: Self-pay

## 2022-09-22 ENCOUNTER — Ambulatory Visit
Admission: RE | Admit: 2022-09-22 | Discharge: 2022-09-22 | Disposition: A | Discharge status: Home / Self Care | Payer: Medicare Other | Attending: Gastroenterology | Admitting: Gastroenterology

## 2022-09-22 DIAGNOSIS — E039 Hypothyroidism, unspecified: Secondary | ICD-10-CM | POA: Insufficient documentation

## 2022-09-22 DIAGNOSIS — Z980 Intestinal bypass and anastomosis status: Secondary | ICD-10-CM | POA: Diagnosis not present

## 2022-09-22 DIAGNOSIS — M797 Fibromyalgia: Secondary | ICD-10-CM | POA: Insufficient documentation

## 2022-09-22 DIAGNOSIS — F32A Depression, unspecified: Secondary | ICD-10-CM | POA: Diagnosis not present

## 2022-09-22 DIAGNOSIS — Z87891 Personal history of nicotine dependence: Secondary | ICD-10-CM | POA: Insufficient documentation

## 2022-09-22 DIAGNOSIS — K635 Polyp of colon: Secondary | ICD-10-CM | POA: Insufficient documentation

## 2022-09-22 DIAGNOSIS — K2960 Other gastritis without bleeding: Secondary | ICD-10-CM | POA: Diagnosis not present

## 2022-09-22 DIAGNOSIS — K2970 Gastritis, unspecified, without bleeding: Secondary | ICD-10-CM | POA: Insufficient documentation

## 2022-09-22 DIAGNOSIS — K640 First degree hemorrhoids: Secondary | ICD-10-CM | POA: Insufficient documentation

## 2022-09-22 DIAGNOSIS — I10 Essential (primary) hypertension: Secondary | ICD-10-CM | POA: Diagnosis not present

## 2022-09-22 DIAGNOSIS — Z9884 Bariatric surgery status: Secondary | ICD-10-CM | POA: Diagnosis not present

## 2022-09-22 DIAGNOSIS — D509 Iron deficiency anemia, unspecified: Secondary | ICD-10-CM | POA: Insufficient documentation

## 2022-09-22 DIAGNOSIS — F419 Anxiety disorder, unspecified: Secondary | ICD-10-CM | POA: Insufficient documentation

## 2022-09-22 DIAGNOSIS — K219 Gastro-esophageal reflux disease without esophagitis: Secondary | ICD-10-CM | POA: Insufficient documentation

## 2022-09-22 DIAGNOSIS — R1310 Dysphagia, unspecified: Secondary | ICD-10-CM | POA: Diagnosis not present

## 2022-09-22 DIAGNOSIS — Z79899 Other long term (current) drug therapy: Secondary | ICD-10-CM | POA: Diagnosis not present

## 2022-09-22 DIAGNOSIS — Z86010 Personal history of colonic polyps: Secondary | ICD-10-CM | POA: Insufficient documentation

## 2022-09-22 DIAGNOSIS — M069 Rheumatoid arthritis, unspecified: Secondary | ICD-10-CM | POA: Insufficient documentation

## 2022-09-22 DIAGNOSIS — K649 Unspecified hemorrhoids: Secondary | ICD-10-CM | POA: Diagnosis not present

## 2022-09-22 DIAGNOSIS — E785 Hyperlipidemia, unspecified: Secondary | ICD-10-CM | POA: Insufficient documentation

## 2022-09-22 HISTORY — PX: COLONOSCOPY WITH PROPOFOL: SHX5780

## 2022-09-22 HISTORY — PX: ESOPHAGOGASTRODUODENOSCOPY (EGD) WITH PROPOFOL: SHX5813

## 2022-09-22 SURGERY — COLONOSCOPY WITH PROPOFOL
Anesthesia: General

## 2022-09-22 MED ORDER — PROPOFOL 10 MG/ML IV BOLUS
INTRAVENOUS | Status: AC
  Filled 2022-09-22: qty 20

## 2022-09-22 MED ORDER — GLYCOPYRROLATE 0.2 MG/ML IJ SOLN
INTRAMUSCULAR | Status: DC | PRN
  Administered 2022-09-22: .2 mg via INTRAVENOUS

## 2022-09-22 MED ORDER — SODIUM CHLORIDE 0.9 % IV SOLN: INTRAVENOUS | Status: DC

## 2022-09-22 MED ORDER — LIDOCAINE HCL (CARDIAC) PF 100 MG/5ML IV SOSY
PREFILLED_SYRINGE | INTRAVENOUS | Status: DC | PRN
  Administered 2022-09-22: 20 mg via INTRAVENOUS

## 2022-09-22 MED ORDER — PROPOFOL 500 MG/50ML IV EMUL
INTRAVENOUS | Status: DC | PRN
  Administered 2022-09-22: 20 mg via INTRAVENOUS
  Administered 2022-09-22: 150 ug/kg/min via INTRAVENOUS
  Administered 2022-09-22: 20 mg via INTRAVENOUS
  Administered 2022-09-22: 50 mg via INTRAVENOUS

## 2022-09-22 MED ORDER — ONDANSETRON HCL 4 MG/2ML IJ SOLN
INTRAMUSCULAR | Status: DC | PRN
  Administered 2022-09-22: 4 mg via INTRAVENOUS

## 2022-09-22 MED ORDER — PROPOFOL 1000 MG/100ML IV EMUL
INTRAVENOUS | Status: AC
  Filled 2022-09-22: qty 100

## 2022-09-22 MED ORDER — SODIUM CHLORIDE 0.9 % IV SOLN: INTRAVENOUS | Status: DC | PRN

## 2022-09-22 NOTE — Op Note (Signed)
[facility] Patient Name: Audrey Hamilton Procedure Date: 09/22/2022 10:28 AM MRN: [id number] Account #: [id number] Date of Birth: June 02, 1945 Admit Type: Outpatient Age: 77 Room: [facility] ENDO ROOM 1 Gender: Female Note Status: Finalized Instrument Name: Prentice Docker [id number] Procedure:             Colonoscopy Indications:           Iron deficiency anemia Providers:             Eather Colas MD, MD Medicines:             Monitored Anesthesia Care Complications:         No immediate complications. Estimated blood loss:                         Minimal. Procedure:             Pre-Anesthesia Assessment:                        - Prior to the procedure, a History and Physical was                         performed, and patient medications and allergies were                         reviewed. The patient is competent. The risks and                         benefits of the procedure and the sedation options and                         risks were discussed with the patient. All questions                         were answered and informed consent was obtained.                         Patient identification and proposed procedure were                         verified by the physician, the nurse, the                         anesthesiologist, the anesthetist and the technician                         in the endoscopy suite. Mental Status Examination:                         alert and oriented. Airway Examination: normal                         oropharyngeal airway and neck mobility. Respiratory                         Examination: clear to auscultation. CV Examination:                         normal. Prophylactic Antibiotics: The patient does not  require prophylactic antibiotics. Prior                         Anticoagulants: The patient has taken no anticoagulant                         or antiplatelet agents. ASA Grade Assessment: III - A                          patient with severe systemic disease. After reviewing                         the risks and benefits, the patient was deemed in                         satisfactory condition to undergo the procedure. The                         anesthesia plan was to use monitored anesthesia care                         (MAC). Immediately prior to administration of                         medications, the patient was re-assessed for adequacy                         to receive sedatives. The heart rate, respiratory                         rate, oxygen saturations, blood pressure, adequacy of                         pulmonary ventilation, and response to care were                         monitored throughout the procedure. The physical                         status of the patient was re-assessed after the                         procedure.                        After obtaining informed consent, the colonoscope was                         passed under direct vision. Throughout the procedure,                         the patient's blood pressure, pulse, and oxygen                         saturations were monitored continuously. The                         Colonoscope was introduced through the anus and  advanced to the the terminal ileum. The colonoscopy                         was performed without difficulty. The patient                         tolerated the procedure well. The quality of the bowel                         preparation was fair. The ileocecal valve, appendiceal                         orifice, and rectum were photographed. Findings:      The perianal and digital rectal examinations were normal.      The terminal ileum appeared normal.      A 3 mm polyp was found in the ileocecal valve. The polyp was sessile.       The polyp was removed with a cold snare. Resection and retrieval were       complete. Estimated blood loss was minimal.       Internal hemorrhoids were found during retroflexion. The hemorrhoids       were Grade I (internal hemorrhoids that do not prolapse).      The exam was otherwise without abnormality on direct and retroflexion       views. Impression:            - Preparation of the colon was fair.                        - The examined portion of the ileum was normal.                        - One 3 mm polyp at the ileocecal valve, removed with                         a cold snare. Resected and retrieved.                        - Internal hemorrhoids.                        - The examination was otherwise normal on direct and                         retroflexion views. Recommendation:        - Discharge patient to home.                        - Resume previous diet.                        - Continue present medications.                        - Await pathology results.                        - Repeat colonoscopy in 1-2 years because the bowel  preparation was suboptimal. Given age > 30, will need                         to discuss the risks/benefits of further surveillance                         colonoscopies.                        - Return to referring physician as previously                         scheduled. Procedure Code(s):     --- Professional ---                        [phone], Colonoscopy, flexible; with removal of                         tumor(s), polyp(s), or other lesion(s) by snare                         technique Diagnosis Code(s):     --- Professional ---                        K64.0, First degree hemorrhoids                        D12.0, Benign neoplasm of cecum                        D50.9, Iron deficiency anemia, unspecified CPT copyright 2022 American [employer] Association. All rights reserved. The codes documented in this report are preliminary and upon coder review may  be revised to meet current compliance requirements. Eather Colas MD, MD 09/22/2022  11:05:13 AM Number of Addenda: 0 Note Initiated On: 09/22/2022 10:28 AM Scope Withdrawal Time: 0 hours 7 minutes 18 seconds  Total Procedure Duration: 0 hours 16 minutes 35 seconds  Estimated Blood Loss:  Estimated blood loss was minimal.      [facility]

## 2022-09-22 NOTE — Anesthesia Preprocedure Evaluation (Signed)
Anesthesia Evaluation  Patient identified by MRN, date of birth, ID band Patient awake    Reviewed: Allergy & Precautions, H&P , NPO status , Patient's Chart, lab work & pertinent test results  History of Anesthesia Complications Negative for: history of anesthetic complications  Airway Mallampati: II  TM Distance: >3 FB     Dental  (+) Upper Dentures, Lower Dentures   Pulmonary neg COPD, former smoker   breath sounds clear to auscultation       Cardiovascular hypertension, Pt. on medications (-) angina (-) Past MI, (-) Cardiac Stents and (-) CABG (-) dysrhythmias  Rhythm:Regular Rate:Normal     Neuro/Psych  PSYCHIATRIC DISORDERS Anxiety Depression     Neuromuscular disease    GI/Hepatic Neg liver ROS, PUD,GERD  Medicated and Controlled,,  Endo/Other  negative endocrine ROS    Renal/GU negative Renal ROS  negative genitourinary   Musculoskeletal  (+) Arthritis , Rheumatoid disorders,  Fibromyalgia -  Abdominal   Peds  Hematology  (+) Blood dyscrasia, anemia   Anesthesia Other Findings Past Medical History: No date: Allergy No date: Anxiety     Comment:  controlled No date: Arthritis No date: Chicken pox No date: Colon polyps No date: Depression No date: Fibromyalgia No date: History of hypothyroidism No date: Hyperlipidemia No date: Hypokalemia No date: Multiple gastric ulcers Since 1995: Rheumatoid arthritis (HCC)     Comment:  currently in a flare up  Past Surgical History: 1974: ABDOMINAL HYSTERECTOMY 1970's: AUGMENTATION MAMMAPLASTY; Bilateral     Comment:  silicone No date: EYE SURGERY 11/22/10: FOOT SURGERY; Left 1970's: MASTECTOMY; Bilateral     Comment:  SUBCUTANEOUS MASTECTOMY - NO CANCER 12/2001: SMALL INTESTINE SURGERY 12/2001: STOMACH SURGERY 1966: TONSILLECTOMY AND ADENOIDECTOMY No date: UPPER GASTROINTESTINAL ENDOSCOPY     Reproductive/Obstetrics negative OB ROS                              Anesthesia Physical Anesthesia Plan  ASA: 3  Anesthesia Plan: General   Post-op Pain Management: Minimal or no pain anticipated   Induction: Intravenous  PONV Risk Score and Plan: 3 and Propofol infusion and TIVA  Airway Management Planned: Nasal Cannula  Additional Equipment: None  Intra-op Plan:   Post-operative Plan:   Informed Consent: I have reviewed the patients History and Physical, chart, labs and discussed the procedure including the risks, benefits and alternatives for the proposed anesthesia with the patient or authorized representative who has indicated his/her understanding and acceptance.     Dental Advisory Given  Plan Discussed with: Anesthesiologist and CRNA  Anesthesia Plan Comments: (Discussed risks of anesthesia with patient, including possibility of difficulty with spontaneous ventilation under anesthesia necessitating airway intervention, PONV, and rare risks such as cardiac or respiratory or neurological events, and allergic reactions. Discussed the role of CRNA in patient's perioperative care. Patient understands.)        Anesthesia Quick Evaluation

## 2022-09-22 NOTE — Transfer of Care (Signed)
Immediate Anesthesia Transfer of Care Note  Patient: Audrey Hamilton  Procedure(s) Performed: COLONOSCOPY WITH PROPOFOL ESOPHAGOGASTRODUODENOSCOPY (EGD) WITH PROPOFOL  Patient Location: PACU  Anesthesia Type:MAC  Level of Consciousness: drowsy  Airway & Oxygen Therapy: Patient Spontanous Breathing and Patient connected to face mask oxygen  Post-op Assessment: Report given to RN and Post -op Vital signs reviewed and stable  Post vital signs: Reviewed  Last Vitals:  Vitals Value Taken Time  BP 115/64 09/22/22 1100  Temp 60F   Pulse 75 09/22/22 1100  Resp 24 09/22/22 1100  SpO2 99 % 09/22/22 1100  Vitals shown include unvalidated device data.  Last Pain:  Vitals:   09/22/22 0930  TempSrc: Temporal  PainSc: 0-No pain         Complications: No notable events documented.

## 2022-09-22 NOTE — H&P (Signed)
Outpatient short stay form Pre-procedure 09/22/2022  Regis Bill, MD  Primary Physician: Glori Luis, MD  Reason for visit:  Iron deficiency  History of present illness:    77 y/o lady with history of hypertension, hypothyroidism, and gastric surgery due to ulcers here for EGD/Colonoscopy for iron deficiency. No blood thinners. No family history of GI malignancies. No other surgeries besides the gastric surgery.    Current Facility-Administered Medications:    0.9 %  sodium chloride infusion, , Intravenous, Continuous, Sheena Simonis, Rossie Muskrat, MD, Last Rate: 20 mL/hr at 09/22/22 0942, New Bag at 09/22/22 0942  Medications Prior to Admission  Medication Sig Dispense Refill Last Dose   amLODipine (NORVASC) 10 MG tablet TAKE 1 TABLET BY MOUTH EVERY DAY 90 tablet 1 09/22/2022 at 0700   atorvastatin (LIPITOR) 80 MG tablet TAKE 1 TABLET (80 MG TOTAL) BY MOUTH DAILY FOR 180 DOSES. 90 tablet 3 09/21/2022   carvedilol (COREG) 3.125 MG tablet Take 1 tablet (3.125 mg total) by mouth 2 (two) times daily with a meal. 60 tablet 3 09/22/2022 at 0700   clonazePAM (KLONOPIN) 1 MG tablet TAKE 1 TABLET BY MOUTH EVERYDAY AT BEDTIME 30 tablet 0 09/21/2022   cyclobenzaprine (FLEXERIL) 5 MG tablet Take 5 mg by mouth at bedtime as needed.   09/21/2022   escitalopram (LEXAPRO) 10 MG tablet Take 1 tablet by mouth daily.   09/21/2022   hydrochlorothiazide (HYDRODIURIL) 25 MG tablet Take 1 tablet (25 mg total) by mouth daily. 90 tablet 3 09/22/2022 at 0700   hydroxychloroquine (PLAQUENIL) 200 MG tablet Take 200 mg by mouth 2 (two) times daily.   09/21/2022   loratadine (CLARITIN) 10 MG tablet TAKE 1 TABLET BY MOUTH EVERY DAY 90 tablet 3 09/21/2022   montelukast (SINGULAIR) 10 MG tablet TAKE 1 TABLET BY MOUTH EVERYDAY AT BEDTIME 90 tablet 0 09/21/2022   pantoprazole (PROTONIX) 40 MG tablet TAKE 1 TABLET (40 MG TOTAL) BY MOUTH 2 (TWO) TIMES DAILY BEFORE A MEAL FOR 14 DAYS, THEN 1 TABLET (40 MG TOTAL) DAILY BEFORE BREAKFAST. 180  tablet 1 09/21/2022   telmisartan (MICARDIS) 80 MG tablet Take 1 tablet (80 mg total) by mouth daily. 90 tablet 1 09/22/2022 at 0700   fluticasone (FLONASE) 50 MCG/ACT nasal spray Place into the nose at bedtime. Reported on 10/01/2015      omega-3 acid ethyl esters (LOVAZA) 1 g capsule TAKE 2 CAPSULES BY MOUTH TWICE A DAY 360 capsule 0 09/19/2022   polyethylene glycol-electrolytes (NULYTELY) 420 g solution Take 4,000 mLs by mouth once. (Patient not taking: Reported on 09/22/2022)   Completed Course   potassium chloride SA (K-DUR,KLOR-CON) 20 MEQ tablet Take 1 tablet (20 mEq total) by mouth daily. (Patient not taking: Reported on 09/22/2022) 6 tablet 0 Not Taking   traMADol (ULTRAM) 50 MG tablet TAKE 1 TABLET BY MOUTH EVERY 8 HOURS AS NEEDED 21 tablet 2      Allergies  Allergen Reactions   Amoxicillin-Pot Clavulanate    Aspirin     Aspirin cream only    Baclofen     Made pt feel dizzy    Fluconazole    Oxaprozin      Past Medical History:  Diagnosis Date   Allergy    Anxiety    controlled   Arthritis    Chicken pox    Colon polyps    Depression    Fibromyalgia    History of hypothyroidism    Hyperlipidemia    Hypokalemia    Multiple gastric  ulcers    Rheumatoid arthritis (HCC) Since 1995   currently in a flare up    Review of systems:  Otherwise negative.    Physical Exam  Gen: Alert, oriented. Appears stated age.  HEENT: PERRLA. Lungs: No respiratory distress CV: RRR Abd: soft, benign, no masses Ext: No edema    Planned procedures: Proceed with EGD/colonoscopy. The patient understands the nature of the planned procedure, indications, risks, alternatives and potential complications including but not limited to bleeding, infection, perforation, damage to internal organs and possible oversedation/side effects from anesthesia. The patient agrees and gives consent to proceed.  Please refer to procedure notes for findings, recommendations and patient disposition/instructions.      Regis Bill, MD [facility]

## 2022-09-22 NOTE — Op Note (Signed)
[facility] Patient Name: Audrey Hamilton Procedure Date: 09/22/2022 10:28 AM MRN: [id number] Account #: [id number] Date of Birth: [birth year] Admit Type: Outpatient Age: 77 Room: [facility] ENDO ROOM 1 Gender: Female Note Status: Finalized Instrument Name: Patton Salles Endoscope [id number] Procedure:             Upper GI endoscopy Indications:           Iron deficiency anemia Providers:             Eather Colas MD, MD Medicines:             Monitored Anesthesia Care Complications:         No immediate complications. Procedure:             Pre-Anesthesia Assessment:                        - Prior to the procedure, a History and Physical was                         performed, and patient medications and allergies were                         reviewed. The patient is competent. The risks and                         benefits of the procedure and the sedation options and                         risks were discussed with the patient. All questions                         were answered and informed consent was obtained.                         Patient identification and proposed procedure were                         verified by the physician, the nurse, the                         anesthesiologist, the anesthetist and the technician                         in the endoscopy suite. Mental Status Examination:                         alert and oriented. Airway Examination: normal                         oropharyngeal airway and neck mobility. Respiratory                         Examination: clear to auscultation. CV Examination:                         normal. Prophylactic Antibiotics: The patient does not                         require prophylactic antibiotics. Prior  Anticoagulants: The patient has taken no anticoagulant                         or antiplatelet agents. ASA Grade Assessment: III - A                         patient with severe  systemic disease. After reviewing                         the risks and benefits, the patient was deemed in                         satisfactory condition to undergo the procedure. The                         anesthesia plan was to use monitored anesthesia care                         (MAC). Immediately prior to administration of                         medications, the patient was re-assessed for adequacy                         to receive sedatives. The heart rate, respiratory                         rate, oxygen saturations, blood pressure, adequacy of                         pulmonary ventilation, and response to care were                         monitored throughout the procedure. The physical                         status of the patient was re-assessed after the                         procedure.                        After obtaining informed consent, the endoscope was                         passed under direct vision. Throughout the procedure,                         the patient's blood pressure, pulse, and oxygen                         saturations were monitored continuously. The Endoscope                         was introduced through the mouth, and advanced to the                         second part of duodenum. The upper GI endoscopy was  accomplished without difficulty. The patient tolerated                         the procedure well. Findings:      The examined esophagus was normal.      Patchy mild inflammation characterized by erythema was found in the       entire examined stomach.      Evidence of a patent Billroth II gastrojejunostomy was found. The       gastrojejunal anastomosis was characterized by healthy appearing mucosa.      The examined jejunum was normal. Impression:            - Normal esophagus.                        - Bile gastritis.                        - Patent Billroth II gastrojejunostomy was found,                          characterized by healthy appearing mucosa.                        - Normal examined jejunum.                        - No specimens collected. Recommendation:        - Discharge patient to home.                        - Resume previous diet.                        - Continue present medications.                        - Return to referring physician as previously                         scheduled. Procedure Code(s):     --- Professional ---                        [id number], Esophagogastroduodenoscopy, flexible,                         transoral; diagnostic, including collection of                         specimen(s) by brushing or washing, when performed                         (separate procedure) Diagnosis Code(s):     --- Professional ---                        K29.60, Other gastritis without bleeding                        Z98.0, Intestinal bypass and anastomosis status                        D50.9, Iron deficiency anemia, unspecified CPT  copyright 2022 [employer] Medical Association. All rights reserved. The codes documented in this report are preliminary and upon coder review may  be revised to meet current compliance requirements. Eather Colas MD, MD 09/22/2022 11:02:21 AM Number of Addenda: 0 Note Initiated On: 09/22/2022 10:28 AM      [facility]

## 2022-09-22 NOTE — Interval H&P Note (Signed)
History and Physical Interval Note:  09/22/2022 10:22 AM  Audrey Hamilton  has presented today for surgery, with the diagnosis of IDA GERD Dysphagia.  The various methods of treatment have been discussed with the patient and family. After consideration of risks, benefits and other options for treatment, the patient has consented to  Procedure(s): COLONOSCOPY WITH PROPOFOL (N/A) ESOPHAGOGASTRODUODENOSCOPY (EGD) WITH PROPOFOL (N/A) as a surgical intervention.  The patient's history has been reviewed, patient examined, no change in status, stable for surgery.  I have reviewed the patient's chart and labs.  Questions were answered to the patient's satisfaction.     Regis Bill  Ok to proceed with EGD/Colonoscopy

## 2022-09-22 NOTE — Anesthesia Postprocedure Evaluation (Signed)
Anesthesia Post Note  Patient: Audrey Hamilton  Procedure(s) Performed: COLONOSCOPY WITH PROPOFOL ESOPHAGOGASTRODUODENOSCOPY (EGD) WITH PROPOFOL  Patient location during evaluation: Endoscopy Anesthesia Type: General Level of consciousness: awake and alert Pain management: pain level controlled Vital Signs Assessment: post-procedure vital signs reviewed and stable Respiratory status: spontaneous breathing, nonlabored ventilation, respiratory function stable and patient connected to nasal cannula oxygen Cardiovascular status: blood pressure returned to baseline and stable Postop Assessment: no apparent nausea or vomiting Anesthetic complications: no   No notable events documented.   Last Vitals:  Vitals:   09/22/22 1110 09/22/22 1120  BP: (!) 113/52 (!) 125/58  Pulse: 75 74  Resp: (!) 26 15  Temp:    SpO2: 100% 98%    Last Pain:  Vitals:   09/22/22 1059  TempSrc: Tympanic  PainSc:                  Corinda Gubler

## 2022-09-23 ENCOUNTER — Encounter: Payer: Self-pay | Admitting: Gastroenterology

## 2022-09-24 ENCOUNTER — Other Ambulatory Visit: Payer: Self-pay | Admitting: Family Medicine

## 2022-09-25 NOTE — Telephone Encounter (Signed)
LOV: 08/21/22  NOV: 11/25/22

## 2022-09-28 DIAGNOSIS — M0609 Rheumatoid arthritis without rheumatoid factor, multiple sites: Secondary | ICD-10-CM | POA: Diagnosis not present

## 2022-09-28 DIAGNOSIS — M170 Bilateral primary osteoarthritis of knee: Secondary | ICD-10-CM | POA: Diagnosis not present

## 2022-09-28 DIAGNOSIS — Z79899 Other long term (current) drug therapy: Secondary | ICD-10-CM | POA: Diagnosis not present

## 2022-09-28 DIAGNOSIS — M797 Fibromyalgia: Secondary | ICD-10-CM | POA: Diagnosis not present

## 2022-10-01 ENCOUNTER — Other Ambulatory Visit: Payer: Self-pay | Admitting: Family Medicine

## 2022-10-01 DIAGNOSIS — E782 Mixed hyperlipidemia: Secondary | ICD-10-CM

## 2022-10-08 DIAGNOSIS — M62830 Muscle spasm of back: Secondary | ICD-10-CM | POA: Diagnosis not present

## 2022-10-08 DIAGNOSIS — M9903 Segmental and somatic dysfunction of lumbar region: Secondary | ICD-10-CM | POA: Diagnosis not present

## 2022-10-08 DIAGNOSIS — M5136 Other intervertebral disc degeneration, lumbar region: Secondary | ICD-10-CM | POA: Diagnosis not present

## 2022-10-08 DIAGNOSIS — M9905 Segmental and somatic dysfunction of pelvic region: Secondary | ICD-10-CM | POA: Diagnosis not present

## 2022-10-14 DIAGNOSIS — M0560 Rheumatoid arthritis of unspecified site with involvement of other organs and systems: Secondary | ICD-10-CM | POA: Diagnosis not present

## 2022-10-14 DIAGNOSIS — Z79899 Other long term (current) drug therapy: Secondary | ICD-10-CM | POA: Diagnosis not present

## 2022-10-15 ENCOUNTER — Other Ambulatory Visit: Payer: Self-pay | Admitting: Family Medicine

## 2022-10-15 NOTE — Telephone Encounter (Signed)
Refilled: 09/14/2022 Last OV: 08/21/2022 Next OV: 11/25/2022

## 2022-10-23 MED FILL — Iron Sucrose Inj 20 MG/ML (Fe Equiv): INTRAVENOUS | Qty: 10 | Status: AC

## 2022-10-26 ENCOUNTER — Inpatient Hospital Stay: Payer: Medicare Other | Attending: Internal Medicine

## 2022-10-26 ENCOUNTER — Inpatient Hospital Stay (HOSPITAL_BASED_OUTPATIENT_CLINIC_OR_DEPARTMENT_OTHER): Payer: Medicare Other | Admitting: Internal Medicine

## 2022-10-26 ENCOUNTER — Inpatient Hospital Stay: Payer: Medicare Other

## 2022-10-26 VITALS — BP 186/81 | HR 81 | Temp 97.4°F | Wt 182.2 lb

## 2022-10-26 DIAGNOSIS — K219 Gastro-esophageal reflux disease without esophagitis: Secondary | ICD-10-CM | POA: Insufficient documentation

## 2022-10-26 DIAGNOSIS — Z8711 Personal history of peptic ulcer disease: Secondary | ICD-10-CM | POA: Diagnosis not present

## 2022-10-26 DIAGNOSIS — Z8349 Family history of other endocrine, nutritional and metabolic diseases: Secondary | ICD-10-CM | POA: Diagnosis not present

## 2022-10-26 DIAGNOSIS — Z79899 Other long term (current) drug therapy: Secondary | ICD-10-CM | POA: Insufficient documentation

## 2022-10-26 DIAGNOSIS — Z8261 Family history of arthritis: Secondary | ICD-10-CM | POA: Insufficient documentation

## 2022-10-26 DIAGNOSIS — Z883 Allergy status to other anti-infective agents status: Secondary | ICD-10-CM | POA: Insufficient documentation

## 2022-10-26 DIAGNOSIS — Z886 Allergy status to analgesic agent status: Secondary | ICD-10-CM | POA: Diagnosis not present

## 2022-10-26 DIAGNOSIS — D509 Iron deficiency anemia, unspecified: Secondary | ICD-10-CM | POA: Insufficient documentation

## 2022-10-26 DIAGNOSIS — K648 Other hemorrhoids: Secondary | ICD-10-CM | POA: Diagnosis not present

## 2022-10-26 DIAGNOSIS — M25561 Pain in right knee: Secondary | ICD-10-CM | POA: Diagnosis not present

## 2022-10-26 DIAGNOSIS — Z9889 Other specified postprocedural states: Secondary | ICD-10-CM

## 2022-10-26 DIAGNOSIS — F32A Depression, unspecified: Secondary | ICD-10-CM | POA: Insufficient documentation

## 2022-10-26 DIAGNOSIS — Z833 Family history of diabetes mellitus: Secondary | ICD-10-CM | POA: Diagnosis not present

## 2022-10-26 DIAGNOSIS — E039 Hypothyroidism, unspecified: Secondary | ICD-10-CM | POA: Insufficient documentation

## 2022-10-26 DIAGNOSIS — M797 Fibromyalgia: Secondary | ICD-10-CM | POA: Insufficient documentation

## 2022-10-26 DIAGNOSIS — Z823 Family history of stroke: Secondary | ICD-10-CM | POA: Insufficient documentation

## 2022-10-26 DIAGNOSIS — K30 Functional dyspepsia: Secondary | ICD-10-CM | POA: Diagnosis not present

## 2022-10-26 DIAGNOSIS — Z880 Allergy status to penicillin: Secondary | ICD-10-CM | POA: Insufficient documentation

## 2022-10-26 DIAGNOSIS — E785 Hyperlipidemia, unspecified: Secondary | ICD-10-CM | POA: Insufficient documentation

## 2022-10-26 DIAGNOSIS — D72829 Elevated white blood cell count, unspecified: Secondary | ICD-10-CM | POA: Insufficient documentation

## 2022-10-26 DIAGNOSIS — M25562 Pain in left knee: Secondary | ICD-10-CM | POA: Insufficient documentation

## 2022-10-26 DIAGNOSIS — Z90710 Acquired absence of both cervix and uterus: Secondary | ICD-10-CM | POA: Diagnosis not present

## 2022-10-26 DIAGNOSIS — Z809 Family history of malignant neoplasm, unspecified: Secondary | ICD-10-CM | POA: Insufficient documentation

## 2022-10-26 DIAGNOSIS — Z8379 Family history of other diseases of the digestive system: Secondary | ICD-10-CM | POA: Insufficient documentation

## 2022-10-26 DIAGNOSIS — Z87891 Personal history of nicotine dependence: Secondary | ICD-10-CM | POA: Diagnosis not present

## 2022-10-26 DIAGNOSIS — Z8719 Personal history of other diseases of the digestive system: Secondary | ICD-10-CM | POA: Diagnosis not present

## 2022-10-26 DIAGNOSIS — M069 Rheumatoid arthritis, unspecified: Secondary | ICD-10-CM | POA: Diagnosis not present

## 2022-10-26 LAB — IRON AND TIBC
Iron: 110 ug/dL (ref 28–170)
Saturation Ratios: 25 % (ref 10.4–31.8)
TIBC: 434 ug/dL (ref 250–450)
UIBC: 324 ug/dL

## 2022-10-26 LAB — CBC WITH DIFFERENTIAL/PLATELET
Abs Immature Granulocytes: 0.1 10*3/uL — ABNORMAL HIGH (ref 0.00–0.07)
Basophils Absolute: 0.1 10*3/uL (ref 0.0–0.1)
Basophils Relative: 1 %
Eosinophils Absolute: 0.1 10*3/uL (ref 0.0–0.5)
Eosinophils Relative: 0 %
HCT: 45.6 % (ref 36.0–46.0)
Hemoglobin: 15.7 g/dL — ABNORMAL HIGH (ref 12.0–15.0)
Immature Granulocytes: 1 %
Lymphocytes Relative: 14 %
Lymphs Abs: 2.3 10*3/uL (ref 0.7–4.0)
MCH: 30.6 pg (ref 26.0–34.0)
MCHC: 34.4 g/dL (ref 30.0–36.0)
MCV: 88.9 fL (ref 80.0–100.0)
Monocytes Absolute: 0.5 10*3/uL (ref 0.1–1.0)
Monocytes Relative: 3 %
Neutro Abs: 13.1 10*3/uL — ABNORMAL HIGH (ref 1.7–7.7)
Neutrophils Relative %: 81 %
Platelets: 294 10*3/uL (ref 150–400)
RBC: 5.13 MIL/uL — ABNORMAL HIGH (ref 3.87–5.11)
RDW: 15 % (ref 11.5–15.5)
WBC: 16.1 10*3/uL — ABNORMAL HIGH (ref 4.0–10.5)
nRBC: 0 % (ref 0.0–0.2)

## 2022-10-26 LAB — VITAMIN B12: Vitamin B-12: 487 pg/mL (ref 180–914)

## 2022-10-26 LAB — FERRITIN: Ferritin: 60 ng/mL (ref 11–307)

## 2022-10-26 NOTE — Progress Notes (Signed)
[facility]  [phone] [phone]  ID: LONNA MAS OB: March 28, 1946  MR#: [id number]  AOZ#:[id number]  Patient Care Team: Glori Luis, MD as PCP - General (Family Medicine)  REFERRING PROVIDER: Dr. Birdie Sons  REASON FOR REFERRAL: Iron deficiency anemia  HPI: Audrey Hamilton is a 77 y.o. female with past medical history of rheumatoid arthritis on Plaquenil, GERD, gastric ulcer status post gastric surgery, hyperlipidemia, hypothyroidism, fibromyalgia, depression was referred to hematology for management of iron deficiency anemia.  Patient reports longstanding history of iron deficiency anemia.  She is on slow release iron but still has difficulty tolerating it.  She experiences GI upset.  Has history of gastric ulcers and had gastric surgery in 2000's.  Reports episodes of melanotic stools about 3 times a week. Colonoscopy done in March 2022 showed internal hemorrhoids, diverticula, multiple polyps with biopsies consistent with tubular adenoma.    Endoscopy with Dr. Mia Creek in June 2024 showed mild gastritis.  Colonoscopy showed 3 mm polyp at the ileocecal valve which was benign and internal hemorrhoids  Completed IV Venofer 200 mg weekly x 5 doses in March 2024  Interval history Patient was seen today as follow-up for iron deficiency anemia and labs. She reports feeling well.  With iron infusion, noticed improvement in her energy level.  Denies any bleeding in urine or stools. She is having issues with bilateral knee pain.  Has tried physical therapy and cortisone shot with no improvement.  Currently on tramadol with minimal improvement.  Has appointment with orthopedics on July 16. Continues to follow-up with Dr. Allena Katz rheumatology.  Stable on Plaquenil.  REVIEW OF SYSTEMS:   ROS  As per HPI. Otherwise, a complete review of systems is negative.  PAST MEDICAL HISTORY: Past Medical History:  Diagnosis Date   Allergy     Anxiety    controlled   Arthritis    Chicken pox    Colon polyps    Depression    Fibromyalgia    History of hypothyroidism    Hyperlipidemia    Hypokalemia    Multiple gastric ulcers    Rheumatoid arthritis (HCC) Since 1995   currently in a flare up    PAST SURGICAL HISTORY: Past Surgical History:  Procedure Laterality Date   ABDOMINAL HYSTERECTOMY  1974   AUGMENTATION MAMMAPLASTY Bilateral 1970's   silicone   COLONOSCOPY WITH PROPOFOL N/A 07/04/2018   Procedure: COLONOSCOPY WITH PROPOFOL;  Surgeon: Scot Jun, MD;  Location: [facility] ENDOSCOPY;  Service: Endoscopy;  Laterality: N/A;   COLONOSCOPY WITH PROPOFOL N/A 09/22/2022   Procedure: COLONOSCOPY WITH PROPOFOL;  Surgeon: Regis Bill, MD;  Location: [facility] ENDOSCOPY;  Service: Endoscopy;  Laterality: N/A;   ESOPHAGOGASTRODUODENOSCOPY (EGD) WITH PROPOFOL N/A 09/22/2022   Procedure: ESOPHAGOGASTRODUODENOSCOPY (EGD) WITH PROPOFOL;  Surgeon: Regis Bill, MD;  Location: [facility] ENDOSCOPY;  Service: Endoscopy;  Laterality: N/A;   EYE SURGERY     FOOT SURGERY Left 11/22/10   MASTECTOMY Bilateral 1970's   SUBCUTANEOUS MASTECTOMY - NO CANCER   SMALL INTESTINE SURGERY  12/2001   STOMACH SURGERY  12/2001   TONSILLECTOMY AND ADENOIDECTOMY  1966   UPPER GASTROINTESTINAL ENDOSCOPY      FAMILY HISTORY: Family History  Problem Relation Age of Onset   Arthritis Mother    Sudden death Mother        after birth of pt who was 84 days old.   Depression Mother    Arthritis Maternal Grandmother    Cancer Maternal Grandmother  Hyperlipidemia Maternal Grandfather    Diabetes Mellitus II Father    Liver disease Father    Rheum arthritis Son    Stroke Son    Breast cancer Neg Hx     HEALTH MAINTENANCE: Social History   Tobacco Use   Smoking status: Former    Types: Cigarettes    Quit date: 06/19/1995    Years since quitting: 27.3   Smokeless tobacco: Never  Vaping Use   Vaping Use: Never used  Substance Use Topics    Alcohol use: Yes    Alcohol/week: 0.0 - 1.0 standard drinks of alcohol    Comment: occ   Drug use: No     Allergies  Allergen Reactions   Amoxicillin-Pot Clavulanate    Aspirin     Aspirin cream only    Baclofen     Made pt feel dizzy    Fluconazole    Oxaprozin     Current Outpatient Medications  Medication Sig Dispense Refill   atorvastatin (LIPITOR) 80 MG tablet TAKE 1 TABLET (80 MG TOTAL) BY MOUTH DAILY FOR 180 DOSES. 90 tablet 3   carvedilol (COREG) 3.125 MG tablet Take 1 tablet (3.125 mg total) by mouth 2 (two) times daily with a meal. 60 tablet 3   clonazePAM (KLONOPIN) 1 MG tablet TAKE 1 TABLET BY MOUTH EVERYDAY AT BEDTIME 30 tablet 0   cyclobenzaprine (FLEXERIL) 5 MG tablet Take 5 mg by mouth at bedtime as needed.     escitalopram (LEXAPRO) 10 MG tablet Take 1 tablet by mouth daily.     fluticasone (FLONASE) 50 MCG/ACT nasal spray Place into the nose at bedtime. Reported on 10/01/2015     hydrochlorothiazide (HYDRODIURIL) 25 MG tablet Take 1 tablet (25 mg total) by mouth daily. 90 tablet 3   hydroxychloroquine (PLAQUENIL) 200 MG tablet Take 200 mg by mouth 2 (two) times daily.     loratadine (CLARITIN) 10 MG tablet TAKE 1 TABLET BY MOUTH EVERY DAY 90 tablet 3   montelukast (SINGULAIR) 10 MG tablet TAKE 1 TABLET BY MOUTH EVERYDAY AT BEDTIME 90 tablet 0   omega-3 acid ethyl esters (LOVAZA) 1 g capsule TAKE 2 CAPSULES BY MOUTH TWICE A DAY 360 capsule 0   pantoprazole (PROTONIX) 40 MG tablet TAKE 1 TABLET (40 MG TOTAL) BY MOUTH 2 (TWO) TIMES DAILY BEFORE A MEAL FOR 14 DAYS, THEN 1 TABLET (40 MG TOTAL) DAILY BEFORE BREAKFAST. 180 tablet 1   polyethylene glycol-electrolytes (NULYTELY) 420 g solution Take 4,000 mLs by mouth once.     potassium chloride SA (K-DUR,KLOR-CON) 20 MEQ tablet Take 1 tablet (20 mEq total) by mouth daily. 6 tablet 0   telmisartan (MICARDIS) 80 MG tablet Take 1 tablet (80 mg total) by mouth daily. 90 tablet 1   traMADol (ULTRAM) 50 MG tablet TAKE 1 TABLET  BY MOUTH EVERY 8 HOURS AS NEEDED 21 tablet 2   No current facility-administered medications for this visit.    OBJECTIVE: Vitals:   10/26/22 1331 10/26/22 1337  BP: (!) 178/82 (!) 186/81  Pulse: 81   Temp: (!) 97.4 F (36.3 C)   SpO2: 98%      Body mass index is 31.27 kg/m.      General: Well-developed, well-nourished, no acute distress. Eyes: Pink conjunctiva, anicteric sclera. HEENT: Normocephalic, moist mucous membranes, clear oropharnyx. Lungs: Clear to auscultation bilaterally. Heart: Regular rate and rhythm. No rubs, murmurs, or gallops. Abdomen: Soft, nontender, nondistended. No organomegaly noted, normoactive bowel sounds. Musculoskeletal: No edema, cyanosis, or clubbing. Neuro:  Alert, answering all questions appropriately. Cranial nerves grossly intact. Skin: No rashes or petechiae noted. Psych: Normal affect. Lymphatics: No cervical, calvicular, axillary or inguinal LAD.   LAB RESULTS:  Lab Results  Component Value Date   NA 139 08/18/2022   K 3.6 08/18/2022   CL 103 08/18/2022   CO2 26 08/18/2022   GLUCOSE 109 (H) 08/18/2022   BUN 16 08/18/2022   CREATININE 0.66 08/18/2022   CALCIUM 9.6 08/18/2022   PROT 6.3 08/18/2022   ALBUMIN 4.1 08/18/2022   AST 23 08/18/2022   ALT 28 08/18/2022   ALKPHOS 61 08/18/2022   BILITOT 0.2 08/18/2022   GFRNONAA 101 05/12/2019   GFRAA 117 05/12/2019    Lab Results  Component Value Date   WBC 16.1 (H) 10/26/2022   NEUTROABS 13.1 (H) 10/26/2022   HGB 15.7 (H) 10/26/2022   HCT 45.6 10/26/2022   MCV 88.9 10/26/2022   PLT 294 10/26/2022    Lab Results  Component Value Date   TIBC 445.2 02/17/2022   TIBC 404.6 01/14/2022   TIBC 407.4 04/08/2021   FERRITIN 14.7 02/17/2022   FERRITIN 21.4 01/14/2022   FERRITIN 23.5 04/08/2021   IRONPCTSAT 13.3 (L) 02/17/2022   IRONPCTSAT 11.9 (L) 01/14/2022   IRONPCTSAT 19.1 (L) 04/08/2021     STUDIES: No results found.  ASSESSMENT AND PLAN:   Audrey Hamilton is a 77  y.o. female with pmh of rheumatoid arthritis on Plaquenil, GERD, gastric ulcer status post gastric surgery, hyperlipidemia, hypothyroidism, fibromyalgia, depression was referred to hematology for management of iron deficiency anemia.  # Iron deficiency anemia -Not responding to oral iron.  Also difficulty tolerating oral iron due to GI upset.  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   -Endoscopy with Dr. Mia Creek in June 2024 showed mild gastritis.  Colonoscopy showed 3 mm polyp at the ileocecal valve which was benign and internal hemorrhoids -Completed IV Venofer 200 mg weekly x 5 doses in March 2024 -Hemoglobin 15.7.  Iron level is pending.  If low, I will reach out to the patient.  #H/o gastric surgery -B12 level was normal from October 2023.  -B12 level pending today  # Leukocytosis -Likely reactive from prednisone  # Rheumatoid arthritis # Fibromyalgia -Follows with Dr. Allena Katz.  On Plaquenil.  Currently on prednisone.  Orders Placed This Encounter  Procedures   Iron and TIBC   Ferritin   CBC with Differential/Platelet    RTC in 5 months for MD visit, labs  Patient expressed understanding and was in agreement with this plan. She also understands that She can call clinic at any time with any questions, concerns, or complaints.   I spent a total of 25 minutes reviewing chart data, face-to-face evaluation with the patient, counseling and coordination of care as detailed above.  Michaelyn Barter, MD   10/26/2022 2:48 PM

## 2022-10-26 NOTE — Progress Notes (Signed)
Patient is having severe knee pain, she rates her pain at an 8, and she is currently on Tramadol. She says that the Tramadol isn't helping her knee pain at all.

## 2022-10-26 NOTE — Progress Notes (Signed)
No Venofer today per MD 

## 2022-11-03 DIAGNOSIS — M170 Bilateral primary osteoarthritis of knee: Secondary | ICD-10-CM | POA: Diagnosis not present

## 2022-11-05 DIAGNOSIS — M5136 Other intervertebral disc degeneration, lumbar region: Secondary | ICD-10-CM | POA: Diagnosis not present

## 2022-11-05 DIAGNOSIS — M9905 Segmental and somatic dysfunction of pelvic region: Secondary | ICD-10-CM | POA: Diagnosis not present

## 2022-11-05 DIAGNOSIS — M62830 Muscle spasm of back: Secondary | ICD-10-CM | POA: Diagnosis not present

## 2022-11-05 DIAGNOSIS — M9903 Segmental and somatic dysfunction of lumbar region: Secondary | ICD-10-CM | POA: Diagnosis not present

## 2022-11-13 ENCOUNTER — Other Ambulatory Visit: Payer: Self-pay | Admitting: Family Medicine

## 2022-11-13 NOTE — Telephone Encounter (Signed)
LOV: 08/21/2022   NOV: 11/25/2022

## 2022-11-16 ENCOUNTER — Other Ambulatory Visit: Payer: Self-pay | Admitting: Family Medicine

## 2022-11-16 ENCOUNTER — Ambulatory Visit (INDEPENDENT_AMBULATORY_CARE_PROVIDER_SITE_OTHER): Payer: Medicare Other | Admitting: *Deleted

## 2022-11-16 VITALS — Ht 64.0 in | Wt 176.0 lb

## 2022-11-16 DIAGNOSIS — Z0000 Encounter for general adult medical examination without abnormal findings: Secondary | ICD-10-CM

## 2022-11-16 DIAGNOSIS — Z780 Asymptomatic menopausal state: Secondary | ICD-10-CM

## 2022-11-16 DIAGNOSIS — I10 Essential (primary) hypertension: Secondary | ICD-10-CM

## 2022-11-16 NOTE — Patient Instructions (Addendum)
Managing Pain Without Opioids Opioids are strong medicines used to treat moderate to severe pain. For some people, especially those who have long-term (chronic) pain, opioids may not be the best choice for pain management due to: Side effects like nausea, constipation, and sleepiness. The risk of addiction (opioid use disorder). The longer you take opioids, the greater your risk of addiction. Pain that lasts for more than 3 months is called chronic pain. Managing chronic pain usually requires more than one approach and is often provided by a team of health care providers working together (multidisciplinary approach). Pain management may be done at a pain management center or pain clinic. How to manage pain without the use of opioids Use non-opioid medicines Non-opioid medicines for pain may include: Over-the-counter or prescription non-steroidal anti-inflammatory drugs (NSAIDs). These may be the first medicines used for pain. They work well for muscle and bone pain, and they reduce swelling. Acetaminophen. This over-the-counter medicine may work well for milder pain but not swelling. Antidepressants. These may be used to treat chronic pain. A certain type of antidepressant (tricyclics) is often used. These medicines are given in lower doses for pain than when used for depression. Anticonvulsants. These are usually used to treat seizures but may also reduce nerve (neuropathic) pain. Muscle relaxants. These relieve pain caused by sudden muscle tightening (spasms). You may also use a pain medicine that is applied to the skin as a patch, cream, or gel (topical analgesic), such as a numbing medicine. These may cause fewer side effects than medicines taken by mouth. Do certain therapies as directed Some therapies can help with pain management. They include: Physical therapy. You will do exercises to gain strength and flexibility. A physical therapist may teach you exercises to move and stretch parts of  your body that are weak, stiff, or painful. You can learn these exercises at physical therapy visits and practice them at home. Physical therapy may also involve: Massage. Heat wraps or applying heat or cold to affected areas. Electrical signals that interrupt pain signals (transcutaneous electrical nerve stimulation, TENS). Weak lasers that reduce pain and swelling (low-level laser therapy). Signals from your body that help you learn to regulate pain (biofeedback). Occupational therapy. This helps you to learn ways to function at home and work with less pain. Recreational therapy. This involves trying new activities or hobbies, such as a physical activity or drawing. Mental health therapy, including: Cognitive behavioral therapy (CBT). This helps you learn coping skills for dealing with pain. Acceptance and commitment therapy (ACT) to change the way you think and react to pain. Relaxation therapies, including muscle relaxation exercises and mindfulness-based stress reduction. Pain management counseling. This may be individual, family, or group counseling.  Receive medical treatments Medical treatments for pain management include: Nerve block injections. These may include a pain blocker and anti-inflammatory medicines. You may have injections: Near the spine to relieve chronic back or neck pain. Into joints to relieve back or joint pain. Into nerve areas that supply a painful area to relieve body pain. Into muscles (trigger point injections) to relieve some painful muscle conditions. A medical device placed near your spine to help block pain signals and relieve nerve pain or chronic back pain (spinal cord stimulation device). Acupuncture. Follow these instructions at home Medicines Take over-the-counter and prescription medicines only as told by your health care provider. If you are taking pain medicine, ask your health care providers about possible side effects to watch out for. Do not  drive or use heavy machinery  while taking prescription opioid pain medicine. Lifestyle  Do not use drugs or alcohol to reduce pain. If you drink alcohol, limit how much you have to: 0-1 drink a day for women who are not pregnant. 0-2 drinks a day for men. Know how much alcohol is in a drink. In the [place] one drink equals one 12 oz bottle of beer (355 mL), one 5 oz glass of wine (148 mL), or one 1 oz glass of hard liquor (44 mL). Do not use any products that contain nicotine or tobacco. These products include cigarettes, chewing tobacco, and vaping devices, such as e-cigarettes. If you need help quitting, ask your health care provider. Eat a healthy diet and maintain a healthy weight. Poor diet and excess weight may make pain worse. Eat foods that are high in fiber. These include fresh fruits and vegetables, whole grains, and beans. Limit foods that are high in fat and processed sugars, such as fried and sweet foods. Exercise regularly. Exercise lowers stress and may help relieve pain. Ask your health care provider what activities and exercises are safe for you. If your health care provider approves, join an exercise class that combines movement and stress reduction. Examples include yoga and tai chi. Get enough sleep. Lack of sleep may make pain worse. Lower stress as much as possible. Practice stress reduction techniques as told by your therapist. General instructions Work with all your pain management providers to find the treatments that work best for you. You are an important member of your pain management team. There are many things you can do to reduce pain on your own. Consider joining an online or in-person support group for people who have chronic pain. Keep all follow-up visits. This is important. Where to find more information You can find more information about managing pain without opioids from: American Academy of Pain Medicine: painmed.org Institute for Chronic Pain:  instituteforchronicpain.org American Chronic Pain Association: theacpa.org Contact a health care provider if: You have side effects from pain medicine. Your pain gets worse or does not get better with treatments or home therapy. You are struggling with anxiety or depression. Summary Many types of pain can be managed without opioids. Chronic pain may respond better to pain management without opioids. Pain is best managed when you and a team of health care providers work together. Pain management without opioids may include non-opioid medicines, medical treatments, physical therapy, mental health therapy, and lifestyle changes. Tell your health care providers if your pain gets worse or is not being managed well enough. This information is not intended to replace advice given to you by your health care provider. Make sure you discuss any questions you have with your health care provider. Document Revised: 07/17/2020 Document Reviewed: 07/17/2020 Elsevier Patient Education  2024 [employer] Audrey Hamilton , Thank you for taking time to come for your Medicare Wellness Visit. I appreciate your ongoing commitment to your health goals. Please review the following plan we discussed and let me know if I can assist you in the future.   Referrals/Orders/Follow-Ups/Clinician Recommendations: Dexa Scan  This is a list of the screening recommended for you and due dates:  Health Maintenance  Topic Date Due   COVID-19 Vaccine (6 - 2023-24 season) 03/03/2022   Mammogram  09/26/2022   Zoster (Shingles) Vaccine (2 of 2) 09/30/2022   Flu Shot  11/19/2022   Medicare Annual Wellness Visit  11/16/2023   Colon Cancer Screening  09/21/2025   DTaP/Tdap/Td vaccine (2 - Td or Tdap)  11/01/2028   Pneumonia Vaccine  Completed   DEXA scan (bone density measurement)  Completed   Hepatitis C Screening  Completed   HPV Vaccine  Aged Out    Advanced directives: (Copy Requested) Please bring a copy of your health  care power of attorney and living will to the office to be added to your chart at your convenience.  Next Medicare Annual Wellness Visit scheduled for next year: Yes 11/19/23 @10 :15  Preventive Care 65 Years and Older, Female Preventive care refers to lifestyle choices and visits with your health care provider that can promote health and wellness. What does preventive care include? A yearly physical exam. This is also called an annual well check. Dental exams once or twice a year. Routine eye exams. Ask your health care provider how often you should have your eyes checked. Personal lifestyle choices, including: Daily care of your teeth and gums. Regular physical activity. Eating a healthy diet. Avoiding tobacco and drug use. Limiting alcohol use. Practicing safe sex. Taking low-dose aspirin every day. Taking vitamin and mineral supplements as recommended by your health care provider. What happens during an annual well check? The services and screenings done by your health care provider during your annual well check will depend on your age, overall health, lifestyle risk factors, and family history of disease. Counseling  Your health care provider may ask you questions about your: Alcohol use. Tobacco use. Drug use. Emotional well-being. Home and relationship well-being. Sexual activity. Eating habits. History of falls. Memory and ability to understand (cognition). Work and work Astronomer. Reproductive health. Screening  You may have the following tests or measurements: Height, weight, and BMI. Blood pressure. Lipid and cholesterol levels. These may be checked every 5 years, or more frequently if you are over 36 years old. Skin check. Lung cancer screening. You may have this screening every year starting at age 44 if you have a 30-pack-year history of smoking and currently smoke or have quit within the past 15 years. Fecal occult blood test (FOBT) of the stool. You may have  this test every year starting at age 36. Flexible sigmoidoscopy or colonoscopy. You may have a sigmoidoscopy every 5 years or a colonoscopy every 10 years starting at age 30. Hepatitis C blood test. Hepatitis B blood test. Sexually transmitted disease (STD) testing. Diabetes screening. This is done by checking your blood sugar (glucose) after you have not eaten for a while (fasting). You may have this done every 1-3 years. Bone density scan. This is done to screen for osteoporosis. You may have this done starting at age 77. Mammogram. This may be done every 1-2 years. Talk to your health care provider about how often you should have regular mammograms. Talk with your health care provider about your test results, treatment options, and if necessary, the need for more tests. Vaccines  Your health care provider may recommend certain vaccines, such as: Influenza vaccine. This is recommended every year. Tetanus, diphtheria, and acellular pertussis (Tdap, Td) vaccine. You may need a Td booster every 10 years. Zoster vaccine. You may need this after age 56. Pneumococcal 13-valent conjugate (PCV13) vaccine. One dose is recommended after age 74. Pneumococcal polysaccharide (PPSV23) vaccine. One dose is recommended after age 46. Talk to your health care provider about which screenings and vaccines you need and how often you need them. This information is not intended to replace advice given to you by your health care provider. Make sure you discuss any questions you have with your health  care provider. Document Released: 05/03/2015 Document Revised: 12/25/2015 Document Reviewed: 02/05/2015 Elsevier Interactive Patient Education  2017 [employer].  Fall Prevention in the Home Falls can cause injuries. They can happen to people of all ages. There are many things you can do to make your home safe and to help prevent falls. What can I do on the outside of my home? Regularly fix the edges of walkways and  driveways and fix any cracks. Remove anything that might make you trip as you walk through a door, such as a raised step or threshold. Trim any bushes or trees on the path to your home. Use bright outdoor lighting. Clear any walking paths of anything that might make someone trip, such as rocks or tools. Regularly check to see if handrails are loose or broken. Make sure that both sides of any steps have handrails. Any raised decks and porches should have guardrails on the edges. Have any leaves, snow, or ice cleared regularly. Use sand or salt on walking paths during winter. Clean up any spills in your garage right away. This includes oil or grease spills. What can I do in the bathroom? Use night lights. Install grab bars by the toilet and in the tub and shower. Do not use towel bars as grab bars. Use non-skid mats or decals in the tub or shower. If you need to sit down in the shower, use a plastic, non-slip stool. Keep the floor dry. Clean up any water that spills on the floor as soon as it happens. Remove soap buildup in the tub or shower regularly. Attach bath mats securely with double-sided non-slip rug tape. Do not have throw rugs and other things on the floor that can make you trip. What can I do in the bedroom? Use night lights. Make sure that you have a light by your bed that is easy to reach. Do not use any sheets or blankets that are too big for your bed. They should not hang down onto the floor. Have a firm chair that has side arms. You can use this for support while you get dressed. Do not have throw rugs and other things on the floor that can make you trip. What can I do in the kitchen? Clean up any spills right away. Avoid walking on wet floors. Keep items that you use a lot in easy-to-reach places. If you need to reach something above you, use a strong step stool that has a grab bar. Keep electrical cords out of the way. Do not use floor polish or wax that makes floors  slippery. If you must use wax, use non-skid floor wax. Do not have throw rugs and other things on the floor that can make you trip. What can I do with my stairs? Do not leave any items on the stairs. Make sure that there are handrails on both sides of the stairs and use them. Fix handrails that are broken or loose. Make sure that handrails are as long as the stairways. Check any carpeting to make sure that it is firmly attached to the stairs. Fix any carpet that is loose or worn. Avoid having throw rugs at the top or bottom of the stairs. If you do have throw rugs, attach them to the floor with carpet tape. Make sure that you have a light switch at the top of the stairs and the bottom of the stairs. If you do not have them, ask someone to add them for you. What else can I  do to help prevent falls? Wear shoes that: Do not have high heels. Have rubber bottoms. Are comfortable and fit you well. Are closed at the toe. Do not wear sandals. If you use a stepladder: Make sure that it is fully opened. Do not climb a closed stepladder. Make sure that both sides of the stepladder are locked into place. Ask someone to hold it for you, if possible. Clearly mark and make sure that you can see: Any grab bars or handrails. First and last steps. Where the edge of each step is. Use tools that help you move around (mobility aids) if they are needed. These include: Canes. Walkers. Scooters. Crutches. Turn on the lights when you go into a dark area. Replace any light bulbs as soon as they burn out. Set up your furniture so you have a clear path. Avoid moving your furniture around. If any of your floors are uneven, fix them. If there are any pets around you, be aware of where they are. Review your medicines with your doctor. Some medicines can make you feel dizzy. This can increase your chance of falling. Ask your doctor what other things that you can do to help prevent falls. This information is not  intended to replace advice given to you by your health care provider. Make sure you discuss any questions you have with your health care provider. Document Released: 01/31/2009 Document Revised: 09/12/2015 Document Reviewed: 05/11/2014 Elsevier Interactive Patient Education  2017 [employer].

## 2022-11-16 NOTE — Progress Notes (Signed)
Subjective:   Audrey Hamilton is a 77 y.o. female who presents for Medicare Annual (Subsequent) preventive examination.  Visit Complete: Virtual  I connected with  Audrey Hamilton on 11/16/22 by a audio enabled telemedicine application and verified that I am speaking with the correct person using two identifiers.  Patient Location: Home  Provider Location: Office/Clinic  I discussed the limitations of evaluation and management by telemedicine. The patient expressed understanding and agreed to proceed.  Vital Signs: Unable to obtain new vitals due to this being a telehealth visit.   Review of Systems     Cardiac Risk Factors include: advanced age (>31men, >14 women);obesity (BMI >30kg/m2);hypertension;dyslipidemia     Objective:    Today's Vitals   11/16/22 1327 11/16/22 1328  Weight: 176 lb (79.8 kg)   Height: 5\' 4"  (1.626 m)   PainSc:  8    Body mass index is 30.21 kg/m.     10/26/2022    1:18 PM 09/22/2022    9:26 AM 07/03/2022    3:30 PM 06/25/2022   11:04 AM 11/02/2019   10:44 AM 10/31/2018    3:34 PM 07/04/2018    9:02 AM  Advanced Directives  Does Patient Have a Medical Advance Directive? No Yes Yes Yes Yes Yes Yes  Type of Advance Directive  Living will;Healthcare Power of Attorney Living will;Healthcare Power of [employer] Power of [place];Living will Healthcare Power of [place];Living will Healthcare Power of [employer] of [place];Living will  Does patient want to make changes to medical advance directive?     No - Patient declined No - Patient declined   Copy of Healthcare Power of Attorney in Chart?  No - copy requested No - copy requested  No - copy requested No - copy requested Yes - validated most recent copy scanned in chart (See row information)  Would patient like information on creating a medical advance directive?   No - Patient declined        Current Medications (verified) Outpatient Encounter Medications as of  11/16/2022  Medication Sig   atorvastatin (LIPITOR) 80 MG tablet TAKE 1 TABLET (80 MG TOTAL) BY MOUTH DAILY FOR 180 DOSES.   carvedilol (COREG) 3.125 MG tablet TAKE 1 TABLET BY MOUTH TWICE A DAY WITH A MEAL   clonazePAM (KLONOPIN) 1 MG tablet TAKE 1 TABLET BY MOUTH EVERYDAY AT BEDTIME   cyclobenzaprine (FLEXERIL) 5 MG tablet Take 5 mg by mouth at bedtime as needed.   escitalopram (LEXAPRO) 10 MG tablet Take 1 tablet by mouth daily.   fluticasone (FLONASE) 50 MCG/ACT nasal spray Place into the nose at bedtime. Reported on 10/01/2015 As needed   hydrochlorothiazide (HYDRODIURIL) 25 MG tablet Take 1 tablet (25 mg total) by mouth daily.   hydroxychloroquine (PLAQUENIL) 200 MG tablet Take 200 mg by mouth 2 (two) times daily.   loratadine (CLARITIN) 10 MG tablet TAKE 1 TABLET BY MOUTH EVERY DAY   montelukast (SINGULAIR) 10 MG tablet TAKE 1 TABLET BY MOUTH EVERYDAY AT BEDTIME   omega-3 acid ethyl esters (LOVAZA) 1 g capsule TAKE 2 CAPSULES BY MOUTH TWICE A DAY   pantoprazole (PROTONIX) 40 MG tablet TAKE 1 TABLET (40 MG TOTAL) BY MOUTH 2 (TWO) TIMES DAILY BEFORE A MEAL FOR 14 DAYS, THEN 1 TABLET (40 MG TOTAL) DAILY BEFORE BREAKFAST.   telmisartan (MICARDIS) 80 MG tablet Take 1 tablet (80 mg total) by mouth daily.   traMADol (ULTRAM) 50 MG tablet TAKE 1 TABLET BY MOUTH EVERY 8 HOURS AS  NEEDED   polyethylene glycol-electrolytes (NULYTELY) 420 g solution Take 4,000 mLs by mouth once. (Patient not taking: Reported on 11/16/2022)   potassium chloride SA (K-DUR,KLOR-CON) 20 MEQ tablet Take 1 tablet (20 mEq total) by mouth daily. (Patient not taking: Reported on 11/16/2022)   No facility-administered encounter medications on file as of 11/16/2022.    Allergies (verified) Amoxicillin-pot clavulanate, Aspirin, Baclofen, Fluconazole, and Oxaprozin   History: Past Medical History:  Diagnosis Date   Allergy    Anxiety    controlled   Arthritis    Chicken pox    Colon polyps    Depression    Fibromyalgia     History of hypothyroidism    Hyperlipidemia    Hypokalemia    Multiple gastric ulcers    Rheumatoid arthritis (HCC) Since 1995   currently in a flare up   Past Surgical History:  Procedure Laterality Date   ABDOMINAL HYSTERECTOMY  1974   AUGMENTATION MAMMAPLASTY Bilateral 1970's   silicone   COLONOSCOPY WITH PROPOFOL N/A 07/04/2018   Procedure: COLONOSCOPY WITH PROPOFOL;  Surgeon: Scot Jun, MD;  Location: [facility] ENDOSCOPY;  Service: Endoscopy;  Laterality: N/A;   COLONOSCOPY WITH PROPOFOL N/A 09/22/2022   Procedure: COLONOSCOPY WITH PROPOFOL;  Surgeon: Regis Bill, MD;  Location: [facility] ENDOSCOPY;  Service: Endoscopy;  Laterality: N/A;   ESOPHAGOGASTRODUODENOSCOPY (EGD) WITH PROPOFOL N/A 09/22/2022   Procedure: ESOPHAGOGASTRODUODENOSCOPY (EGD) WITH PROPOFOL;  Surgeon: Regis Bill, MD;  Location: [facility] ENDOSCOPY;  Service: Endoscopy;  Laterality: N/A;   EYE SURGERY     FOOT SURGERY Left 11/22/10   MASTECTOMY Bilateral 1970's   SUBCUTANEOUS MASTECTOMY - NO CANCER   SMALL INTESTINE SURGERY  12/2001   STOMACH SURGERY  12/2001   TONSILLECTOMY AND ADENOIDECTOMY  1966   UPPER GASTROINTESTINAL ENDOSCOPY     Family History  Problem Relation Age of Onset   Arthritis Mother    Sudden death Mother        after birth of pt who was 67 days old.   Depression Mother    Arthritis Maternal Grandmother    Cancer Maternal Grandmother    Hyperlipidemia Maternal Grandfather    Diabetes Mellitus II Father    Liver disease Father    Rheum arthritis Son    Stroke Son    Breast cancer Neg Hx    Social History   Socioeconomic History   Marital status: Widowed    Spouse name: Not on file   Number of children: Not on file   Years of education: Not on file   Highest education level: 9th grade  Occupational History   Not on file  Tobacco Use   Smoking status: Former    Current packs/day: 0.00    Types: Cigarettes    Quit date: 06/19/1995    Years since quitting: 27.4    Smokeless tobacco: Never  Vaping Use   Vaping status: Never Used  Substance and Sexual Activity   Alcohol use: Yes    Alcohol/week: 0.0 - 1.0 standard drinks of alcohol    Comment: occ   Drug use: No   Sexual activity: Not on file  Other Topics Concern   Not on file  Social History Narrative   Not on file   Social Determinants of Health   Financial Resource Strain: Low Risk  (11/16/2022)   Overall Financial Resource Strain (CARDIA)    Difficulty of Paying Living Expenses: Not hard at all  Food Insecurity: No Food Insecurity (11/16/2022)   Hunger Vital Sign  Worried About Programme researcher, broadcasting/film/video in the Last Year: Never true    Ran Out of Food in the Last Year: Never true  Transportation Needs: No Transportation Needs (11/16/2022)   PRAPARE - Administrator, Civil Service (Medical): No    Lack of Transportation (Non-Medical): No  Physical Activity: Unknown (11/16/2022)   Exercise Vital Sign    Days of Exercise per Week: Patient declined    Minutes of Exercise per Session: 0 min  Stress: No Stress Concern Present (11/16/2022)   [employer] of Occupational Health - Occupational Stress Questionnaire    Feeling of Stress : Not at all  Social Connections: Moderately Isolated (11/16/2022)   Social Connection and Isolation Panel [NHANES]    Frequency of Communication with Friends and Family: More than three times a week    Frequency of Social Gatherings with Friends and Family: More than three times a week    Attends Religious Services: More than 4 times per year    Active Member of [employer] or Organizations: No    Attends Banker Meetings: Never    Marital Status: Widowed    Tobacco Counseling Counseling given: Not Answered   Clinical Intake:  Pre-visit preparation completed: Yes  Pain : 0-10 Pain Score: 8  Pain Type: Chronic pain Pain Location: Knee Pain Orientation: Right Pain Descriptors / Indicators: Aching Pain Onset: More than a month  ago Pain Frequency: Constant Pain Relieving Factors: medication  Pain Relieving Factors: medication  BMI - recorded: 30.21 Nutritional Status: BMI > 30  Obese Nutritional Risks: None Diabetes: No  How often do you need to have someone help you when you read instructions, pamphlets, or other written materials from your doctor or pharmacy?: 1 - Never  Interpreter Needed?: No  Information entered by :: R. Sargun Rummell LPN   Activities of Daily Living    11/16/2022    1:32 PM 11/13/2022    2:07 PM  In your present state of health, do you have any difficulty performing the following activities:  Hearing? 0 0  Vision? 0 0  Comment glasses   Difficulty concentrating or making decisions? 0 0  Walking or climbing stairs? 1 1  Comment because of knees   Dressing or bathing? 0 0  Doing errands, shopping? 0 1  Preparing Food and eating ? N N  Using the Toilet? N N  In the past six months, have you accidently leaked urine? Y Y  Comment weak bladder, wears pads   Do you have problems with loss of bowel control? N N  Managing your Medications? N N  Managing your Finances? N N  Housekeeping or managing your Housekeeping? N N    Patient Care Team: Glori Luis, MD as PCP - General (Family Medicine)  Indicate any recent Medical Services you may have received from other than [facility] providers in the past year (date may be approximate).     Assessment:   This is a routine wellness examination for Audrey Hamilton.  Hearing/Vision screen Hearing Screening - Comments:: No issues Vision Screening - Comments:: glasses  Dietary issues and exercise activities discussed:     Goals Addressed   None    Depression Screen    11/16/2022    1:41 PM 08/21/2022   11:30 AM 06/25/2022   11:11 AM 05/20/2022   10:13 AM 01/14/2022    2:32 PM 11/05/2021    1:08 PM 10/10/2021   11:33 AM  PHQ 2/9 Scores  PHQ - 2 Score  0 0 0 0 0 0 0  PHQ- 9 Score 3 6         Fall Risk    11/16/2022    1:35 PM 11/13/2022     2:07 PM 08/21/2022   11:30 AM 05/20/2022   10:12 AM 01/14/2022    2:32 PM  Fall Risk   Falls in the past year? 1 1 1  0 0  Number falls in past yr: 0 0 0 0 0  Injury with Fall? 0 0 0 0 0  Risk for fall due to : History of fall(s);Impaired mobility;Medication side effect  No Fall Risks No Fall Risks No Fall Risks  Risk for fall due to: Comment knee popped      Follow up Falls evaluation completed;Falls prevention discussed;Education provided  Falls evaluation completed Falls evaluation completed Falls evaluation completed    MEDICARE RISK AT HOME:  Medicare Risk at Home - 11/16/22 1336     Any stairs in or around the home? No    If so, are there any without handrails? No    Home free of loose throw rugs in walkways, pet beds, electrical cords, etc? Yes    Adequate lighting in your home to reduce risk of falls? Yes    Life alert? Yes    Use of a cane, walker or w/c? Yes   several types   Grab bars in the bathroom? No    Shower chair or bench in shower? Yes    Elevated toilet seat or a handicapped toilet? Yes              Cognitive Function:    10/27/2017    9:42 AM  MMSE - Mini Mental State Exam  Orientation to time 5  Orientation to Place 5  Registration 3  Attention/ Calculation 5  Recall 3  Language- name 2 objects 2  Language- repeat 1  Language- follow 3 step command 3  Language- read & follow direction 1  Write a sentence 1  Copy design 1  Total score 30        11/16/2022    2:14 PM 10/31/2018    3:39 PM  6CIT Screen  What Year? 0 points 0 points  What month? 0 points 0 points  What time? 0 points 0 points  Count back from 20 0 points 0 points  Months in reverse 0 points 0 points  Repeat phrase 0 points 0 points  Total Score 0 points 0 points    Immunizations Immunization History  Administered Date(s) Administered   Influenza, High Dose Seasonal PF 12/12/2016, 01/17/2018, 12/03/2018, 12/03/2018, 12/27/2019, 01/06/2022   Influenza-Unspecified  12/20/2014, 01/02/2016, 12/16/2020   PFIZER Comirnaty(Gray Top)Covid-19 Tri-Sucrose Vaccine 09/18/2020   PFIZER(Purple Top)SARS-COV-2 Vaccination 05/25/2019, 06/24/2019, 02/26/2020   PNEUMOCOCCAL CONJUGATE-20 05/20/2022   Pneumococcal Polysaccharide-23 12/03/2018   Respiratory Syncytial Virus Vaccine,Recomb Aduvanted(Arexvy) 03/26/2022   Tdap 11/02/2018   Unspecified SARS-COV-2 Vaccination 01/06/2022   Zoster Recombinant(Shingrix) 08/05/2022   Zoster, Live 05/09/1997    TDAP status: Up to date  Flu Vaccine status: Up to date  Pneumococcal vaccine status: Up to date  Covid-19 vaccine status: Completed vaccines  Qualifies for Shingles Vaccine? Yes   Zostavax completed Yes   Shingrix Completed?: Yes  Patient will schedule her second one  Screening Tests Health Maintenance  Topic Date Due   COVID-19 Vaccine (6 - 2023-24 season) 03/03/2022   MAMMOGRAM  09/26/2022   Zoster Vaccines- Shingrix (2 of 2) 09/30/2022   INFLUENZA VACCINE  11/19/2022  Medicare Annual Wellness (AWV)  11/16/2023   Colonoscopy  09/21/2025   DTaP/Tdap/Td (2 - Td or Tdap) 11/01/2028   Pneumonia Vaccine 68+ Years old  Completed   DEXA SCAN  Completed   Hepatitis C Screening  Completed   HPV VACCINES  Aged Out    Health Maintenance  Health Maintenance Due  Topic Date Due   COVID-19 Vaccine (6 - 2023-24 season) 03/03/2022   MAMMOGRAM  09/26/2022   Zoster Vaccines- Shingrix (2 of 2) 09/30/2022    Colorectal cancer screening: Type of screening: Colonoscopy. Completed 6/24. Repeat every 1-2  years because of solution  Mammogram status: Completed 6/23. Repeat every year Patient is going to call and schedule  Bone Density status: Completed 07/2015. Results reflect: Bone density results: NORMAL. Repeat every 2 years. Order placed  Lung Cancer Screening: (Low Dose CT Chest recommended if Age 9-80 years, 20 pack-year currently smoking OR have quit w/in 15years.) does not qualify.   Additional  Screening:  Hepatitis C Screening: does qualify; Completed 5/17  Vision Screening: Recommended annual ophthalmology exams for early detection of glaucoma and other disorders of the eye. Is the patient up to date with their annual eye exam?  Yes  Who is the provider or what is the name of the office in which the patient attends annual eye exams? [facility] If pt is not established with a provider, would they like to be referred to a provider to establish care? No .   Dental Screening: Recommended annual dental exams for proper oral hygiene    Community Resource Referral / Chronic Care Management: CRR required this visit?  No   CCM required this visit?  No     Plan:     I have personally reviewed and noted the following in the patient's chart:   Medical and social history Use of alcohol, tobacco or illicit drugs  Current medications and supplements including opioid prescriptions. Patient is currently taking opioid prescriptions. Information provided to patient regarding non-opioid alternatives. Patient advised to discuss non-opioid treatment plan with their provider. Functional ability and status Nutritional status Physical activity Advanced directives List of other physicians Hospitalizations, surgeries, and ER visits in previous 12 months Vitals Screenings to include cognitive, depression, and falls Referrals and appointments  In addition, I have reviewed and discussed with patient certain preventive protocols, quality metrics, and best practice recommendations. A written personalized care plan for preventive services as well as general preventive health recommendations were provided to patient.     Sydell Axon, LPN   1/61/0960   After Visit Summary: (MyChart) Due to this being a telephonic visit, the after visit summary with patients personalized plan was offered to patient via MyChart   Nurse Notes: None

## 2022-11-17 DIAGNOSIS — M170 Bilateral primary osteoarthritis of knee: Secondary | ICD-10-CM | POA: Diagnosis not present

## 2022-11-18 ENCOUNTER — Other Ambulatory Visit: Payer: Self-pay | Admitting: Family Medicine

## 2022-11-18 DIAGNOSIS — Z1231 Encounter for screening mammogram for malignant neoplasm of breast: Secondary | ICD-10-CM

## 2022-11-24 DIAGNOSIS — M170 Bilateral primary osteoarthritis of knee: Secondary | ICD-10-CM | POA: Diagnosis not present

## 2022-11-25 ENCOUNTER — Ambulatory Visit (INDEPENDENT_AMBULATORY_CARE_PROVIDER_SITE_OTHER): Payer: Medicare Other | Admitting: Family Medicine

## 2022-11-25 ENCOUNTER — Encounter: Payer: Self-pay | Admitting: Family Medicine

## 2022-11-25 VITALS — BP 138/80 | HR 70 | Temp 98.1°F | Ht 64.0 in | Wt 177.0 lb

## 2022-11-25 DIAGNOSIS — M069 Rheumatoid arthritis, unspecified: Secondary | ICD-10-CM

## 2022-11-25 DIAGNOSIS — M1711 Unilateral primary osteoarthritis, right knee: Secondary | ICD-10-CM | POA: Diagnosis not present

## 2022-11-25 DIAGNOSIS — I10 Essential (primary) hypertension: Secondary | ICD-10-CM

## 2022-11-25 DIAGNOSIS — R6883 Chills (without fever): Secondary | ICD-10-CM | POA: Insufficient documentation

## 2022-11-25 DIAGNOSIS — E785 Hyperlipidemia, unspecified: Secondary | ICD-10-CM

## 2022-11-25 DIAGNOSIS — G4700 Insomnia, unspecified: Secondary | ICD-10-CM | POA: Diagnosis not present

## 2022-11-25 MED ORDER — TRAMADOL HCL 50 MG PO TABS
50.0000 mg | ORAL_TABLET | Freq: Three times a day (TID) | ORAL | 0 refills | Status: DC | PRN
Start: 2022-11-25 — End: 2022-12-17

## 2022-11-25 MED ORDER — CARVEDILOL 6.25 MG PO TABS: 6.2500 mg | ORAL_TABLET | Freq: Two times a day (BID) | ORAL | 2 refills | Status: DC

## 2022-11-25 NOTE — Assessment & Plan Note (Signed)
Minimal occurrences lasting very briefly.  Undetermined cause.  Discussed that we could be multiple causes for this.  If she has more prolonged chills or she develops fever she will let us know.

## 2022-11-25 NOTE — Assessment & Plan Note (Signed)
Chronic issue.  Still above goal.  Will increase carvedilol to 6.25 mg twice daily.  She will continue telmisartan 80 mg daily and HCTZ 25 mg daily.  Follow-up in 1 month.

## 2022-11-25 NOTE — Assessment & Plan Note (Signed)
Chronic issue.  Patient can continue clonazepam 1 mg nightly as needed for sleep.

## 2022-11-25 NOTE — Patient Instructions (Signed)
Nice to see you. We are going to increase your carvedilol dose to 6.25 mg twice daily.  You can take two of the 3.125 mg tablets twice daily until you run out of your current supply.  If you notice any fatigue with this increased dose please let us know.

## 2022-11-25 NOTE — Assessment & Plan Note (Signed)
Chronic issue.  She will continue to see rheumatology.  She can continue tramadol 50 mg every 8 hours as needed for pain.

## 2022-11-25 NOTE — Assessment & Plan Note (Signed)
Chronic issue.  Continue Lipitor 80 mg daily and Lovaza 2 g twice daily.  Lipid panel up-to-date.

## 2022-11-25 NOTE — Progress Notes (Signed)
[name], MD Phone: [phone]  Audrey Hamilton is a 77 y.o. female who presents today for f/u.  HYPERTENSION Disease Monitoring Home BP Monitoring 140s-155/70s-85 Chest pain- no    Dyspnea- no Medications Compliance-  taking coreg, telmisartan, HCTZ.  Edema- notes only with her knee issues BMET    Component Value Date/Time   NA 139 08/18/2022 0944   NA 141 05/12/2019 1440   K 3.6 08/18/2022 0944   CL 103 08/18/2022 0944   CO2 26 08/18/2022 0944   GLUCOSE 109 (H) 08/18/2022 0944   BUN 16 08/18/2022 0944   BUN 13 05/12/2019 1440   CREATININE 0.66 08/18/2022 0944   CALCIUM 9.6 08/18/2022 0944   GFRNONAA 101 05/12/2019 1440   GFRAA 117 05/12/2019 1440   HYPERLIPIDEMIA Symptoms Chest pain on exertion:  no   Medications: Compliance- taking lipitor, lovaza Right upper quadrant pain- no  Muscle aches- no change to chronic issues Lipid Panel     Component Value Date/Time   CHOL 174 08/18/2022 0944   TRIG 181.0 (H) 08/18/2022 0944   HDL 65.00 08/18/2022 0944   CHOLHDL 3 08/18/2022 0944   VLDL 36.2 08/18/2022 0944   LDLCALC 73 08/18/2022 0944   LDLDIRECT 89.0 07/04/2021 0957   Insomnia: Continues on clonazepam.  Gets 6 hours nightly.  No drowsiness.  Rheumatoid arthritis: Continues to see rheumatology.  She continues tramadol for pain control.  No drowsiness with this medication.  Right knee arthritis: Patient notes she is going to have her knee replaced in a few months.  Her orthopedist put her on meloxicam which has given her minimal benefit for her pain.  Patient reports getting chills on 3 occasions that lasted for 3 to 4 seconds.  No fevers.  Social History   Tobacco Use  Smoking Status Former   Current packs/day: 0.00   Types: Cigarettes   Quit date: 06/19/1995   Years since quitting: 27.4  Smokeless Tobacco Never    Current Outpatient Medications on File Prior to Visit  Medication Sig Dispense Refill   atorvastatin (LIPITOR) 80 MG tablet TAKE 1  TABLET (80 MG TOTAL) BY MOUTH DAILY FOR 180 DOSES. 90 tablet 3   clonazePAM (KLONOPIN) 1 MG tablet TAKE 1 TABLET BY MOUTH EVERYDAY AT BEDTIME 30 tablet 0   escitalopram (LEXAPRO) 10 MG tablet Take 1 tablet by mouth daily.     fluticasone (FLONASE) 50 MCG/ACT nasal spray Place into the nose at bedtime. Reported on 10/01/2015 As needed     hydrochlorothiazide (HYDRODIURIL) 25 MG tablet Take 1 tablet (25 mg total) by mouth daily. 90 tablet 3   hydroxychloroquine (PLAQUENIL) 200 MG tablet Take 200 mg by mouth 2 (two) times daily.     loratadine (CLARITIN) 10 MG tablet TAKE 1 TABLET BY MOUTH EVERY DAY 90 tablet 3   meloxicam (MOBIC) 7.5 MG tablet Take 7.5 mg by mouth daily.     montelukast (SINGULAIR) 10 MG tablet TAKE 1 TABLET BY MOUTH EVERYDAY AT BEDTIME 90 tablet 0   omega-3 acid ethyl esters (LOVAZA) 1 g capsule TAKE 2 CAPSULES BY MOUTH TWICE A DAY 360 capsule 0   pantoprazole (PROTONIX) 40 MG tablet TAKE 1 TABLET (40 MG TOTAL) BY MOUTH 2 (TWO) TIMES DAILY BEFORE A MEAL FOR 14 DAYS, THEN 1 TABLET (40 MG TOTAL) DAILY BEFORE BREAKFAST. 180 tablet 1   telmisartan (MICARDIS) 80 MG tablet Take 1 tablet (80 mg total) by mouth daily. 90 tablet 1   cyclobenzaprine (FLEXERIL) 5 MG tablet Take 5 mg  by mouth at bedtime as needed. (Patient not taking: Reported on 11/25/2022)     polyethylene glycol-electrolytes (NULYTELY) 420 g solution Take 4,000 mLs by mouth once. (Patient not taking: Reported on 11/16/2022)     potassium chloride SA (K-DUR,KLOR-CON) 20 MEQ tablet Take 1 tablet (20 mEq total) by mouth daily. (Patient not taking: Reported on 11/16/2022) 6 tablet 0   No current facility-administered medications on file prior to visit.     ROS see history of present illness  Objective  Physical Exam Vitals:   11/25/22 1144  BP: 138/80  Pulse: 70  Temp: 98.1 F (36.7 C)  SpO2: 98%    BP Readings from Last 3 Encounters:  11/25/22 138/80  10/26/22 (!) 186/81  09/22/22 (!) 125/58   Wt Readings from  Last 3 Encounters:  11/25/22 177 lb (80.3 kg)  11/16/22 176 lb (79.8 kg)  10/26/22 182 lb 3.2 oz (82.6 kg)    Physical Exam Constitutional:      General: She is not in acute distress.    Appearance: She is not diaphoretic.  Cardiovascular:     Rate and Rhythm: Normal rate and regular rhythm.     Heart sounds: Normal heart sounds.  Pulmonary:     Effort: Pulmonary effort is normal.     Breath sounds: Normal breath sounds.  Skin:    General: Skin is warm and dry.  Neurological:     Mental Status: She is alert.      Assessment/Plan: Please see individual problem list.  Essential hypertension Assessment & Plan: Chronic issue.  Still above goal.  Will increase carvedilol to 6.25 mg twice daily.  She will continue telmisartan 80 mg daily and HCTZ 25 mg daily.  Follow-up in 1 month.  Orders: -     Carvedilol; Take 1 tablet (6.25 mg total) by mouth 2 (two) times daily with a meal.  Dispense: 60 tablet; Refill: 2  Rheumatoid arthritis involving multiple sites, unspecified whether rheumatoid factor present [facility]) Assessment & Plan: Chronic issue.  She will continue to see rheumatology.  She can continue tramadol 50 mg every 8 hours as needed for pain.  Orders: -     traMADol HCl; Take 1 tablet (50 mg total) by mouth every 8 (eight) hours as needed.  Dispense: 21 tablet; Refill: 0  Insomnia, unspecified type Assessment & Plan: Chronic issue.  Patient can continue clonazepam 1 mg nightly as needed for sleep.   Primary osteoarthritis of right knee Assessment & Plan: Chronic issue.  She will continue to see orthopedics.  Advise she could discontinue the meloxicam if it is not helpful.   Hyperlipidemia, unspecified hyperlipidemia type Assessment & Plan: Chronic issue.  Continue Lipitor 80 mg daily and Lovaza 2 g twice daily.  Lipid panel up-to-date.   Chills Assessment & Plan: Minimal occurrences lasting very briefly.  Undetermined cause.  Discussed that we could be multiple  causes for this.  If she has more prolonged chills or she develops fever she will let us know.      Return in about 1 month (around 12/26/2022) for Hypertension.   [name], MD [facility]

## 2022-11-25 NOTE — Assessment & Plan Note (Signed)
Chronic issue.  She will continue to see orthopedics.  Advise she could discontinue the meloxicam if it is not helpful.

## 2022-11-28 DIAGNOSIS — Z23 Encounter for immunization: Secondary | ICD-10-CM | POA: Diagnosis not present

## 2022-12-01 DIAGNOSIS — M170 Bilateral primary osteoarthritis of knee: Secondary | ICD-10-CM | POA: Diagnosis not present

## 2022-12-03 ENCOUNTER — Encounter (INDEPENDENT_AMBULATORY_CARE_PROVIDER_SITE_OTHER): Payer: Self-pay

## 2022-12-08 DIAGNOSIS — M25562 Pain in left knee: Secondary | ICD-10-CM | POA: Diagnosis not present

## 2022-12-08 DIAGNOSIS — M25561 Pain in right knee: Secondary | ICD-10-CM | POA: Diagnosis not present

## 2022-12-08 DIAGNOSIS — M170 Bilateral primary osteoarthritis of knee: Secondary | ICD-10-CM | POA: Diagnosis not present

## 2022-12-09 DIAGNOSIS — M25562 Pain in left knee: Secondary | ICD-10-CM | POA: Diagnosis not present

## 2022-12-09 DIAGNOSIS — M25561 Pain in right knee: Secondary | ICD-10-CM | POA: Diagnosis not present

## 2022-12-09 DIAGNOSIS — M170 Bilateral primary osteoarthritis of knee: Secondary | ICD-10-CM | POA: Diagnosis not present

## 2022-12-14 ENCOUNTER — Other Ambulatory Visit: Payer: Self-pay | Admitting: Family Medicine

## 2022-12-14 NOTE — Telephone Encounter (Signed)
Spoke with pt and she stated that she does not have enough medication to get her through till Dr. Birdie Sons returns on Wednesday.   Last OV: 11/25/2022 Next OV: 08/21/2022 Refilled: 11/13/2022

## 2022-12-15 DIAGNOSIS — M170 Bilateral primary osteoarthritis of knee: Secondary | ICD-10-CM | POA: Diagnosis not present

## 2022-12-15 DIAGNOSIS — M25562 Pain in left knee: Secondary | ICD-10-CM | POA: Diagnosis not present

## 2022-12-15 DIAGNOSIS — M25561 Pain in right knee: Secondary | ICD-10-CM | POA: Diagnosis not present

## 2022-12-17 ENCOUNTER — Other Ambulatory Visit: Payer: Self-pay | Admitting: Family Medicine

## 2022-12-17 DIAGNOSIS — M069 Rheumatoid arthritis, unspecified: Secondary | ICD-10-CM

## 2022-12-18 DIAGNOSIS — M25562 Pain in left knee: Secondary | ICD-10-CM | POA: Diagnosis not present

## 2022-12-18 DIAGNOSIS — M25561 Pain in right knee: Secondary | ICD-10-CM | POA: Diagnosis not present

## 2022-12-18 DIAGNOSIS — M170 Bilateral primary osteoarthritis of knee: Secondary | ICD-10-CM | POA: Diagnosis not present

## 2022-12-22 DIAGNOSIS — M25561 Pain in right knee: Secondary | ICD-10-CM | POA: Diagnosis not present

## 2022-12-22 DIAGNOSIS — M25562 Pain in left knee: Secondary | ICD-10-CM | POA: Diagnosis not present

## 2022-12-22 DIAGNOSIS — M170 Bilateral primary osteoarthritis of knee: Secondary | ICD-10-CM | POA: Diagnosis not present

## 2022-12-23 ENCOUNTER — Telehealth: Payer: Self-pay

## 2022-12-23 DIAGNOSIS — M170 Bilateral primary osteoarthritis of knee: Secondary | ICD-10-CM | POA: Diagnosis not present

## 2022-12-23 NOTE — Telephone Encounter (Signed)
We received a surgical clearance via fax from [facility].  Darien Ramus hand-delivered a copy to Prince Solian, CMA.  I save a copy to Dr. Bernardo Heater folder on the S drive.

## 2022-12-24 DIAGNOSIS — M25561 Pain in right knee: Secondary | ICD-10-CM | POA: Diagnosis not present

## 2022-12-24 DIAGNOSIS — M170 Bilateral primary osteoarthritis of knee: Secondary | ICD-10-CM | POA: Diagnosis not present

## 2022-12-24 DIAGNOSIS — M25562 Pain in left knee: Secondary | ICD-10-CM | POA: Diagnosis not present

## 2022-12-28 ENCOUNTER — Other Ambulatory Visit: Payer: Self-pay | Admitting: Family Medicine

## 2022-12-28 DIAGNOSIS — E782 Mixed hyperlipidemia: Secondary | ICD-10-CM

## 2022-12-29 DIAGNOSIS — M170 Bilateral primary osteoarthritis of knee: Secondary | ICD-10-CM | POA: Diagnosis not present

## 2022-12-29 DIAGNOSIS — M25562 Pain in left knee: Secondary | ICD-10-CM | POA: Diagnosis not present

## 2022-12-29 DIAGNOSIS — M25561 Pain in right knee: Secondary | ICD-10-CM | POA: Diagnosis not present

## 2022-12-30 ENCOUNTER — Encounter: Payer: Self-pay | Admitting: Family Medicine

## 2022-12-30 ENCOUNTER — Ambulatory Visit (INDEPENDENT_AMBULATORY_CARE_PROVIDER_SITE_OTHER): Payer: Medicare Other | Admitting: Family Medicine

## 2022-12-30 VITALS — BP 132/82 | HR 68 | Temp 97.9°F | Ht 64.0 in | Wt 176.8 lb

## 2022-12-30 DIAGNOSIS — M1711 Unilateral primary osteoarthritis, right knee: Secondary | ICD-10-CM

## 2022-12-30 DIAGNOSIS — I10 Essential (primary) hypertension: Secondary | ICD-10-CM | POA: Diagnosis not present

## 2022-12-30 DIAGNOSIS — Z01818 Encounter for other preprocedural examination: Secondary | ICD-10-CM | POA: Diagnosis not present

## 2022-12-30 HISTORY — DX: Encounter for other preprocedural examination: Z01.818

## 2022-12-30 NOTE — Assessment & Plan Note (Signed)
Patient is going to have a right total knee replacement.  Audrey Hamilton perioperative risk for MI or cardiac arrest is 0.2%.  This places her in a low risk category.  She is as optimized as she can be medically.  Form signed off and will be faxed back to orthopedics.

## 2022-12-30 NOTE — Assessment & Plan Note (Signed)
Chronic issue.  Has improved.  She will continue carvedilol 6.25 mg twice daily, telmisartan 80 mg daily, and HCTZ 25 mg daily.  She will monitor blood pressures and if they trend up from where they have been over the last week or so she will let us know.  Follow-up in 3 months.

## 2022-12-30 NOTE — Assessment & Plan Note (Signed)
Chronic issue.  Preoperative examination completed today.  Patient will proceed with knee replacement.

## 2022-12-30 NOTE — Progress Notes (Signed)
Audrey Alar, MD Phone: [phone]  Audrey Hamilton is a 77 y.o. female who presents today for f/u.  HYPERTENSION Disease Monitoring Home BP Monitoring progressively improving, now consistently less than 140/80 chest pain- no    Dyspnea- no Medications Compliance-  taking telmisartan, coreg, HCTZ.  Edema- no BMET    Component Value Date/Time   NA 139 08/18/2022 0944   NA 141 05/12/2019 1440   K 3.6 08/18/2022 0944   CL 103 08/18/2022 0944   CO2 26 08/18/2022 0944   GLUCOSE 109 (H) 08/18/2022 0944   BUN 16 08/18/2022 0944   BUN 13 05/12/2019 1440   CREATININE 0.66 08/18/2022 0944   CALCIUM 9.6 08/18/2022 0944   GFRNONAA 101 05/12/2019 1440   GFRAA 117 05/12/2019 1440   Right knee osteoarthritis: Patient is going to have a total knee replacement.  She is here for surgical clearance.  She notes no chest pain or shortness of breath.  Her arthritic issues limit her ability to walk longer distances and up stairs.  Social History   Tobacco Use  Smoking Status Former   Current packs/day: 0.00   Types: Cigarettes   Quit date: 06/19/1995   Years since quitting: 27.5  Smokeless Tobacco Never    Current Outpatient Medications on File Prior to Visit  Medication Sig Dispense Refill   atorvastatin (LIPITOR) 80 MG tablet TAKE 1 TABLET (80 MG TOTAL) BY MOUTH DAILY FOR 180 DOSES. 90 tablet 3   carvedilol (COREG) 6.25 MG tablet Take 1 tablet (6.25 mg total) by mouth 2 (two) times daily with a meal. 60 tablet 2   clonazePAM (KLONOPIN) 1 MG tablet TAKE 1 TABLET BY MOUTH EVERYDAY AT BEDTIME 30 tablet 0   cyclobenzaprine (FLEXERIL) 5 MG tablet Take 5 mg by mouth at bedtime as needed.     escitalopram (LEXAPRO) 10 MG tablet Take 1 tablet by mouth daily.     fluticasone (FLONASE) 50 MCG/ACT nasal spray Place into the nose at bedtime. Reported on 10/01/2015 As needed     hydrochlorothiazide (HYDRODIURIL) 25 MG tablet Take 1 tablet (25 mg total) by mouth daily. 90 tablet 3    hydroxychloroquine (PLAQUENIL) 200 MG tablet Take 200 mg by mouth 2 (two) times daily.     loratadine (CLARITIN) 10 MG tablet TAKE 1 TABLET BY MOUTH EVERY DAY 90 tablet 3   meloxicam (MOBIC) 7.5 MG tablet Take 7.5 mg by mouth daily.     montelukast (SINGULAIR) 10 MG tablet TAKE 1 TABLET BY MOUTH EVERYDAY AT BEDTIME 90 tablet 0   omega-3 acid ethyl esters (LOVAZA) 1 g capsule TAKE 2 CAPSULES BY MOUTH TWICE A DAY 360 capsule 0   polyethylene glycol-electrolytes (NULYTELY) 420 g solution Take 4,000 mLs by mouth once.     potassium chloride SA (K-DUR,KLOR-CON) 20 MEQ tablet Take 1 tablet (20 mEq total) by mouth daily. 6 tablet 0   telmisartan (MICARDIS) 80 MG tablet Take 1 tablet (80 mg total) by mouth daily. 90 tablet 1   traMADol (ULTRAM) 50 MG tablet TAKE 1 TABLET BY MOUTH EVERY 8 HOURS AS NEEDED. 21 tablet 0   pantoprazole (PROTONIX) 40 MG tablet TAKE 1 TABLET (40 MG TOTAL) BY MOUTH 2 (TWO) TIMES DAILY BEFORE A MEAL FOR 14 DAYS, THEN 1 TABLET (40 MG TOTAL) DAILY BEFORE BREAKFAST. 180 tablet 1   No current facility-administered medications on file prior to visit.     ROS see history of present illness  Objective  Physical Exam Vitals:   12/30/22 1127  BP: 132/82  Pulse: 68  Temp: 97.9 F (36.6 C)  SpO2: 96%    BP Readings from Last 3 Encounters:  12/30/22 132/82  11/25/22 138/80  10/26/22 (!) 186/81   Wt Readings from Last 3 Encounters:  12/30/22 176 lb 12.8 oz (80.2 kg)  11/25/22 177 lb (80.3 kg)  11/16/22 176 lb (79.8 kg)    Physical Exam Constitutional:      General: She is not in acute distress.    Appearance: She is not diaphoretic.  Cardiovascular:     Rate and Rhythm: Normal rate and regular rhythm.     Heart sounds: Normal heart sounds.  Pulmonary:     Effort: Pulmonary effort is normal.     Breath sounds: Normal breath sounds.  Musculoskeletal:     Right lower leg: No edema.     Left lower leg: No edema.  Skin:    General: Skin is warm and dry.   Neurological:     Mental Status: She is alert.      Assessment/Plan: Please see individual problem list.  Essential hypertension Assessment & Plan: Chronic issue.  Has improved.  She will continue carvedilol 6.25 mg twice daily, telmisartan 80 mg daily, and HCTZ 25 mg daily.  She will monitor blood pressures and if they trend up from where they have been over the last week or so she will let us know.  Follow-up in 3 months.   Preoperative examination Assessment & Plan: Patient is going to have a right total knee replacement.  Chales Abrahams perioperative risk for MI or cardiac arrest is 0.2%.  This places her in a low risk category.  She is as optimized as she can be medically.  Form signed off and will be faxed back to orthopedics.   Primary osteoarthritis of right knee Assessment & Plan: Chronic issue.  Preoperative examination completed today.  Patient will proceed with knee replacement.     Return in about 3 months (around 03/31/2023).   Audrey Alar, MD [facility]

## 2023-01-01 DIAGNOSIS — M25561 Pain in right knee: Secondary | ICD-10-CM | POA: Diagnosis not present

## 2023-01-01 DIAGNOSIS — M170 Bilateral primary osteoarthritis of knee: Secondary | ICD-10-CM | POA: Diagnosis not present

## 2023-01-01 DIAGNOSIS — M25562 Pain in left knee: Secondary | ICD-10-CM | POA: Diagnosis not present

## 2023-01-04 ENCOUNTER — Other Ambulatory Visit: Payer: Self-pay | Admitting: Family Medicine

## 2023-01-04 DIAGNOSIS — M170 Bilateral primary osteoarthritis of knee: Secondary | ICD-10-CM | POA: Diagnosis not present

## 2023-01-04 DIAGNOSIS — M069 Rheumatoid arthritis, unspecified: Secondary | ICD-10-CM

## 2023-01-04 DIAGNOSIS — M25561 Pain in right knee: Secondary | ICD-10-CM | POA: Diagnosis not present

## 2023-01-04 DIAGNOSIS — M25562 Pain in left knee: Secondary | ICD-10-CM | POA: Diagnosis not present

## 2023-01-05 NOTE — Telephone Encounter (Signed)
Requesting: Tramadol Contract: No UDS: No Last Visit: 12/30/2022 Next Visit: 03/31/2023 Last Refill: 12/17/2022  Please Advise

## 2023-01-07 DIAGNOSIS — M25561 Pain in right knee: Secondary | ICD-10-CM | POA: Diagnosis not present

## 2023-01-07 DIAGNOSIS — M25562 Pain in left knee: Secondary | ICD-10-CM | POA: Diagnosis not present

## 2023-01-07 DIAGNOSIS — M170 Bilateral primary osteoarthritis of knee: Secondary | ICD-10-CM | POA: Diagnosis not present

## 2023-01-13 ENCOUNTER — Other Ambulatory Visit: Payer: Self-pay | Admitting: Family Medicine

## 2023-01-15 DIAGNOSIS — M25562 Pain in left knee: Secondary | ICD-10-CM | POA: Diagnosis not present

## 2023-01-15 DIAGNOSIS — M170 Bilateral primary osteoarthritis of knee: Secondary | ICD-10-CM | POA: Diagnosis not present

## 2023-01-15 DIAGNOSIS — M25561 Pain in right knee: Secondary | ICD-10-CM | POA: Diagnosis not present

## 2023-01-19 ENCOUNTER — Other Ambulatory Visit: Payer: Self-pay | Admitting: Family Medicine

## 2023-01-19 DIAGNOSIS — M25561 Pain in right knee: Secondary | ICD-10-CM | POA: Diagnosis not present

## 2023-01-19 DIAGNOSIS — I10 Essential (primary) hypertension: Secondary | ICD-10-CM

## 2023-01-19 DIAGNOSIS — M170 Bilateral primary osteoarthritis of knee: Secondary | ICD-10-CM | POA: Diagnosis not present

## 2023-01-19 DIAGNOSIS — M25562 Pain in left knee: Secondary | ICD-10-CM | POA: Diagnosis not present

## 2023-01-21 ENCOUNTER — Other Ambulatory Visit: Payer: Self-pay | Admitting: Family Medicine

## 2023-01-21 DIAGNOSIS — M797 Fibromyalgia: Secondary | ICD-10-CM | POA: Diagnosis not present

## 2023-01-21 DIAGNOSIS — M069 Rheumatoid arthritis, unspecified: Secondary | ICD-10-CM

## 2023-01-21 DIAGNOSIS — Z79899 Other long term (current) drug therapy: Secondary | ICD-10-CM | POA: Diagnosis not present

## 2023-01-21 DIAGNOSIS — M170 Bilateral primary osteoarthritis of knee: Secondary | ICD-10-CM | POA: Diagnosis not present

## 2023-01-21 DIAGNOSIS — M7918 Myalgia, other site: Secondary | ICD-10-CM | POA: Diagnosis not present

## 2023-01-21 DIAGNOSIS — M0609 Rheumatoid arthritis without rheumatoid factor, multiple sites: Secondary | ICD-10-CM | POA: Diagnosis not present

## 2023-01-22 NOTE — Telephone Encounter (Signed)
Can you contact the patient and see if she has enough to get through to Monday on this medication? If she does need the refill now, can you send to the doc of the day as I am having trouble signing off on controlled substances from home?

## 2023-01-26 DIAGNOSIS — M25562 Pain in left knee: Secondary | ICD-10-CM | POA: Diagnosis not present

## 2023-01-26 DIAGNOSIS — M25561 Pain in right knee: Secondary | ICD-10-CM | POA: Diagnosis not present

## 2023-01-26 DIAGNOSIS — M170 Bilateral primary osteoarthritis of knee: Secondary | ICD-10-CM | POA: Diagnosis not present

## 2023-01-26 NOTE — Addendum Note (Signed)
Addended by: Sydell Axon C on: 01/26/2023 03:43 PM   Modules accepted: Orders

## 2023-01-29 DIAGNOSIS — M25562 Pain in left knee: Secondary | ICD-10-CM | POA: Diagnosis not present

## 2023-01-29 DIAGNOSIS — M25561 Pain in right knee: Secondary | ICD-10-CM | POA: Diagnosis not present

## 2023-01-29 DIAGNOSIS — M170 Bilateral primary osteoarthritis of knee: Secondary | ICD-10-CM | POA: Diagnosis not present

## 2023-02-01 ENCOUNTER — Ambulatory Visit
Admission: RE | Admit: 2023-02-01 | Discharge: 2023-02-01 | Disposition: A | Discharge status: Home / Self Care | Payer: Medicare Other | Source: Ambulatory Visit | Attending: Family Medicine | Admitting: Family Medicine

## 2023-02-01 ENCOUNTER — Other Ambulatory Visit: Payer: Medicare Other

## 2023-02-01 DIAGNOSIS — Z780 Asymptomatic menopausal state: Secondary | ICD-10-CM | POA: Diagnosis not present

## 2023-02-01 DIAGNOSIS — Z1231 Encounter for screening mammogram for malignant neoplasm of breast: Secondary | ICD-10-CM | POA: Insufficient documentation

## 2023-02-01 DIAGNOSIS — M810 Age-related osteoporosis without current pathological fracture: Secondary | ICD-10-CM | POA: Diagnosis not present

## 2023-02-02 DIAGNOSIS — M25562 Pain in left knee: Secondary | ICD-10-CM | POA: Diagnosis not present

## 2023-02-02 DIAGNOSIS — M170 Bilateral primary osteoarthritis of knee: Secondary | ICD-10-CM | POA: Diagnosis not present

## 2023-02-02 DIAGNOSIS — M25561 Pain in right knee: Secondary | ICD-10-CM | POA: Diagnosis not present

## 2023-02-05 DIAGNOSIS — M25562 Pain in left knee: Secondary | ICD-10-CM | POA: Diagnosis not present

## 2023-02-05 DIAGNOSIS — M25561 Pain in right knee: Secondary | ICD-10-CM | POA: Diagnosis not present

## 2023-02-05 DIAGNOSIS — M170 Bilateral primary osteoarthritis of knee: Secondary | ICD-10-CM | POA: Diagnosis not present

## 2023-02-09 ENCOUNTER — Other Ambulatory Visit: Payer: Self-pay | Admitting: Nurse Practitioner

## 2023-02-09 DIAGNOSIS — M069 Rheumatoid arthritis, unspecified: Secondary | ICD-10-CM

## 2023-02-10 DIAGNOSIS — M25562 Pain in left knee: Secondary | ICD-10-CM | POA: Diagnosis not present

## 2023-02-10 DIAGNOSIS — M170 Bilateral primary osteoarthritis of knee: Secondary | ICD-10-CM | POA: Diagnosis not present

## 2023-02-10 DIAGNOSIS — M25561 Pain in right knee: Secondary | ICD-10-CM | POA: Diagnosis not present

## 2023-02-11 ENCOUNTER — Other Ambulatory Visit: Payer: Self-pay | Admitting: Family Medicine

## 2023-02-16 DIAGNOSIS — M170 Bilateral primary osteoarthritis of knee: Secondary | ICD-10-CM | POA: Diagnosis not present

## 2023-02-16 DIAGNOSIS — M25562 Pain in left knee: Secondary | ICD-10-CM | POA: Diagnosis not present

## 2023-02-16 DIAGNOSIS — M25561 Pain in right knee: Secondary | ICD-10-CM | POA: Diagnosis not present

## 2023-02-17 DIAGNOSIS — M1711 Unilateral primary osteoarthritis, right knee: Secondary | ICD-10-CM | POA: Diagnosis not present

## 2023-02-19 ENCOUNTER — Other Ambulatory Visit: Payer: Self-pay | Admitting: Family Medicine

## 2023-02-19 DIAGNOSIS — I10 Essential (primary) hypertension: Secondary | ICD-10-CM

## 2023-02-24 ENCOUNTER — Other Ambulatory Visit: Payer: Self-pay | Admitting: Orthopedic Surgery

## 2023-02-24 DIAGNOSIS — M25561 Pain in right knee: Secondary | ICD-10-CM | POA: Diagnosis not present

## 2023-02-24 DIAGNOSIS — M170 Bilateral primary osteoarthritis of knee: Secondary | ICD-10-CM | POA: Diagnosis not present

## 2023-02-24 DIAGNOSIS — M25562 Pain in left knee: Secondary | ICD-10-CM | POA: Diagnosis not present

## 2023-02-27 ENCOUNTER — Other Ambulatory Visit: Payer: Self-pay | Admitting: Family Medicine

## 2023-02-27 DIAGNOSIS — K219 Gastro-esophageal reflux disease without esophagitis: Secondary | ICD-10-CM

## 2023-03-01 ENCOUNTER — Encounter
Admission: RE | Admit: 2023-03-01 | Discharge: 2023-03-01 | Disposition: A | Discharge status: Home / Self Care | Payer: Medicare Other | Source: Ambulatory Visit | Attending: Orthopedic Surgery | Admitting: Orthopedic Surgery

## 2023-03-01 ENCOUNTER — Other Ambulatory Visit: Payer: Medicare Other

## 2023-03-01 ENCOUNTER — Other Ambulatory Visit: Payer: Self-pay

## 2023-03-01 VITALS — BP 146/94 | HR 72 | Temp 97.3°F | Resp 18 | Ht 64.0 in | Wt 172.0 lb

## 2023-03-01 DIAGNOSIS — Z01810 Encounter for preprocedural cardiovascular examination: Secondary | ICD-10-CM | POA: Diagnosis not present

## 2023-03-01 DIAGNOSIS — Z01818 Encounter for other preprocedural examination: Secondary | ICD-10-CM | POA: Insufficient documentation

## 2023-03-01 HISTORY — DX: Insomnia, unspecified: G47.00

## 2023-03-01 HISTORY — DX: Radiculopathy, cervical region: M54.12

## 2023-03-01 HISTORY — DX: Essential (primary) hypertension: I10

## 2023-03-01 HISTORY — DX: Prediabetes: R73.03

## 2023-03-01 HISTORY — DX: Gastro-esophageal reflux disease without esophagitis: K21.9

## 2023-03-01 HISTORY — DX: Other specified postprocedural states: Z98.890

## 2023-03-01 HISTORY — DX: Iron deficiency anemia, unspecified: D50.9

## 2023-03-01 HISTORY — DX: Leakage of breast prosthesis and implant, initial encounter: T85.43XA

## 2023-03-01 LAB — CBC WITH DIFFERENTIAL/PLATELET
Abs Immature Granulocytes: 0.01 10*3/uL (ref 0.00–0.07)
Basophils Absolute: 0.1 10*3/uL (ref 0.0–0.1)
Basophils Relative: 1 %
Eosinophils Absolute: 0.3 10*3/uL (ref 0.0–0.5)
Eosinophils Relative: 4 %
HCT: 43.3 % (ref 36.0–46.0)
Hemoglobin: 14.8 g/dL (ref 12.0–15.0)
Immature Granulocytes: 0 %
Lymphocytes Relative: 33 %
Lymphs Abs: 2.3 10*3/uL (ref 0.7–4.0)
MCH: 31.8 pg (ref 26.0–34.0)
MCHC: 34.2 g/dL (ref 30.0–36.0)
MCV: 92.9 fL (ref 80.0–100.0)
Monocytes Absolute: 0.6 10*3/uL (ref 0.1–1.0)
Monocytes Relative: 9 %
Neutro Abs: 3.7 10*3/uL (ref 1.7–7.7)
Neutrophils Relative %: 53 %
Platelets: 244 10*3/uL (ref 150–400)
RBC: 4.66 MIL/uL (ref 3.87–5.11)
RDW: 13.2 % (ref 11.5–15.5)
WBC: 7 10*3/uL (ref 4.0–10.5)
nRBC: 0 % (ref 0.0–0.2)

## 2023-03-01 LAB — URINALYSIS, ROUTINE W REFLEX MICROSCOPIC
Bilirubin Urine: NEGATIVE
Glucose, UA: NEGATIVE mg/dL
Hgb urine dipstick: NEGATIVE
Ketones, ur: NEGATIVE mg/dL
Nitrite: NEGATIVE
Protein, ur: NEGATIVE mg/dL
Specific Gravity, Urine: 1.016 (ref 1.005–1.030)
pH: 5 (ref 5.0–8.0)

## 2023-03-01 LAB — COMPREHENSIVE METABOLIC PANEL
ALT: 18 U/L (ref 0–44)
AST: 18 U/L (ref 15–41)
Albumin: 4.1 g/dL (ref 3.5–5.0)
Alkaline Phosphatase: 46 U/L (ref 38–126)
Anion gap: 9 (ref 5–15)
BUN: 18 mg/dL (ref 8–23)
CO2: 24 mmol/L (ref 22–32)
Calcium: 9.5 mg/dL (ref 8.9–10.3)
Chloride: 104 mmol/L (ref 98–111)
Creatinine, Ser: 0.66 mg/dL (ref 0.44–1.00)
GFR, Estimated: >60 mL/min (ref >60)
Glucose, Bld: 113 mg/dL — ABNORMAL HIGH (ref 70–99)
Potassium: 3.7 mmol/L (ref 3.5–5.1)
Sodium: 137 mmol/L (ref 135–145)
Total Bilirubin: 0.4 mg/dL (ref <1.2)
Total Protein: 6.9 g/dL (ref 6.5–8.1)

## 2023-03-01 LAB — SURGICAL PCR SCREEN
MRSA, PCR: NEGATIVE
Staphylococcus aureus: NEGATIVE

## 2023-03-01 NOTE — Patient Instructions (Addendum)
Your procedure is scheduled on:  Thursday November 21  Report to the Registration Desk on the 1st floor of the [facility] To find out your arrival time, please call [phone] between 1PM - 3PM on:  Wednesday November 20  If your arrival time is 6:00 am, do not arrive before that time as the [facility] entrance doors do not open until 6:00 am.  REMEMBER: Instructions that are not followed completely may result in serious medical risk, up to and including death; or upon the discretion of your surgeon and anesthesiologist your surgery may need to be rescheduled.  Do not eat food after midnight the night before surgery.  No gum chewing or hard candies.  You may however, drink CLEAR liquids up to 2 hours before you are scheduled to arrive for your surgery. Do not drink anything within 2 hours of your scheduled arrival time.  Clear liquids include: - water  - apple juice without pulp - gatorade (not RED colors) - black coffee or tea (Do NOT add milk or creamers to the coffee or tea) Do NOT drink anything that is not on this list.  In addition, your doctor has ordered for you to drink the provided:  Ensure Pre-Surgery Clear Carbohydrate Drink  Drinking this carbohydrate drink up to two hours before surgery helps to reduce insulin resistance and improve patient outcomes. Please complete drinking 2 hours before scheduled arrival time.  One week prior to surgery: Thursday November 14  Stop Anti-inflammatories (NSAIDS) such as Advil, Aleve, Ibuprofen, Motrin, Naproxen, Naprosyn and Aspirin based products such as Excedrin, Goody's Powder, BC Powder.  Stop ANY OVER THE COUNTER supplements until after surgery per PCP. Tumeric meloxicam (MOBIC)  omega-3 acid ethyl esters (LOVAZA)  potassium chloride SA (K-DUR,KLOR-CON)   You may however, continue to take Tylenol if needed for pain up until the day of surgery.  Continue taking all of your other prescription medications up until the day  of surgery.  ON THE DAY OF SURGERY ONLY TAKE THESE MEDICATIONS WITH SIPS OF WATER:  carvedilol (COREG)  escitalopram (LEXAPRO)  hydroxychloroquine (PLAQUENIL)  pantoprazole (PROTONIX)   No Alcohol for 24 hours before or after surgery.  No Smoking including e-cigarettes for 24 hours before surgery.  No chewable tobacco products for at least 6 hours before surgery.  No nicotine patches on the day of surgery.  Do not use any "recreational" drugs for at least a week (preferably 2 weeks) before your surgery.  Please be advised that the combination of cocaine and anesthesia may have negative outcomes, up to and including death. If you test positive for cocaine, your surgery will be cancelled.  On the morning of surgery brush your teeth with toothpaste and water, you may rinse your mouth with mouthwash if you wish. Do not swallow any toothpaste or mouthwash.  Use CHG Soap as directed on instruction sheet.  Do not wear jewelry, make-up, hairpins, clips or nail polish.  For welded (permanent) jewelry: bracelets, anklets, waist bands, etc.  Please have this removed prior to surgery.  If it is not removed, there is a chance that hospital personnel will need to cut it off on the day of surgery.  Do not wear lotions, powders, or perfumes.   Do not shave body hair from the neck down 48 hours before surgery.  Contact lenses, hearing aids and dentures may not be worn into surgery.  Do not bring valuables to the hospital. [facility] is not responsible for any missing/lost belongings or  valuables.   Notify your doctor if there is any change in your medical condition (cold, fever, infection).  Wear comfortable clothing (specific to your surgery type) to the hospital.  After surgery, you can help prevent lung complications by doing breathing exercises.  Take deep breaths and cough every 1-2 hours. Your doctor may order a device called an Incentive Spirometer to help you take deep breaths.  If  you are being admitted to the hospital overnight, leave your suitcase in the car. After surgery it may be brought to your room.  In case of increased patient census, it may be necessary for you, the patient, to continue your postoperative care in the Same Day Surgery department.  If you are being discharged the day of surgery, you will not be allowed to drive home. You will need a responsible individual to drive you home and stay with you for 24 hours after surgery.   If you are taking public transportation, you will need to have a responsible individual with you.  Please call the Pre-admissions Testing Dept. at [phone] if you have any questions about these instructions.  Surgery Visitation Policy:  Patients having surgery or a procedure may have two visitors.  Children under the age of 68 must have an adult with them who is not the patient.  Inpatient Visitation:    Visiting hours are 7 a.m. to 8 p.m. Up to four visitors are allowed at one time in a patient room. The visitors may rotate out with other people during the day.  One visitor age 2 or older may stay with the patient overnight and must be in the room by 8 p.m.                Pre-operative 5 CHG Bath Instructions   You can play a key role in reducing the risk of infection after surgery. Your skin needs to be as free of germs as possible. You can reduce the number of germs on your skin by washing with CHG (chlorhexidine gluconate) soap before surgery. CHG is an antiseptic soap that kills germs and continues to kill germs even after washing.   DO NOT use if you have an allergy to chlorhexidine/CHG or antibacterial soaps. If your skin becomes reddened or irritated, stop using the CHG and notify one of our RNs at [phone].   Please shower with the CHG soap starting 4 days before surgery using the following schedule:     Please keep in mind the following:  DO NOT shave, including legs and underarms,  starting the day of your first shower.   You may shave your face at any point before/day of surgery.  Place clean sheets on your bed the day you start using CHG soap. Use a clean washcloth (not used since being washed) for each shower. DO NOT sleep with pets once you start using the CHG.   CHG Shower Instructions:  If you choose to wash your hair and private area, wash first with your normal shampoo/soap.  After you use shampoo/soap, rinse your hair and body thoroughly to remove shampoo/soap residue.  Turn the water OFF and apply about 3 tablespoons (45 ml) of CHG soap to a CLEAN washcloth.  Apply CHG soap ONLY FROM YOUR NECK DOWN TO YOUR TOES (washing for 3-5 minutes)  DO NOT use CHG soap on face, private areas, open wounds, or sores.  Pay special attention to the area where your surgery is being performed.  If you are having back  surgery, having someone wash your back for you may be helpful. Wait 2 minutes after CHG soap is applied, then you may rinse off the CHG soap.  Pat dry with a clean towel  Put on clean clothes/pajamas   If you choose to wear lotion, please use ONLY the CHG-compatible lotions on the back of this paper.     Additional instructions for the day of surgery: DO NOT APPLY any lotions, deodorants, cologne, or perfumes.   Put on clean/comfortable clothes.  Brush your teeth.  Ask your nurse before applying any prescription medications to the skin.       CHG Compatible Lotions   Aveeno Moisturizing lotion  Cetaphil Moisturizing Cream  Cetaphil Moisturizing Lotion  Clairol Herbal Essence Moisturizing Lotion, Dry Skin  Clairol Herbal Essence Moisturizing Lotion, Extra Dry Skin  Clairol Herbal Essence Moisturizing Lotion, Normal Skin  Curel Age Defying Therapeutic Moisturizing Lotion with Alpha Hydroxy  Curel Extreme Care Body Lotion  Curel Soothing Hands Moisturizing Hand Lotion  Curel Therapeutic Moisturizing Cream, Fragrance-Free  Curel Therapeutic  Moisturizing Lotion, Fragrance-Free  Curel Therapeutic Moisturizing Lotion, Original Formula  Eucerin Daily Replenishing Lotion  Eucerin Dry Skin Therapy Plus Alpha Hydroxy Crme  Eucerin Dry Skin Therapy Plus Alpha Hydroxy Lotion  Eucerin Original Crme  Eucerin Original Lotion  Eucerin Plus Crme Eucerin Plus Lotion  Eucerin TriLipid Replenishing Lotion  Keri Anti-Bacterial Hand Lotion  Keri Deep Conditioning Original Lotion Dry Skin Formula Softly Scented  Keri Deep Conditioning Original Lotion, Fragrance Free Sensitive Skin Formula  Keri Lotion Fast Absorbing Fragrance Free Sensitive Skin Formula  Keri Lotion Fast Absorbing Softly Scented Dry Skin Formula  Keri Original Lotion  Keri Skin Renewal Lotion Keri Silky Smooth Lotion  Keri Silky Smooth Sensitive Skin Lotion  Nivea Body Creamy Conditioning Oil  Nivea Body Extra Enriched Lotion  Nivea Body Original Lotion  Nivea Body Sheer Moisturizing Lotion Nivea Crme  Nivea Skin Firming Lotion  NutraDerm 30 Skin Lotion  NutraDerm Skin Lotion  NutraDerm Therapeutic Skin Cream  NutraDerm Therapeutic Skin Lotion  ProShield Protective Hand Cream  Provon moisturizing lotion                      How to Use an Incentive Spirometer  An incentive spirometer is a tool that measures how well you are filling your lungs with each breath. Learning to take long, deep breaths using this tool can help you keep your lungs clear and active. This may help to reverse or lessen your chance of developing breathing (pulmonary) problems, especially infection. You may be asked to use a spirometer: After a surgery. If you have a lung problem or a history of smoking. After a long period of time when you have been unable to move or be active. If the spirometer includes an indicator to show the highest number that you have reached, your health care provider or respiratory therapist will help you set a goal. Keep a log of your progress as told  by your health care provider. What are the risks? Breathing too quickly may cause dizziness or cause you to pass out. Take your time so you do not get dizzy or light-headed. If you are in pain, you may need to take pain medicine before doing incentive spirometry. It is harder to take a deep breath if you are having pain. How to use your incentive spirometer  Sit up on the edge of your bed or on a chair. Hold the incentive spirometer so that  it is in an upright position. Before you use the spirometer, breathe out normally. Place the mouthpiece in your mouth. Make sure your lips are closed tightly around it. Breathe in slowly and as deeply as you can through your mouth, causing the piston or the ball to rise toward the top of the chamber. Hold your breath for 3-5 seconds, or for as long as possible. If the spirometer includes a coach indicator, use this to guide you in breathing. Slow down your breathing if the indicator goes above the marked areas. Remove the mouthpiece from your mouth and breathe out normally. The piston or ball will return to the bottom of the chamber. Rest for a few seconds, then repeat the steps 10 or more times. Take your time and take a few normal breaths between deep breaths so that you do not get dizzy or light-headed. Do this every 1-2 hours when you are awake. If the spirometer includes a goal marker to show the highest number you have reached (best effort), use this as a goal to work toward during each repetition. After each set of 10 deep breaths, cough a few times. This will help to make sure that your lungs are clear. If you have an incision on your chest or abdomen from surgery, place a pillow or a rolled-up towel firmly against the incision when you cough. This can help to reduce pain while taking deep breaths and coughing. General tips When you are able to get out of bed: Walk around often. Continue to take deep breaths and cough in order to clear your  lungs. Keep using the incentive spirometer until your health care provider says it is okay to stop using it. If you have been in the hospital, you may be told to keep using the spirometer at home. Contact a health care provider if: You are having difficulty using the spirometer. You have trouble using the spirometer as often as instructed. Your pain medicine is not giving enough relief for you to use the spirometer as told. You have a fever. Get help right away if: You develop shortness of breath. You develop a cough with bloody mucus from the lungs. You have fluid or blood coming from an incision site after you cough. Summary An incentive spirometer is a tool that can help you learn to take long, deep breaths to keep your lungs clear and active. You may be asked to use a spirometer after a surgery, if you have a lung problem or a history of smoking, or if you have been inactive for a long period of time. Use your incentive spirometer as instructed every 1-2 hours while you are awake. If you have an incision on your chest or abdomen, place a pillow or a rolled-up towel firmly against your incision when you cough. This will help to reduce pain. Get help right away if you have shortness of breath, you cough up bloody mucus, or blood comes from your incision when you cough. This information is not intended to replace advice given to you by your health care provider. Make sure you discuss any questions you have with your health care provider. Document Revised: 06/26/2019 Document Reviewed: 06/26/2019 Elsevier Patient Education  2023 [employer]                 Preoperative Educational Videos for Total Hip, Knee and Shoulder Replacements  To better prepare for surgery, please view our videos that explain the physical activity and discharge planning required to have  the best surgical recovery at [facility].  [email or web address]  Questions?  Call [phone] or email [email or web address]

## 2023-03-03 DIAGNOSIS — M170 Bilateral primary osteoarthritis of knee: Secondary | ICD-10-CM | POA: Diagnosis not present

## 2023-03-03 DIAGNOSIS — M25562 Pain in left knee: Secondary | ICD-10-CM | POA: Diagnosis not present

## 2023-03-03 DIAGNOSIS — M25561 Pain in right knee: Secondary | ICD-10-CM | POA: Diagnosis not present

## 2023-03-08 DIAGNOSIS — M25562 Pain in left knee: Secondary | ICD-10-CM | POA: Diagnosis not present

## 2023-03-08 DIAGNOSIS — M170 Bilateral primary osteoarthritis of knee: Secondary | ICD-10-CM | POA: Diagnosis not present

## 2023-03-08 DIAGNOSIS — M25561 Pain in right knee: Secondary | ICD-10-CM | POA: Diagnosis not present

## 2023-03-10 MED ORDER — CHLORHEXIDINE GLUCONATE 0.12 % MT SOLN
15.0000 mL | Freq: Once | OROMUCOSAL | Status: AC
  Administered 2023-03-11: 15 mL via OROMUCOSAL

## 2023-03-10 MED ORDER — CEFAZOLIN SODIUM-DEXTROSE 2-4 GM/100ML-% IV SOLN
2.0000 g | INTRAVENOUS | Status: AC
  Administered 2023-03-11: 2 g via INTRAVENOUS

## 2023-03-10 MED ORDER — TRANEXAMIC ACID-NACL 1000-0.7 MG/100ML-% IV SOLN
1000.0000 mg | INTRAVENOUS | Status: AC
  Administered 2023-03-11 (×2): 1000 mg via INTRAVENOUS

## 2023-03-10 MED ORDER — LACTATED RINGERS IV SOLN
INTRAVENOUS | Status: DC
Start: 2023-03-10 — End: 2023-03-11

## 2023-03-10 MED ORDER — ORAL CARE MOUTH RINSE: 15.0000 mL | Freq: Once | OROMUCOSAL | Status: AC

## 2023-03-10 MED ORDER — DEXAMETHASONE SODIUM PHOSPHATE 10 MG/ML IJ SOLN
8.0000 mg | Freq: Once | INTRAMUSCULAR | Status: AC
  Administered 2023-03-11: 8 mg via INTRAVENOUS

## 2023-03-11 ENCOUNTER — Encounter
Admission: RE | Disposition: A | Discharge status: Home / Self Care | Payer: Self-pay | Source: Home / Self Care | Attending: Orthopedic Surgery

## 2023-03-11 ENCOUNTER — Other Ambulatory Visit: Payer: Self-pay

## 2023-03-11 ENCOUNTER — Ambulatory Visit: Payer: Medicare Other

## 2023-03-11 ENCOUNTER — Ambulatory Visit: Payer: Medicare Other | Admitting: Urgent Care

## 2023-03-11 ENCOUNTER — Ambulatory Visit: Payer: Medicare Other | Admitting: General Practice

## 2023-03-11 ENCOUNTER — Encounter: Payer: Self-pay | Admitting: Orthopedic Surgery

## 2023-03-11 ENCOUNTER — Observation Stay
Admission: RE | Admit: 2023-03-11 | Discharge: 2023-03-12 | Disposition: A | Discharge status: Home / Self Care | Payer: Medicare Other | Attending: Orthopedic Surgery | Admitting: Orthopedic Surgery

## 2023-03-11 DIAGNOSIS — Z96651 Presence of right artificial knee joint: Secondary | ICD-10-CM | POA: Diagnosis not present

## 2023-03-11 DIAGNOSIS — I10 Essential (primary) hypertension: Secondary | ICD-10-CM | POA: Diagnosis not present

## 2023-03-11 DIAGNOSIS — M1711 Unilateral primary osteoarthritis, right knee: Principal | ICD-10-CM | POA: Insufficient documentation

## 2023-03-11 DIAGNOSIS — M170 Bilateral primary osteoarthritis of knee: Secondary | ICD-10-CM | POA: Diagnosis not present

## 2023-03-11 DIAGNOSIS — E039 Hypothyroidism, unspecified: Secondary | ICD-10-CM | POA: Diagnosis not present

## 2023-03-11 DIAGNOSIS — Z79899 Other long term (current) drug therapy: Secondary | ICD-10-CM | POA: Insufficient documentation

## 2023-03-11 HISTORY — PX: TOTAL KNEE ARTHROPLASTY: SHX125

## 2023-03-11 SURGERY — ARTHROPLASTY, KNEE, TOTAL
Anesthesia: Spinal | Site: Knee | Laterality: Right

## 2023-03-11 MED ORDER — PHENOL 1.4 % MT LIQD: 1.0000 | OROMUCOSAL | Status: DC | PRN

## 2023-03-11 MED ORDER — LORAZEPAM 1 MG PO TABS
1.0000 mg | ORAL_TABLET | Freq: Every evening | ORAL | Status: DC | PRN
  Administered 2023-03-11: 1 mg via ORAL
  Filled 2023-03-11 (×3): qty 1

## 2023-03-11 MED ORDER — TRAMADOL HCL 50 MG PO TABS: 50.0000 mg | ORAL_TABLET | Freq: Four times a day (QID) | ORAL | Status: DC | PRN

## 2023-03-11 MED ORDER — HYDROCODONE-ACETAMINOPHEN 5-325 MG PO TABS
1.0000 | ORAL_TABLET | ORAL | Status: DC | PRN
  Administered 2023-03-11 – 2023-03-12 (×2): 2 via ORAL

## 2023-03-11 MED ORDER — SODIUM CHLORIDE 0.9 % IV SOLN
INTRAVENOUS | Status: DC
Start: 2023-03-11 — End: 2023-03-12

## 2023-03-11 MED ORDER — KETOROLAC TROMETHAMINE 15 MG/ML IJ SOLN
7.5000 mg | Freq: Four times a day (QID) | INTRAMUSCULAR | Status: AC
Start: 2023-03-11 — End: 2023-03-12
  Administered 2023-03-11 – 2023-03-12 (×4): 7.5 mg via INTRAVENOUS

## 2023-03-11 MED ORDER — ATORVASTATIN CALCIUM 20 MG PO TABS
80.0000 mg | ORAL_TABLET | Freq: Every day | ORAL | Status: DC
  Administered 2023-03-12: 80 mg via ORAL

## 2023-03-11 MED ORDER — CEFAZOLIN SODIUM-DEXTROSE 2-4 GM/100ML-% IV SOLN
INTRAVENOUS | Status: AC
  Filled 2023-03-11: qty 100

## 2023-03-11 MED ORDER — PROPOFOL 500 MG/50ML IV EMUL
INTRAVENOUS | Status: DC | PRN
  Administered 2023-03-11: 25 ug/kg/min via INTRAVENOUS

## 2023-03-11 MED ORDER — BUPIVACAINE HCL (PF) 0.5 % IJ SOLN
INTRAMUSCULAR | Status: AC
Start: 2023-03-11 — End: ?
  Filled 2023-03-11: qty 10

## 2023-03-11 MED ORDER — OXYCODONE HCL 5 MG PO TABS: 5.0000 mg | ORAL_TABLET | Freq: Once | ORAL | Status: DC | PRN

## 2023-03-11 MED ORDER — ACETAMINOPHEN 500 MG PO TABS
ORAL_TABLET | ORAL | Status: AC
  Filled 2023-03-11: qty 2

## 2023-03-11 MED ORDER — MIDAZOLAM HCL 2 MG/2ML IJ SOLN
INTRAMUSCULAR | Status: AC
Start: 2023-03-11 — End: ?
  Filled 2023-03-11: qty 2

## 2023-03-11 MED ORDER — CEFAZOLIN SODIUM-DEXTROSE 2-4 GM/100ML-% IV SOLN
2.0000 g | Freq: Four times a day (QID) | INTRAVENOUS | Status: AC
  Administered 2023-03-11 – 2023-03-12 (×2): 2 g via INTRAVENOUS

## 2023-03-11 MED ORDER — PANTOPRAZOLE SODIUM 40 MG PO TBEC
DELAYED_RELEASE_TABLET | ORAL | Status: AC
  Filled 2023-03-11: qty 1

## 2023-03-11 MED ORDER — CHLORHEXIDINE GLUCONATE 0.12 % MT SOLN
OROMUCOSAL | Status: AC
Start: 2023-03-11 — End: ?
  Filled 2023-03-11: qty 15

## 2023-03-11 MED ORDER — ONDANSETRON HCL 4 MG/2ML IJ SOLN: 4.0000 mg | Freq: Four times a day (QID) | INTRAMUSCULAR | Status: DC | PRN

## 2023-03-11 MED ORDER — FENTANYL CITRATE (PF) 100 MCG/2ML IJ SOLN
INTRAMUSCULAR | Status: DC | PRN
  Administered 2023-03-11 (×2): 50 ug via INTRAVENOUS

## 2023-03-11 MED ORDER — FENTANYL CITRATE (PF) 100 MCG/2ML IJ SOLN: 25.0000 ug | INTRAMUSCULAR | Status: DC | PRN

## 2023-03-11 MED ORDER — KETOROLAC TROMETHAMINE 30 MG/ML IJ SOLN
INTRAMUSCULAR | Status: AC
  Filled 2023-03-11: qty 1

## 2023-03-11 MED ORDER — TRANEXAMIC ACID-NACL 1000-0.7 MG/100ML-% IV SOLN
INTRAVENOUS | Status: AC
  Filled 2023-03-11: qty 100

## 2023-03-11 MED ORDER — CARVEDILOL 3.125 MG PO TABS
ORAL_TABLET | ORAL | Status: AC
  Filled 2023-03-11: qty 2

## 2023-03-11 MED ORDER — CYCLOBENZAPRINE HCL 5 MG PO TABS: 5.0000 mg | ORAL_TABLET | Freq: Every evening | ORAL | Status: DC | PRN

## 2023-03-11 MED ORDER — LORATADINE 10 MG PO TABS
10.0000 mg | ORAL_TABLET | Freq: Every day | ORAL | Status: DC
  Administered 2023-03-12: 10 mg via ORAL

## 2023-03-11 MED ORDER — DOCUSATE SODIUM 100 MG PO CAPS: 100.0000 mg | ORAL_CAPSULE | Freq: Two times a day (BID) | ORAL | Status: DC

## 2023-03-11 MED ORDER — FENTANYL CITRATE (PF) 100 MCG/2ML IJ SOLN
INTRAMUSCULAR | Status: AC
  Filled 2023-03-11: qty 2

## 2023-03-11 MED ORDER — PANTOPRAZOLE SODIUM 40 MG PO TBEC
40.0000 mg | DELAYED_RELEASE_TABLET | Freq: Every day | ORAL | Status: DC
  Administered 2023-03-11 – 2023-03-12 (×2): 40 mg via ORAL

## 2023-03-11 MED ORDER — KETOROLAC TROMETHAMINE 15 MG/ML IJ SOLN
INTRAMUSCULAR | Status: AC
  Filled 2023-03-11: qty 1

## 2023-03-11 MED ORDER — DEXAMETHASONE SODIUM PHOSPHATE 10 MG/ML IJ SOLN
INTRAMUSCULAR | Status: AC
  Filled 2023-03-11: qty 1

## 2023-03-11 MED ORDER — MORPHINE SULFATE (PF) 4 MG/ML IV SOLN
0.5000 mg | INTRAVENOUS | Status: DC | PRN
Start: 2023-03-11 — End: 2023-03-12

## 2023-03-11 MED ORDER — PROPOFOL 1000 MG/100ML IV EMUL
INTRAVENOUS | Status: AC
Start: 2023-03-11 — End: ?
  Filled 2023-03-11: qty 100

## 2023-03-11 MED ORDER — MENTHOL 3 MG MT LOZG: 1.0000 | LOZENGE | OROMUCOSAL | Status: DC | PRN

## 2023-03-11 MED ORDER — TRANEXAMIC ACID-NACL 1000-0.7 MG/100ML-% IV SOLN
INTRAVENOUS | Status: AC
Start: 2023-03-11 — End: ?
  Filled 2023-03-11: qty 100

## 2023-03-11 MED ORDER — METOCLOPRAMIDE HCL 10 MG PO TABS: 5.0000 mg | ORAL_TABLET | Freq: Three times a day (TID) | ORAL | Status: DC | PRN

## 2023-03-11 MED ORDER — HYDROCODONE-ACETAMINOPHEN 5-325 MG PO TABS
ORAL_TABLET | ORAL | Status: AC
  Filled 2023-03-11: qty 2

## 2023-03-11 MED ORDER — GLYCOPYRROLATE 0.2 MG/ML IJ SOLN
INTRAMUSCULAR | Status: AC
  Filled 2023-03-11: qty 1

## 2023-03-11 MED ORDER — OXYCODONE HCL 5 MG/5ML PO SOLN: 5.0000 mg | Freq: Once | ORAL | Status: DC | PRN

## 2023-03-11 MED ORDER — HYDROXYCHLOROQUINE SULFATE 200 MG PO TABS
200.0000 mg | ORAL_TABLET | Freq: Two times a day (BID) | ORAL | Status: DC
Start: 2023-03-11 — End: 2023-03-12
  Administered 2023-03-11 – 2023-03-12 (×2): 200 mg via ORAL
  Filled 2023-03-11 (×2): qty 1

## 2023-03-11 MED ORDER — MIDAZOLAM HCL 5 MG/5ML IJ SOLN
INTRAMUSCULAR | Status: DC | PRN
  Administered 2023-03-11 (×2): 1 mg via INTRAVENOUS

## 2023-03-11 MED ORDER — METOCLOPRAMIDE HCL 5 MG/ML IJ SOLN: 5.0000 mg | Freq: Three times a day (TID) | INTRAMUSCULAR | Status: DC | PRN

## 2023-03-11 MED ORDER — SODIUM CHLORIDE 0.9 % IR SOLN
Status: DC | PRN
  Administered 2023-03-11: 3000 mL

## 2023-03-11 MED ORDER — GLYCOPYRROLATE 0.2 MG/ML IJ SOLN
INTRAMUSCULAR | Status: DC | PRN
  Administered 2023-03-11: .2 mg via INTRAVENOUS

## 2023-03-11 MED ORDER — HYDROCHLOROTHIAZIDE 25 MG PO TABS
25.0000 mg | ORAL_TABLET | Freq: Every day | ORAL | Status: DC
  Administered 2023-03-12: 25 mg via ORAL

## 2023-03-11 MED ORDER — ONDANSETRON HCL 4 MG PO TABS
4.0000 mg | ORAL_TABLET | Freq: Four times a day (QID) | ORAL | Status: DC | PRN
Start: 2023-03-11 — End: 2023-03-12

## 2023-03-11 MED ORDER — ACETAMINOPHEN 325 MG PO TABS: 325.0000 mg | ORAL_TABLET | Freq: Four times a day (QID) | ORAL | Status: DC | PRN

## 2023-03-11 MED ORDER — CARVEDILOL 3.125 MG PO TABS
6.2500 mg | ORAL_TABLET | Freq: Two times a day (BID) | ORAL | Status: DC
  Administered 2023-03-11 – 2023-03-12 (×2): 6.25 mg via ORAL

## 2023-03-11 MED ORDER — SODIUM CHLORIDE (PF) 0.9 % IJ SOLN
INTRAMUSCULAR | Status: DC | PRN
  Administered 2023-03-11: 71 mL via INTRAMUSCULAR

## 2023-03-11 MED ORDER — ENOXAPARIN SODIUM 30 MG/0.3ML IJ SOSY
30.0000 mg | PREFILLED_SYRINGE | Freq: Two times a day (BID) | INTRAMUSCULAR | Status: DC
  Administered 2023-03-12: 30 mg via SUBCUTANEOUS

## 2023-03-11 MED ORDER — BUPIVACAINE HCL (PF) 0.5 % IJ SOLN
INTRAMUSCULAR | Status: DC | PRN
  Administered 2023-03-11: 2.6 mL via INTRATHECAL

## 2023-03-11 MED ORDER — ACETAMINOPHEN 500 MG PO TABS
1000.0000 mg | ORAL_TABLET | Freq: Three times a day (TID) | ORAL | Status: DC
Start: 2023-03-11 — End: 2023-03-12
  Administered 2023-03-11 – 2023-03-12 (×3): 1000 mg via ORAL

## 2023-03-11 MED ORDER — DOCUSATE SODIUM 100 MG PO CAPS
ORAL_CAPSULE | ORAL | Status: AC
  Filled 2023-03-11: qty 1

## 2023-03-11 MED ORDER — SURGIPHOR WOUND IRRIGATION SYSTEM - OPTIME: TOPICAL | Status: DC | PRN

## 2023-03-11 SURGICAL SUPPLY — 70 items
BLADE PATELLA REAM PILOT HOLE (MISCELLANEOUS) IMPLANT
BLADE SAGITTAL AGGR TOOTH XLG (BLADE) IMPLANT
BLADE SAW SAG 25X90X1.19 (BLADE) ×1 IMPLANT
BLADE SAW SAG 29X58X.64 (BLADE) ×1 IMPLANT
BNDG ELASTIC 6INX 5YD STR LF (GAUZE/BANDAGES/DRESSINGS) ×1 IMPLANT
BOWL CEMENT MIX W/ADAPTER (MISCELLANEOUS) ×1 IMPLANT
CEMENT BONE R 1X40 (Cement) ×2 IMPLANT
CHLORAPREP W/TINT 26 (MISCELLANEOUS) ×2 IMPLANT
COMP FEM CEMT PERS SZ7 RT (Joint) ×1 IMPLANT
COMPONENT FEM CEMT PERS SZ7 RT (Joint) IMPLANT
COOLER POLAR GLACIER W/PUMP (MISCELLANEOUS) ×1 IMPLANT
CUFF TRNQT CYL 24X4X16.5-23 (TOURNIQUET CUFF) IMPLANT
CUFF TRNQT CYL 30X4X21-28X (TOURNIQUET CUFF) IMPLANT
DERMABOND ADVANCED .7 DNX12 (GAUZE/BANDAGES/DRESSINGS) ×1 IMPLANT
DRAPE INCISE IOBAN 66X60 STRL (DRAPES) IMPLANT
DRAPE SHEET LG 3/4 BI-LAMINATE (DRAPES) ×1 IMPLANT
DRSG MEPILEX SACRM 8.7X9.8 (GAUZE/BANDAGES/DRESSINGS) ×1 IMPLANT
DRSG OPSITE POSTOP 4X10 (GAUZE/BANDAGES/DRESSINGS) IMPLANT
DRSG OPSITE POSTOP 4X8 (GAUZE/BANDAGES/DRESSINGS) IMPLANT
ELECT REM PT RETURN 9FT ADLT (ELECTROSURGICAL) ×1
ELECTRODE REM PT RTRN 9FT ADLT (ELECTROSURGICAL) ×1 IMPLANT
GLOVE BIO SURGEON STRL SZ8 (GLOVE) ×1 IMPLANT
GLOVE BIOGEL PI IND STRL 8 (GLOVE) ×1 IMPLANT
GLOVE PI ORTHO PRO STRL 7.5 (GLOVE) ×2 IMPLANT
GLOVE PI ORTHO PRO STRL SZ8 (GLOVE) ×2 IMPLANT
GLOVE SURG SYN 7.5 E (GLOVE) ×1 IMPLANT
GLOVE SURG SYN 7.5 PF PI (GLOVE) ×1 IMPLANT
GOWN SRG XL LVL 3 NONREINFORCE (GOWNS) ×1 IMPLANT
GOWN STRL REUS W/ TWL LRG LVL3 (GOWN DISPOSABLE) ×1 IMPLANT
GOWN STRL REUS W/ TWL XL LVL3 (GOWN DISPOSABLE) ×1 IMPLANT
HOLDER FOLEY CATH W/STRAP (MISCELLANEOUS) IMPLANT
HOOD PEEL AWAY T7 (MISCELLANEOUS) ×2 IMPLANT
INSERT TIB ASF SZ 6-7/EF 10 RT (Insert) IMPLANT
IV NS IRRIG 3000ML ARTHROMATIC (IV SOLUTION) ×1 IMPLANT
KIT TURNOVER KIT A (KITS) ×1 IMPLANT
MANIFOLD NEPTUNE II (INSTRUMENTS) ×1 IMPLANT
MARKER SKIN DUAL TIP RULER LAB (MISCELLANEOUS) ×1 IMPLANT
MAT ABSORB FLUID 56X50 GRAY (MISCELLANEOUS) ×1 IMPLANT
NDL HYPO 21X1.5 SAFETY (NEEDLE) ×1 IMPLANT
NEEDLE HYPO 21X1.5 SAFETY (NEEDLE) ×1 IMPLANT
PACK TOTAL KNEE (MISCELLANEOUS) ×1 IMPLANT
PAD ARMBOARD 7.5X6 YLW CONV (MISCELLANEOUS) ×3 IMPLANT
PAD WRAPON POLAR KNEE (MISCELLANEOUS) ×1 IMPLANT
PENCIL SMOKE EVACUATOR (MISCELLANEOUS) ×1 IMPLANT
PIN DRILL HDLS TROCAR 75 4PK (PIN) IMPLANT
PULSAVAC PLUS IRRIG FAN TIP (DISPOSABLE) ×1
SCREW FEMALE HEX FIX 25X2.5 (ORTHOPEDIC DISPOSABLE SUPPLIES) IMPLANT
SCREW HEX HEADED 3.5X27 DISP (ORTHOPEDIC DISPOSABLE SUPPLIES) IMPLANT
SLEEVE SCD COMPRESS KNEE MED (STOCKING) ×1 IMPLANT
SOLUTION IRRIG SURGIPHOR (IV SOLUTION) ×1 IMPLANT
STEM POLY PAT PLY 32M KNEE (Knees) IMPLANT
STEM TIB ST PERS 14+30 (Stem) IMPLANT
STEM TIBIA 5 DEG SZ E R KNEE (Knees) IMPLANT
SUT STRATA 1 CT-1 DLB (SUTURE) ×1
SUT STRATAFIX 14 PDO 48 VLT (SUTURE) ×1 IMPLANT
SUT STRATAFIX PDO 1 14 VIOLET (SUTURE) ×1
SUT VIC AB 0 CT1 36 (SUTURE) ×1 IMPLANT
SUT VIC AB 2-0 CT2 27 (SUTURE) ×2 IMPLANT
SUT VICRYL 1-0 27IN ABS (SUTURE) ×1
SUTURE STRATA SPIR 4-0 18 (SUTURE) ×1 IMPLANT
SUTURE VICRYL 1-0 27IN ABS (SUTURE) ×1 IMPLANT
SYR 30ML LL (SYRINGE) ×2 IMPLANT
TAPE CLOTH 3X10 WHT NS LF (GAUZE/BANDAGES/DRESSINGS) ×1 IMPLANT
TIBIA STEM 5 DEG SZ E R KNEE (Knees) ×1 IMPLANT
TIP FAN IRRIG PULSAVAC PLUS (DISPOSABLE) ×1 IMPLANT
TOWEL OR 17X26 4PK STRL BLUE (TOWEL DISPOSABLE) IMPLANT
TRAP FLUID SMOKE EVACUATOR (MISCELLANEOUS) ×1 IMPLANT
TRAY FOL W/BAG SLVR 16FR STRL (SET/KITS/TRAYS/PACK) IMPLANT
WATER STERILE IRR 1000ML POUR (IV SOLUTION) ×1 IMPLANT
WRAPON POLAR PAD KNEE (MISCELLANEOUS) ×1

## 2023-03-11 NOTE — H&P (Signed)
History of Present Illness: The patient is an 77 y.o. female seen in clinic today for history and physical for right total knee arthroplasty with Dr. Audelia Acton on 03/11/2023. Patient has advanced right knee osteoarthritis, tricompartmental most severe in the patellofemoral and medial compartment. She has 9 out of 10 pain along the medial aspect of the knee with frequent sensations of giving away, stabbing sensation and pain when trying to climb stairs. She has undergone physical therapy, steroid and gel injections with little relief. She has tried sleeves and braces on her knee with little relief. She has taken meloxicam, Tylenol and tramadol with only mild relief.  The patient denies fevers, chills, numbness, tingling, shortness of breath, chest pain, recent illness, or any trauma.   The patient is a non-smoker, nondiabetic with an A1c of 5.8, BMI of 30.4 Past Medical History: Past Medical History:  Diagnosis Date  Anemia  Anxiety  Arthritis  Colon polyp  Depression  Estrogen deficiency  Fibromyalgia  History of gastritis 11/03/2018  Hyperlipemia  Hypokalemia  IDA (iron deficiency anemia) 11/03/2018  Insomnia  Thyroid disease   Past Surgical History: Past Surgical History:  Procedure Laterality Date  COLONOSCOPY 06/20/2015  S. Tarazi @ [facility] - Tubulovillous Adenoma (15mm)  EGD 06/20/2015  Gastritis  COLONOSCOPY 07/04/2018  Dr. Maggie Font @ [facility] - Adenomatous Polyps, rpt 3 yrs per [facility]  Colon @ [facility] 09/22/2022  Colon with inadequate prep/Polyp was benign mucosa/Repeat colonoscopy in 1-2 years if benefits outweigh risks/CTL  EGD @ [facility] 09/22/2022  EGD unremarkable/Repeat EGD prn/CTL  EYE SURGERY  HYSTERECTOMY  right foot surgery  STOMACH SURGERY  TONSILLECTOMY & ADENOIDECTOMY   Past Family History: Family History  Problem Relation Age of Onset  Arthritis Mother  Diabetes Father  Liver disease Father  Arthritis Maternal Grandmother  Cancer Maternal Grandmother   Hyperlipidemia (Elevated cholesterol) Maternal Grandfather   Medications: Current Outpatient Medications  Medication Sig Dispense Refill  amLODIPine (NORVASC) 10 MG tablet Take 10 mg by mouth once daily  atorvastatin (LIPITOR) 80 MG tablet Take 80 mg by mouth once daily  carvediloL (COREG) 3.125 MG tablet Take 3.125 mg by mouth 2 (two) times daily with meals  clonazePAM (KLONOPIN) 1 MG tablet Take 1 mg by mouth at bedtime  clonazePAM (KLONOPIN) 1 MG tablet Take 1 tablet by mouth at bedtime  cyclobenzaprine (FLEXERIL) 5 MG tablet Take 1 tablet (5 mg total) by mouth at bedtime as needed for Muscle spasms 30 tablet 5  cyclobenzaprine (FLEXERIL) 5 MG tablet Take 1 tablet (5 mg total) by mouth at bedtime as needed for Muscle spasms 30 tablet 6  diphenhydramine HCl (ALLERGY ORAL) Take by mouth once daily as needed  escitalopram oxalate (LEXAPRO) 10 MG tablet Take 10 mg by mouth once daily  ferrous sulfate (IRON ORAL) Take by mouth once daily Taking Easy Flow iron SLOW RELEASE  fluticasone (FLONASE) 50 mcg/actuation nasal spray Place 2 sprays into both nostrils once daily as needed  hydroCHLOROthiazide (HYDRODIURIL) 25 MG tablet Take 25 mg by mouth once daily  hydroCHLOROthiazide (MICROZIDE) 12.5 mg capsule Take 1 capsule by mouth once daily  hydroxychloroquine (PLAQUENIL) 200 mg tablet Take 1 tablet (200 mg total) by mouth 2 (two) times daily 60 tablet 5  lidocaine (LIDODERM) 5 % patch APPLY 1 PATCH TOPICALLY DAILY TO PAINFUL AREA 12 HOURS PER DAY THEN REMOVE FOR 12 HOURS 10  loratadine (CLARITIN) 10 mg tablet Take 10 mg by mouth once daily  LORazepam (ATIVAN) 1 MG tablet Take 1 mg  by mouth at bedtime 5  meloxicam (MOBIC) 7.5 MG tablet take 1 tablet by mouth every day 30 tablet 1  montelukast (SINGULAIR) 10 mg tablet Take 10 mg by mouth once daily 0  multivitamin-lutein (CENTRUM SILVER) tablet Take 1 tablet by mouth once daily  omega-3 acid ethyl esters (LOVAZA) 1 gram capsule Take 2 capsules by  mouth 2 (two) times daily  omeprazole (PRILOSEC) 20 MG DR capsule Take 20 mg by mouth once daily.  potassium chloride (KLOR-CON) 20 MEQ ER tablet Take 20 mEq by mouth once daily  telmisartan (MICARDIS) 80 MG tablet Take 80 mg by mouth once daily  traMADol (ULTRAM) 50 mg tablet TAKE ONE TABLET BY MOUTH EVERY 8 HOURS AS NEEDED 3   No current facility-administered medications for this visit.   Allergies: Allergies  Allergen Reactions  Amoxicillin-Pot Clavulanate Other (See Comments)  Aspirin Other (See Comments)  Aspirin cream causes rash ? aspercream  Baclofen Other (See Comments)  Made pt feel dizzy  Fluconazole Other (See Comments)  Oxaprozin Other (See Comments)  Sulfasalazine Other (See Comments)    Visit Vitals: Vitals:  02/17/23 1337  BP: (!) 178/100    Review of Systems:  A comprehensive 14 point ROS was performed, reviewed, and the pertinent orthopaedic findings are documented in the HPI.  Physical Exam: General:  Well developed, well nourished, no apparent distress, normal affect, antalgic gait with a cane  HEENT: Head normocephalic, atraumatic, PERRL.   Abdomen: Soft, non tender, non distended, Bowel sounds present.  Heart: Examination of the heart reveals regular, rate, and rhythm. There is no murmur noted on ascultation. There is a normal apical pulse.  Lungs: Lungs are clear to auscultation. There is no wheeze, rhonchi, or crackles. There is normal expansion of bilateral chest walls.  Comprehensive Knee Exam: Gait Non-antalgic and fluid  Alignment Neutral   Inspection Right Left  Skin Normal appearance with no obvious deformity. No ecchymosis or erythema. Normal appearance with no obvious deformity. No ecchymosis or erythema.  Soft Tissue No focal soft tissue swelling No focal soft tissue swelling  Quad Atrophy None None   Palpation  Right Left  Tenderness Medial joint line and parapatellar tenderness to palpation Medial joint line tenderness to  palpation  Crepitus + patellofemoral and tibiofemoral crepitus + patellofemoral and tibiofemoral crepitus  Effusion None None   Range of Motion Right Left  Flexion 25-95 15-115  Extension 25 degree flexion contracture 15 degree flexion contracture   Ligamentous Exam Right Left  Lachman Normal Normal  Valgus 0 Partially correctable with endpoint Normal  Valgus 30 Normal Normal  Varus 0 Normal Normal  Varus 30 Normal Normal  Anterior Drawer Normal Normal  Posterior Drawer Normal Normal   Meniscal Exam Right Left  Hyperflexion Test Positive Positive  Hyperextension Test Positive Positive  McMurray's Positive Negative   Neurovascular Right Left  Quadriceps Strength 5/5 5/5  Hamstring Strength 5/5 5/5  Hip Abductor Strength 4/5 4/5  Distal Motor Normal Normal  Distal Sensory Normal light touch sensation Normal light touch sensation  Distal Pulses Normal Normal    Imaging Studies: I have reviewed AP, lateral,sunrise, and flexed PA weight bearing knee X-rays (4 views) of the right knee from 11/03/2022. Show moderate degenerative changes with medial, lateral, and patellofemoral joint space narrowing with irregularity of the distal femoral condyles with distorted bony morphology and osteophytes along the medial and lateral joint. The tibia is subluxed laterally relative to the femur. The patellofemoral joint is severe with bone-on-bone articulation osteophyte formation  and subchondral cyst. AP, sunrise, and flexed PA of the left knee also show medial and lateral joint space narrowing with osteophyte formation and irregularity of the distal femoral surface as well as sclerosis. Patellofemoral joint has severe degenerative changes with a large bony osteophyte distorting the trochlea bone-on-bone articulation sclerosis and subchondral cyst formation. Kellgren-Lawrence grade 3/4 in both knees no fractures or dislocations noted.   Assessment:  Advanced right knee  osteoarthritis  Plan: Selia is a 77 year old female with severe right knee osteoarthritis. Pain has been severe and debilitating for many years. She has had no relief with conservative treatment. Pain interferes with her quality of life and activities day living. Risks, benefits, complications of a right total knee arthroplasty have discussed with the patient. Patient has agreed and consented procedure with Dr. Audelia Acton on 03/11/2023.  The hospitalization and post-operative care and rehabilitation were also discussed. The use of perioperative antibiotics and DVT prophylaxis were discussed. The risk, benefits and alternatives to a surgical intervention were discussed at length with the patient. The patient was also advised of risks related to the medical comorbidities and elevated body mass index (BMI). A lengthy discussion took place to review the most common complications including but not limited to: stiffness, loss of function, complex regional pain syndrome, deep vein thrombosis, pulmonary embolus, heart attack, stroke, infection, wound breakdown, numbness, intraoperative fracture, damage to nerves, tendon,muscles, arteries or other blood vessels, death and other possible complications from anesthesia. The patient was told that we will take steps to minimize these risks by using sterile technique, antibiotics and DVT prophylaxis when appropriate and follow the patient postoperatively in the office setting to monitor progress. The possibility of recurrent pain, no improvement in pain and actual worsening of pain were also discussed with the patient.   The patient received clearance for surgery and agrees with plan for right total knee arthroplasty. Reviewed allergy history, has tolerated aspirin well in past and only had issues with topical aspercreme.

## 2023-03-11 NOTE — Discharge Instructions (Addendum)

## 2023-03-11 NOTE — Transfer of Care (Signed)
Immediate Anesthesia Transfer of Care Note  Patient: Audrey Hamilton  Procedure(s) Performed: TOTAL KNEE ARTHROPLASTY (Right: Knee)  Patient Location: PACU  Anesthesia Type:Spinal  Level of Consciousness: awake, alert , and oriented  Airway & Oxygen Therapy: Patient Spontanous Breathing  Post-op Assessment: Report given to RN and Post -op Vital signs reviewed and stable  Post vital signs: Reviewed and stable  Last Vitals:  Vitals Value Taken Time  BP 121/62 03/11/23 1526  Temp    Pulse 67 03/11/23 1527  Resp 16 03/11/23 1527  SpO2 95 % 03/11/23 1527  Vitals shown include unfiled device data.  Last Pain:  Vitals:   03/11/23 1145  TempSrc: Temporal  PainSc: 7          Complications: No notable events documented.

## 2023-03-11 NOTE — Anesthesia Procedure Notes (Signed)
Spinal  Patient location during procedure: OR Reason for block: surgical anesthesia Staffing Performed: resident/CRNA  Resident/CRNA: Mathews Argyle, CRNA Performed by: Mathews Argyle, CRNA Authorized by: Stephanie Coup, MD   Preanesthetic Checklist Completed: patient identified, IV checked, site marked, risks and benefits discussed, surgical consent, monitors and equipment checked, pre-op evaluation and timeout performed Spinal Block Patient position: sitting Prep: ChloraPrep and site prepped and draped Patient monitoring: heart rate, continuous pulse ox, blood pressure and cardiac monitor Approach: midline Location: L4-5 Injection technique: single-shot Needle Needle type: Introducer and Pencan  Needle gauge: 24 G Needle length: 9 cm Assessment Events: CSF return Additional Notes Negative paresthesia. Negative blood return. Positive free-flowing CSF. Expiration date of kit checked and confirmed. Patient tolerated procedure well, without complications.

## 2023-03-11 NOTE — Op Note (Signed)
Patient Name: Audrey Hamilton  NWG:[id number]  Pre-Operative Diagnosis: Right knee Osteoarthritis  Post-Operative Diagnosis: (same)  Procedure: Right Total Knee Arthroplasty  Components/Implants: Femur: Persona Size 7 CR narrow   Tibia: Persona Size E w/ 14x67mm stem extension  Poly: 10mm MC  Patella: 32x8.5 symmetric  Femoral Valgus Cut Angle: 5 degrees  Distal Femoral Re-cut: +44mm   Patella Resurfacing: yes   Date of Surgery: 03/11/2023  Surgeon: Reinaldo Berber MD  Assistant: Amador Cunas PA (present and scrubbed throughout the case, critical for assistance with exposure, retraction, instrumentation, and closure)   Anesthesiologist: Lorette Ang  Anesthesia: Spinal   Tourniquet Time: 58 min  EBL: 50cc  IVF: 500cc  Complications: None   Brief history: The patient is a 77 year old female with a history of osteoarthritis of the right knee with pain limiting their range of motion and activities of daily living, which has failed multiple attempts at conservative therapy.  The risks and benefits of total knee arthroplasty as definitive surgical treatment were discussed with the patient, who opted to proceed with the operation.  After outpatient medical clearance and optimization was completed the patient was admitted to [facility] for the procedure.  All preoperative films were reviewed and an appropriate surgical plan was made prior to surgery. Preoperative range of motion was 25 to 100 with a 15 flexion contracture.   Description of procedure: The patient was brought to the operating room where laterality was confirmed by all those present to be the right side.   Spinal anesthesia was administered and the patient received an intravenous dose of antibiotics for surgical prophylaxis and a dose of tranexamic acid.  Patient is positioned supine on the operating room table with all bony prominences well-padded.  A well-padded tourniquet was applied to the right  thigh.  The knee was then prepped and draped in usual sterile fashion with multiple layers of adhesive and nonadhesive drapes.  All of those present in the operating room participated in a surgical timeout laterality and patient were confirmed.   An Esmarch was wrapped around the extremity and the leg was elevated and the knee flexed.  The tourniquet was inflated to a pressure of 250 mmHg. The Esmarch was removed and the leg was brought down to full extension.  The patella and tibial tubercle identified and outlined using a marking pen and a midline skin incision was made with a knife carried through the subcutaneous tissue down to the extensor retinaculum.  After exposure of the extensor mechanism the medial parapatellar arthrotomy was performed with a scalpel and electrocautery extending down medial and distal to the tibial tubercle taking care to avoid incising the patellar tendon.   A standard medial release was performed over the proximal tibia.  The knee was brought into extension in order to excise the fat pad taking care not to damage the patella tendon.  The superior soft tissue was removed from the anterior surface of the distal femur to visualize for the procedure.  The knee was then brought into flexion with the patella subluxed laterally and subluxing the tibia anteriorly.  The ACL was transected and removed with electrocautery and additional soft tissue was removed from the proximal surface of the tibia to fully expose. The PCL was found to be intact and was preserved.  An extramedullary tibial cutting guide was then applied to the leg with a spring-loaded ankle clamp placed around the distal tibia just above the malleoli the angulation of the guide was adjusted to give some  posterior slope in the tibial resection with an appropriate varus/valgus alignment.  The resection guide was then pinned to the proximal tibia and the proximal tibial surface was resected with an oscillating saw.  Careful  attention was paid to ensure the blade did not disrupt any of the soft tissues including any lateral or medial ligament.  Attention was then turned to the femur, with the knee slightly flexed a opening drill was used to enter the medullary canal of the femur.  After removing the drill marrow was suctioned out to decompress the distal femur.  An intramedullary femoral guide was then inserted into the drill hole and the alignment guide was seated firmly against the distal end of the medial femoral condyle.  The distal femoral cutting guide was then attached and pinned securely to the anterior surface of the femur and the intramedullary rod and alignment guide was removed.  Distal femur resection was then performed with an oscillating saw with retractors protecting medial and laterally.   The distal cutting block was then removed and the extension gap was checked with a spacer.  Extension gap was found to be appropriately sized to accommodate the spacer block. The spacer block was found to be tight and did not allow full extension so the femoral cutting guide was reattached and additional 2 mm of distal femur were resected.   The femoral sizing guide was then placed securely into the posterior condyles of the femur and the femoral size was measured and determined to be 7.  The size 7; 4-in-1 cutting guide was placed in position and secured with 2 pins.  The anterior posterior and chamfer resections were then performed with an oscillating saw.  Bony fragments and osteophytes were then removed.  Using a lamina spreader the posterior medial and lateral condyles were checked for additional osteophytes and posterior soft tissue remnants.  Any remaining meniscus was removed at this time.  Periarticular injection was performed in the meniscal rims and posterior capsule with aspiration performed to ensure no intravascular injection.   The tibia was then exposed and the tibial trial was pinned onto the plateau after  confirming appropriate orientation and rotation.  Using the drill bushing the tibia was prepared to the appropriate drill depth.  Tibial broach impactor was then driven through the punch guide using a mallet.  The femoral trial component was then inserted onto the femur. A trial tibial polyethylene bearing was then placed and the knee was reduced.  The knee achieved full extension with no hyperextension and was found to be balanced in flexion and extension with the trials in place.  The knee was then brought into full extension the patella was everted and held with 2 Kocher clamps.  The articular surface of the patella was then resected with an patella reamer and saw after careful measurement with a caliper.  The patella was then prepared with the drill guide and a trial patella was placed.  The knee was then taken through range of motion and it was found that the patella articulated appropriately with the trochlea and good patellofemoral motion without subluxation.    The correct final components for implantation were confirmed and opened by the circulator nurse.  The prepared surfaces of the patella femur and tibia were cleaned with pulsatile lavage to remove all blood fat and other material and then the surfaces were dried.  2 bags of cement were mixed under vacuum and the components were cemented into place.  Excess cement was removed with  curettes and forceps. A trial polyethylene tibial component was placed and the knee was brought into extension to allow the cement to set.  At this time the periarticular injection cocktail was placed in the soft tissues surrounding the knee.  After full curing of the cement the balance of the knee was checked again and the final polyethylene size was confirmed. The tibial component was irrigated and locking mechanism checked to ensure it was clear of debris. The real polyethylene tibial component was implanted and the knee was brought through a range of motion.   The knee  was then irrigated with copious amount of normal saline via pulsatile lavage to remove all loose bodies and other debris.  The knee was then irrigated with surgiphor betadine based wash and reirrigated with saline.  The tourniquet was then dropped and all bleeding vessels were identified and coagulated.  The arthrotomy was approximated with #1 Vicryl and closed with #2 Quill suture.  The knee was brought into slight flexion and the subcutaneous tissues were closed with 0 Vicryl, 2-0 Vicryl and a running subcuticular 4-0 stratafix barbed suture.  Skin was then glued with Dermabond.  A sterile adhesive dressing was then placed along with a sequential compression device to the calf, a Ted stocking, and a cryotherapy cuff.   Sponge, needle, and Lap counts were all correct at the end of the case.   The patient was transferred off of the operating room table to a hospital bed, good pulses were found distally on the operative side.  The patient was transferred to the recovery room in stable condition.

## 2023-03-11 NOTE — Anesthesia Preprocedure Evaluation (Signed)
Anesthesia Evaluation  Patient identified by MRN, date of birth, ID band Patient awake    Reviewed: Allergy & Precautions, H&P , NPO status , Patient's Chart, lab work & pertinent test results  History of Anesthesia Complications Negative for: history of anesthetic complications  Airway Mallampati: III  TM Distance: >3 FB Neck ROM: full    Dental  (+) Chipped, Dental Advidsory Given   Pulmonary neg pulmonary ROS, neg COPD, former smoker   Pulmonary exam normal breath sounds clear to auscultation       Cardiovascular hypertension, Pt. on medications (-) angina (-) Past MI, (-) Cardiac Stents and (-) CABG negative cardio ROS Normal cardiovascular exam(-) dysrhythmias  Rhythm:Regular Rate:Normal     Neuro/Psych  PSYCHIATRIC DISORDERS Anxiety Depression    negative neurological ROS     GI/Hepatic Neg liver ROS,GERD  Medicated and Controlled,,  Endo/Other  negative endocrine ROS    Renal/GU      Musculoskeletal   Abdominal   Peds  Hematology negative hematology ROS (+)   Anesthesia Other Findings Past Medical History: No date: Allergy No date: Anxiety     Comment:  controlled No date: Arthritis No date: Breast implant rupture No date: Cervical radiculopathy No date: Chicken pox No date: Colon polyps No date: Depression No date: Fibromyalgia No date: GERD (gastroesophageal reflux disease) No date: History of gastric surgery No date: History of hypothyroidism No date: Hyperlipidemia No date: Hypertension No date: Hypokalemia No date: Insomnia No date: Iron deficiency anemia No date: Multiple gastric ulcers No date: Pre-diabetes Since 1995: Rheumatoid arthritis (HCC)     Comment:  currently in a flare up  Past Surgical History: 1974: ABDOMINAL HYSTERECTOMY 1970's: AUGMENTATION MAMMAPLASTY; Bilateral     Comment:  silicone 07/04/2018: COLONOSCOPY WITH PROPOFOL; N/A     Comment:  Procedure: COLONOSCOPY  WITH PROPOFOL;  Surgeon: Scot Jun, MD;  Location: [facility] ENDOSCOPY;  Service:               Endoscopy;  Laterality: N/A; 09/22/2022: COLONOSCOPY WITH PROPOFOL; N/A     Comment:  Procedure: COLONOSCOPY WITH PROPOFOL;  Surgeon:               Regis Bill, MD;  Location: [facility] ENDOSCOPY;                Service: Endoscopy;  Laterality: N/A; 09/22/2022: ESOPHAGOGASTRODUODENOSCOPY (EGD) WITH PROPOFOL; N/A     Comment:  Procedure: ESOPHAGOGASTRODUODENOSCOPY (EGD) WITH               PROPOFOL;  Surgeon: Regis Bill, MD;  Location:               [facility] ENDOSCOPY;  Service: Endoscopy;  Laterality: N/A; No date: EYE SURGERY 11/22/10: FOOT SURGERY; Left 1970's: MASTECTOMY; Bilateral     Comment:  SUBCUTANEOUS MASTECTOMY - NO CANCER 12/2001: SMALL INTESTINE SURGERY 12/2001: STOMACH SURGERY 1966: TONSILLECTOMY AND ADENOIDECTOMY No date: UPPER GASTROINTESTINAL ENDOSCOPY  BMI    Body Mass Index: 29.52 kg/m      Reproductive/Obstetrics negative OB ROS                             Anesthesia Physical Anesthesia Plan  ASA: 2  Anesthesia Plan: Spinal   Post-op Pain Management:    Induction:   PONV Risk Score and Plan: 2 and Ondansetron, Dexamethasone, Propofol infusion, TIVA and Midazolam  Airway  Management Planned: Natural Airway and Nasal Cannula  Additional Equipment:   Intra-op Plan:   Post-operative Plan:   Informed Consent: I have reviewed the patients History and Physical, chart, labs and discussed the procedure including the risks, benefits and alternatives for the proposed anesthesia with the patient or authorized representative who has indicated his/her understanding and acceptance.     Dental Advisory Given  Plan Discussed with: Anesthesiologist, CRNA and Surgeon  Anesthesia Plan Comments: (Patient reports no bleeding problems and no anticoagulant use.  Plan for spinal with backup GA  Patient consented for risks of  anesthesia including but not limited to:  - adverse reactions to medications - damage to eyes, teeth, lips or other oral mucosa - nerve damage due to positioning  - risk of bleeding, infection and or nerve damage from spinal that could lead to paralysis - risk of headache or failed spinal - damage to teeth, lips or other oral mucosa - sore throat or hoarseness - damage to heart, brain, nerves, lungs, other parts of body or loss of life  Patient voiced understanding and assent.)       Anesthesia Quick Evaluation

## 2023-03-11 NOTE — Interval H&P Note (Signed)
Patient history and physical updated. Consent reviewed including risks, benefits, and alternatives to surgery. Patient agrees with above plan to proceed with right total knee arthroplasty  

## 2023-03-12 ENCOUNTER — Encounter: Payer: Self-pay | Admitting: Orthopedic Surgery

## 2023-03-12 DIAGNOSIS — M1711 Unilateral primary osteoarthritis, right knee: Secondary | ICD-10-CM | POA: Diagnosis not present

## 2023-03-12 DIAGNOSIS — I10 Essential (primary) hypertension: Secondary | ICD-10-CM | POA: Diagnosis not present

## 2023-03-12 DIAGNOSIS — E039 Hypothyroidism, unspecified: Secondary | ICD-10-CM | POA: Diagnosis not present

## 2023-03-12 DIAGNOSIS — Z79899 Other long term (current) drug therapy: Secondary | ICD-10-CM | POA: Diagnosis not present

## 2023-03-12 LAB — CBC
HCT: 37.2 % (ref 36.0–46.0)
Hemoglobin: 13.3 g/dL (ref 12.0–15.0)
MCH: 31.5 pg (ref 26.0–34.0)
MCHC: 35.8 g/dL (ref 30.0–36.0)
MCV: 88.2 fL (ref 80.0–100.0)
Platelets: 239 10*3/uL (ref 150–400)
RBC: 4.22 MIL/uL (ref 3.87–5.11)
RDW: 13.2 % (ref 11.5–15.5)
WBC: 14.5 10*3/uL — ABNORMAL HIGH (ref 4.0–10.5)
nRBC: 0 % (ref 0.0–0.2)

## 2023-03-12 LAB — BASIC METABOLIC PANEL
Anion gap: 11 (ref 5–15)
BUN: 15 mg/dL (ref 8–23)
CO2: 24 mmol/L (ref 22–32)
Calcium: 9.1 mg/dL (ref 8.9–10.3)
Chloride: 102 mmol/L (ref 98–111)
Creatinine, Ser: 0.73 mg/dL (ref 0.44–1.00)
GFR, Estimated: >60 mL/min (ref >60)
Glucose, Bld: 139 mg/dL — ABNORMAL HIGH (ref 70–99)
Potassium: 3.5 mmol/L (ref 3.5–5.1)
Sodium: 137 mmol/L (ref 135–145)

## 2023-03-12 MED ORDER — ACETAMINOPHEN 500 MG PO TABS
ORAL_TABLET | ORAL | Status: AC
  Filled 2023-03-12: qty 2

## 2023-03-12 MED ORDER — TRAMADOL HCL 50 MG PO TABS: 50.0000 mg | ORAL_TABLET | Freq: Four times a day (QID) | ORAL | 0 refills | Status: DC | PRN

## 2023-03-12 MED ORDER — LORATADINE 10 MG PO TABS
ORAL_TABLET | ORAL | Status: AC
  Filled 2023-03-12: qty 1

## 2023-03-12 MED ORDER — CEFAZOLIN SODIUM-DEXTROSE 2-4 GM/100ML-% IV SOLN
INTRAVENOUS | Status: AC
  Filled 2023-03-12: qty 100

## 2023-03-12 MED ORDER — ATORVASTATIN CALCIUM 20 MG PO TABS
ORAL_TABLET | ORAL | Status: AC
  Filled 2023-03-12: qty 4

## 2023-03-12 MED ORDER — ACETAMINOPHEN 500 MG PO TABS: 1000.0000 mg | ORAL_TABLET | Freq: Three times a day (TID) | ORAL | 0 refills | Status: AC

## 2023-03-12 MED ORDER — ENOXAPARIN SODIUM 30 MG/0.3ML IJ SOSY
PREFILLED_SYRINGE | INTRAMUSCULAR | Status: AC
  Filled 2023-03-12: qty 0.3

## 2023-03-12 MED ORDER — CARVEDILOL 12.5 MG PO TABS
ORAL_TABLET | ORAL | Status: AC
  Filled 2023-03-12: qty 1

## 2023-03-12 MED ORDER — DOCUSATE SODIUM 100 MG PO CAPS
ORAL_CAPSULE | ORAL | Status: AC
  Filled 2023-03-12: qty 1

## 2023-03-12 MED ORDER — PANTOPRAZOLE SODIUM 40 MG PO TBEC
DELAYED_RELEASE_TABLET | ORAL | Status: AC
  Filled 2023-03-12: qty 1

## 2023-03-12 MED ORDER — KETOROLAC TROMETHAMINE 15 MG/ML IJ SOLN
INTRAMUSCULAR | Status: AC
Start: 2023-03-12 — End: ?
  Filled 2023-03-12: qty 1

## 2023-03-12 MED ORDER — CELECOXIB 200 MG PO CAPS: 200.0000 mg | ORAL_CAPSULE | Freq: Two times a day (BID) | ORAL | 0 refills | Status: AC

## 2023-03-12 MED ORDER — ACETAMINOPHEN 500 MG PO TABS
ORAL_TABLET | ORAL | Status: AC
Start: 2023-03-12 — End: ?
  Filled 2023-03-12: qty 2

## 2023-03-12 MED ORDER — DOCUSATE SODIUM 100 MG PO CAPS: 100.0000 mg | ORAL_CAPSULE | Freq: Two times a day (BID) | ORAL | 0 refills | Status: DC

## 2023-03-12 MED ORDER — ONDANSETRON HCL 4 MG PO TABS: 4.0000 mg | ORAL_TABLET | Freq: Four times a day (QID) | ORAL | 0 refills | Status: DC | PRN

## 2023-03-12 MED ORDER — HYDROCODONE-ACETAMINOPHEN 5-325 MG PO TABS
ORAL_TABLET | ORAL | Status: AC
  Filled 2023-03-12: qty 2

## 2023-03-12 MED ORDER — ENOXAPARIN SODIUM 40 MG/0.4ML IJ SOSY: 40.0000 mg | PREFILLED_SYRINGE | INTRAMUSCULAR | 0 refills | Status: DC

## 2023-03-12 MED ORDER — HYDROCHLOROTHIAZIDE 25 MG PO TABS
ORAL_TABLET | ORAL | Status: AC
Start: 2023-03-12 — End: ?
  Filled 2023-03-12: qty 1

## 2023-03-12 MED ORDER — KETOROLAC TROMETHAMINE 15 MG/ML IJ SOLN
INTRAMUSCULAR | Status: AC
  Filled 2023-03-12: qty 1

## 2023-03-12 MED ORDER — ONDANSETRON 4 MG PO TBDP: 4.0000 mg | ORAL_TABLET | Freq: Four times a day (QID) | ORAL | 0 refills | Status: DC | PRN

## 2023-03-12 NOTE — Progress Notes (Signed)
Patient is not able to walk the distance required to go the bathroom, or she is unable to safely negotiate stairs required to access the bathroom.  A 3in1 BSC will alleviate this problem.       Lollie Marrow, PA-C [facility]

## 2023-03-12 NOTE — Anesthesia Postprocedure Evaluation (Signed)
Anesthesia Post Note  Patient: Tomisha M Miceli  Procedure(s) Performed: TOTAL KNEE ARTHROPLASTY (Right: Knee)  Patient location during evaluation: Nursing Unit Anesthesia Type: Spinal Level of consciousness: awake and alert and oriented Pain management: pain level controlled Vital Signs Assessment: post-procedure vital signs reviewed and stable Respiratory status: respiratory function stable Cardiovascular status: stable Postop Assessment: no headache, no backache, adequate PO intake, able to ambulate, patient able to bend at knees and no apparent nausea or vomiting Anesthetic complications: no   No notable events documented.   Last Vitals:  Vitals:   03/12/23 0023 03/12/23 0408  BP: 128/64 (!) 144/81  Pulse: 82 92  Resp: 16 16  Temp: (!) 36.3 C 36.4 C  SpO2: 94% 94%    Last Pain:  Vitals:   03/12/23 0408  TempSrc: Temporal  PainSc: 0-No pain                 Zachary George

## 2023-03-12 NOTE — Plan of Care (Signed)

## 2023-03-12 NOTE — Progress Notes (Signed)
Patient discharging home. IV removed. Instructions given to patient, verbalized understanding. Family transporting patient home.

## 2023-03-12 NOTE — Progress Notes (Signed)
   Subjective: 1 Day Post-Op Procedure(s) (LRB): TOTAL KNEE ARTHROPLASTY (Right) Patient reports pain as mild.   Patient is well, and has had no acute complaints or problems Denies any CP, SOB, ABD pain. We will continue therapy today.  Plan is to go Home after hospital stay.  Objective: Vital signs in last 24 hours: Temp:  [96.7 F (35.9 C)-97.9 F (36.6 C)] 97.5 F (36.4 C) (11/22 0811) Pulse Rate:  [66-95] 81 (11/22 0811) Resp:  [13-23] 16 (11/22 0408) BP: (86-162)/(50-97) 141/65 (11/22 0811) SpO2:  [93 %-100 %] 96 % (11/22 0811) Weight:  [78 kg] 78 kg (11/21 1145)  Intake/Output from previous day: 11/21 0701 - 11/22 0700 In: 1480 [P.O.:180; I.V.:900; IV Piggyback:400] Out: 200 [Urine:150; Blood:50] Intake/Output this shift: No intake/output data recorded.  Recent Labs    03/12/23 0628  HGB 13.3   Recent Labs    03/12/23 0628  WBC 14.5*  RBC 4.22  HCT 37.2  PLT 239   Recent Labs    03/12/23 0628  NA 137  K 3.5  CL 102  CO2 24  BUN 15  CREATININE 0.73  GLUCOSE 139*  CALCIUM 9.1   No results for input(s): "LABPT", "INR" in the last 72 hours.  EXAM General - Patient is Alert, Appropriate, and Oriented Extremity - Neurovascular intact Sensation intact distally Intact pulses distally Dressing - dressing C/D/I and no drainage Motor Function - intact, moving foot and toes well on exam.   Past Medical History:  Diagnosis Date   Allergy    Anxiety    controlled   Arthritis    Breast implant rupture    Cervical radiculopathy    Chicken pox    Colon polyps    Depression    Fibromyalgia    GERD (gastroesophageal reflux disease)    History of gastric surgery    History of hypothyroidism    Hyperlipidemia    Hypertension    Hypokalemia    Insomnia    Iron deficiency anemia    Multiple gastric ulcers    Pre-diabetes    Rheumatoid arthritis (HCC) Since 1995   currently in a flare up    Assessment/Plan:   1 Day Post-Op Procedure(s)  (LRB): TOTAL KNEE ARTHROPLASTY (Right) Principal Problem:   S/P TKR (total knee replacement) using cement, right  Estimated body mass index is 29.52 kg/m as calculated from the following:   Height as of this encounter: 5\' 4"  (1.626 m).   Weight as of this encounter: 78 kg. Advance diet Up with therapy Pain well-controlled Labs and vital signs are stable Care management to assist with discharge to home with home health PT today pending safe completion of PT goals.  DVT Prophylaxis - Lovenox, TED hose, and SCDS Weight-Bearing as tolerated to right leg   T. Cranston Neighbor, PA-C [facility] 03/12/2023, 8:44 AM

## 2023-03-12 NOTE — Evaluation (Signed)
Physical Therapy Evaluation Patient Details Name: Audrey Hamilton MRN: [id number] DOB: May 13, 1945 Today's Date: 03/12/2023  History of Present Illness  Audrey Hamilton is an 77 y.o. female who was admitted 03/11/2023 with a diagnosis of S/P TKR (total knee replacement) using cement, right and went to the operating room on 03/11/2023  Clinical Impression  Pt up in chair on arrival, pain at goal. Pt reports AMB to BR with staff multiple times with success. Pt demonstrates transfers and AMB with RW, safely, no frank LOB, adjusts well to pain limitations. Pt AMB a steady pace at 141ft with RW, not fast enough for easy AMB outside of household distances. Pt assisted with HEP education with handout, is mildly limited by difficulty with quads activation and postsurgical stiffness more than pain as a limitation. Pt is shown self technique for HEP assist with bed sheet. Education on knee precautions and use of polar care. Pt educated on how OT eval would be helpful for daily occupations. No additional PT needs at this time. Pt politely declines a 2nd PT session later day, she endorses confidence in performing her current needs.       If plan is discharge home, recommend the following: A lot of help with walking and/or transfers   Can travel by private vehicle        Equipment Recommendations Rolling walker (2 wheels)  Recommendations for Other Services       Functional Status Assessment Patient has had a recent decline in their functional status and demonstrates the ability to make significant improvements in function in a reasonable and predictable amount of time.     Precautions / Restrictions Precautions Precautions: Fall;Knee Restrictions Weight Bearing Restrictions: Yes RLE Weight Bearing: Weight bearing as tolerated      Mobility  Bed Mobility                    Transfers Overall transfer level: Needs assistance Equipment used: None Transfers: Sit to/from  Stand Sit to Stand: Supervision                Ambulation/Gait Ambulation/Gait assistance: Contact guard assist Gait Distance (Feet): 120 Feet Assistive device: Rolling walker (2 wheels) Gait Pattern/deviations: Step-to pattern, Step-through pattern       General Gait Details: achieves continuous RW push, step through with 80% symetry of step length. Consistent pacing wth sfe RW use  Stairs            Wheelchair Mobility     Tilt Bed    Modified Rankin (Stroke Patients Only)       Balance                                             Pertinent Vitals/Pain Pain Assessment Pain Assessment: No/denies pain    Home Living Family/patient expects to be discharged to:: Private residence Living Arrangements: Alone Available Help at Discharge: Family;Available 24 hours/day (Daughter will be staying with her for several days after discharge) Type of Home: Apartment Home Access: Level entry       Home Layout: One level Home Equipment: Agricultural consultant (2 wheels);Rollator (4 wheels);Cane - single point      Prior Function Prior Level of Function : Independent/Modified Independent;Driving                     Extremity/Trunk Assessment   Upper  Extremity Assessment Upper Extremity Assessment: Overall WFL for tasks assessed    Lower Extremity Assessment Lower Extremity Assessment: Defer to PT evaluation       Communication   Communication Communication: No apparent difficulties  Cognition                                                General Comments      Exercises Total Joint Exercises Ankle Circles/Pumps: AROM Short Arc Quad: AROM, Right, 5 reps Heel Slides: AROM, AAROM, Right, Seated, 10 reps Long Arc Quad: AROM Knee Flexion: AROM Goniometric ROM: ~20->70 degrees   Assessment/Plan    PT Assessment Patient needs continued PT services  PT Problem List Decreased strength;Decreased range of  motion;Decreased activity tolerance;Decreased balance;Decreased mobility;Decreased coordination       PT Treatment Interventions DME instruction;Gait training;Stair training;Functional mobility training;Therapeutic activities;Therapeutic exercise;Balance training;Neuromuscular re-education;Cognitive remediation    PT Goals (Current goals can be found in the Care Plan section)  Acute Rehab PT Goals PT Goal Formulation: All assessment and education complete, DC therapy    Frequency BID     Co-evaluation               AM-PAC PT "6 Clicks" Mobility  Outcome Measure Help needed turning from your back to your side while in a flat bed without using bedrails?: A Little Help needed moving from lying on your back to sitting on the side of a flat bed without using bedrails?: A Little Help needed moving to and from a bed to a chair (including a wheelchair)?: A Little Help needed standing up from a chair using your arms (e.g., wheelchair or bedside chair)?: A Little Help needed to walk in hospital room?: A Little Help needed climbing 3-5 steps with a railing? : A Little 6 Click Score: 18    End of Session Equipment Utilized During Treatment: Gait belt Activity Tolerance: Patient tolerated treatment well;No increased pain Patient left: in chair;with call bell/phone within reach   PT Visit Diagnosis: Unsteadiness on feet (R26.81);Other abnormalities of gait and mobility (R26.89)    Time: 6213-0865 PT Time Calculation (min) (ACUTE ONLY): 29 min   Charges:   PT Evaluation $PT Eval Moderate Complexity: 1 Mod PT Treatments $Gait Training: 8-22 mins PT General Charges $$ ACUTE PT VISIT: 1 Visit       3:06 PM, 03/12/23 Rosamaria Lints, PT, DPT Physical Therapist - [facility]  [phone] (ASCOM)    Karl Erway C 03/12/2023, 3:01 PM

## 2023-03-12 NOTE — Discharge Summary (Signed)
Physician Discharge Summary  Patient ID: Audrey Hamilton MRN: [id number] DOB/AGE: 1945/05/13 77 y.o.  Admit date: 03/11/2023 Discharge date: 03/12/2023  Admission Diagnoses:  S/P TKR (total knee replacement) using cement, right [Z96.651]   Discharge Diagnoses: Patient Active Problem List   Diagnosis Date Noted   S/P TKR (total knee replacement) using cement, right 03/11/2023   Preoperative examination 12/30/2022   Chills 11/25/2022   GERD (gastroesophageal reflux disease) 08/21/2022   History of gastric surgery 06/25/2022   Osteoarthritis of right knee 05/20/2022   Fatigue 02/17/2022   Grief 01/07/2021   Obesity (BMI 30.0-34.9) 09/05/2019   Skin lesion 07/31/2019   Lightheadedness 05/24/2019   Breast implant rupture 10/31/2018   Prediabetes 04/21/2018   Essential hypertension 02/21/2018   Colon cancer screening 02/21/2018   Allergic rhinitis 07/05/2017   Cervical radiculopathy 04/05/2017   History of hypothyroidism 07/01/2015   Insomnia 07/01/2015   Rheumatoid arthritis (HCC) 07/01/2015   Iron deficiency anemia 07/01/2015   HLD (hyperlipidemia) 06/12/2015   Fibromyalgia 06/12/2015    Past Medical History:  Diagnosis Date   Allergy    Anxiety    controlled   Arthritis    Breast implant rupture    Cervical radiculopathy    Chicken pox    Colon polyps    Depression    Fibromyalgia    GERD (gastroesophageal reflux disease)    History of gastric surgery    History of hypothyroidism    Hyperlipidemia    Hypertension    Hypokalemia    Insomnia    Iron deficiency anemia    Multiple gastric ulcers    Pre-diabetes    Rheumatoid arthritis (HCC) Since 1995   currently in a flare up     Transfusion: nne   Consultants (if any):   Discharged Condition: Improved  Hospital Course: Audrey Hamilton is an 77 y.o. female who was admitted 03/11/2023 with a diagnosis of S/P TKR (total knee replacement) using cement, right and went to the operating room on  03/11/2023 and underwent the above named procedures.    Surgeries: Procedure(s): TOTAL KNEE ARTHROPLASTY on 03/11/2023 Patient tolerated the surgery well. Taken to PACU where she was stabilized and then transferred to the orthopedic floor.  Started on Lovenox 30 mg q 12 hrs. TEDs and SCDs applied bilaterally. Heels elevated on bed. No evidence of DVT. Negative Homan. Physical therapy started on day #1 for gait training and transfer. OT started day #1 for ADL and assisted devices.  Patient's IV was d/c on day #1. Patient was able to safely and independently complete all PT goals. PT recommending discharge to home.    On post op day #1 patient was stable and ready for discharge to home with HHPT.  Implants: Femur: Persona Size 7 CR narrow   Tibia: Persona Size E w/ 14x47mm stem extension  Poly: 10mm MC  Patella: 32x8.5 symmetric   She was given perioperative antibiotics:  Anti-infectives (From admission, onward)    Start     Dose/Rate Route Frequency Ordered Stop   03/11/23 2200  hydroxychloroquine (PLAQUENIL) tablet 200 mg        200 mg Oral 2 times daily 03/11/23 1752     03/11/23 2000  ceFAZolin (ANCEF) IVPB 2g/100 mL premix        2 g 200 mL/hr over 30 Minutes Intravenous Every 6 hours 03/11/23 1752 03/12/23 0346   03/11/23 0600  ceFAZolin (ANCEF) IVPB 2g/100 mL premix        2 g 200 mL/hr  over 30 Minutes Intravenous On call to O.R. 03/10/23 2159 03/11/23 1405     .  She was given sequential compression devices, early ambulation, and Lovenox TEDs for DVT prophylaxis.  She benefited maximally from the hospital stay and there were no complications.    Recent vital signs:  Vitals:   03/12/23 0408 03/12/23 0811  BP: (!) 144/81 (!) 141/65  Pulse: 92 81  Resp: 16   Temp: 97.6 F (36.4 C) (!) 97.5 F (36.4 C)  SpO2: 94% 96%    Recent laboratory studies:  Lab Results  Component Value Date   HGB 13.3 03/12/2023   HGB 14.8 03/01/2023   HGB 15.7 (H) 10/26/2022   Lab  Results  Component Value Date   WBC 14.5 (H) 03/12/2023   PLT 239 03/12/2023   No results found for: "INR" Lab Results  Component Value Date   NA 137 03/12/2023   K 3.5 03/12/2023   CL 102 03/12/2023   CO2 24 03/12/2023   BUN 15 03/12/2023   CREATININE 0.73 03/12/2023   GLUCOSE 139 (H) 03/12/2023    Discharge Medications:   Allergies as of 03/12/2023       Reactions   Amoxicillin-pot Clavulanate    Aspirin    Aspirin cream only    Baclofen    Made pt feel dizzy    Fluconazole    Oxaprozin         Medication List     STOP taking these medications    clonazePAM 1 MG tablet Commonly known as: KLONOPIN   meloxicam 7.5 MG tablet Commonly known as: MOBIC   montelukast 10 MG tablet Commonly known as: SINGULAIR   polyethylene glycol-electrolytes 420 g solution Commonly known as: NuLYTELY   potassium chloride SA 20 MEQ tablet Commonly known as: KLOR-CON M   QC TUMERIC COMPLEX PO       TAKE these medications    acetaminophen 500 MG tablet Commonly known as: TYLENOL Take 2 tablets (1,000 mg total) by mouth every 8 (eight) hours.   atorvastatin 80 MG tablet Commonly known as: LIPITOR TAKE 1 TABLET (80 MG TOTAL) BY MOUTH DAILY FOR 180 DOSES.   carvedilol 6.25 MG tablet Commonly known as: COREG TAKE 1 TABLET BY MOUTH 2 TIMES DAILY WITH A MEAL.   celecoxib 200 MG capsule Commonly known as: CeleBREX Take 1 capsule (200 mg total) by mouth 2 (two) times daily for 10 days.   Centrum Silver Adult 50+ Tabs Take 1 tablet by mouth daily.   cholecalciferol 25 MCG (1000 UNIT) tablet Commonly known as: VITAMIN D3 Take 1,000 Units by mouth daily.   cyclobenzaprine 5 MG tablet Commonly known as: FLEXERIL Take 5 mg by mouth at bedtime as needed for muscle spasms.   docusate sodium 100 MG capsule Commonly known as: COLACE Take 1 capsule (100 mg total) by mouth 2 (two) times daily.   enoxaparin 40 MG/0.4ML injection Commonly known as: LOVENOX Inject 0.4  mLs (40 mg total) into the skin daily for 14 days.   escitalopram 10 MG tablet Commonly known as: LEXAPRO Take 10 mg by mouth daily.   fluticasone 50 MCG/ACT nasal spray Commonly known as: FLONASE Place 1 spray into both nostrils daily.   hydrochlorothiazide 25 MG tablet Commonly known as: HYDRODIURIL Take 1 tablet (25 mg total) by mouth daily.   hydroxychloroquine 200 MG tablet Commonly known as: PLAQUENIL Take 200 mg by mouth 2 (two) times daily.   loratadine 10 MG tablet Commonly known as: CLARITIN TAKE  1 TABLET BY MOUTH EVERY DAY   LORazepam 1 MG tablet Commonly known as: ATIVAN Take 1 mg by mouth at bedtime.   omega-3 acid ethyl esters 1 g capsule Commonly known as: LOVAZA TAKE 2 CAPSULES BY MOUTH TWICE A DAY   ondansetron 4 MG tablet Commonly known as: ZOFRAN Take 1 tablet (4 mg total) by mouth every 6 (six) hours as needed for nausea.   pantoprazole 40 MG tablet Commonly known as: PROTONIX TAKE 1 TABLET BY MOUTH 2 TIMES DAILY BEFORE A MEAL FOR 14 DAYS, THEN 1 TABLET (40 MG TOTAL) DAILY BEFORE BREAKFAST.   traMADol 50 MG tablet Commonly known as: ULTRAM Take 1 tablet (50 mg total) by mouth every 6 (six) hours as needed for moderate pain (pain score 4-6).               Durable Medical Equipment  (From admission, onward)           Start     Ordered   03/12/23 0849  For home use only DME 3 n 1  Once        03/12/23 0848   03/12/23 0848  For home use only DME Walker  Once       Question:  Patient needs a walker to treat with the following condition  Answer:  Total knee replacement status   03/12/23 0848            Diagnostic Studies: DG Knee 1-2 Views Right  Result Date: 03/11/2023 CLINICAL DATA:  Primary osteoarthritis of right knee. Elective knee surgery. EXAM: RIGHT KNEE - 1-2 VIEW COMPARISON:  None Available. FINDINGS: Interval total right knee arthroplasty. No perihardware lucency is seen to indicate hardware failure or loosening.  Expected postoperative changes including intra-articular and subcutaneous air. Smalljoint effusion. No acute fracture or dislocation. IMPRESSION: Interval total right knee arthroplasty without evidence of hardware failure. Electronically Signed   By: Neita Garnet M.D.   On: 03/11/2023 17:07    Disposition:      Follow-up Information     Evon Slack, PA-C Follow up in 2 week(s).   Specialties: Orthopedic Surgery, Emergency Medicine Contact information: [address] [place] [address] [phone]                  Signed: Patience Musca 03/12/2023, 8:52 AM

## 2023-03-12 NOTE — Evaluation (Signed)
Occupational Therapy Evaluation Patient Details Name: Audrey Hamilton MRN: [id number] DOB: December 16, 1945 Today's Date: 03/12/2023   History of Present Illness Audrey Hamilton is an 77 y.o. female who was admitted 03/11/2023 with a diagnosis of S/P TKR (total knee replacement) using cement, right and went to the operating room on 03/11/2023   Clinical Impression    Pt is agreeable to OT evaluation this session. She reports living at home alone in an apartment independently prior to admission. She endorses her daughter will be coming to stay with her for several  days after hospital discharge. OT reviewed use of polar care and techniques to increase Ind with LB self care as well as to decrease fall risk. Pt demonstrates LB dressing with supervision for min cuing for technique to don UB and LB clothing items. 3 in 1 commode chair dropped off as therapist arrived to room. Pt has all needed equipment and education completed. OT to sign off at this time.     If plan is discharge home, recommend the following: A little help with walking and/or transfers;A little help with bathing/dressing/bathroom;Assistance with cooking/housework;Assist for transportation;Help with stairs or ramp for entrance    Functional Status Assessment  Patient has not had a recent decline in their functional status  Equipment Recommendations  BSC/3in1       Precautions / Restrictions Precautions Precautions: Fall Restrictions Weight Bearing Restrictions: Yes RLE Weight Bearing: Weight bearing as tolerated      Mobility Bed Mobility Overal bed mobility: Modified Independent             General bed mobility comments: increased time with HOB elevated but no physical assistance provided    Transfers Overall transfer level: Needs assistance Equipment used: Rolling walker (2 wheels) Transfers: Sit to/from Stand Sit to Stand: Supervision                  Balance Overall balance assessment:  Modified Independent                                         ADL either performed or assessed with clinical judgement   ADL Overall ADL's : Needs assistance/impaired                                       General ADL Comments: supervision overall with cuing for technique in order to increase Ind with LB dressing     Vision Baseline Vision/History: 1 Wears glasses Patient Visual Report: No change from baseline              Pertinent Vitals/Pain Pain Assessment Pain Assessment: No/denies pain     Extremity/Trunk Assessment Upper Extremity Assessment Upper Extremity Assessment: Overall WFL for tasks assessed   Lower Extremity Assessment Lower Extremity Assessment: Defer to PT evaluation       Communication Communication Communication: No apparent difficulties   Cognition Arousal: Alert Behavior During Therapy: WFL for tasks assessed/performed Overall Cognitive Status: Within Functional Limits for tasks assessed                                 General Comments: pleasant and cooperative                Home Living Family/patient expects  to be discharged to:: Private residence Living Arrangements: Alone Available Help at Discharge: Family;Available 24 hours/day (Daughter will be staying with her for several days after discharge) Type of Home: Apartment Home Access: Level entry     Home Layout: One level     Bathroom Shower/Tub: Tub/shower unit         Home Equipment: Agricultural consultant (2 wheels);Rollator (4 wheels);Cane - single point          Prior Functioning/Environment Prior Level of Function : Independent/Modified Independent;Driving                                 OT Goals(Current goals can be found in the care plan section) Acute Rehab OT Goals Patient Stated Goal: to go home OT Goal Formulation: With patient Time For Goal Achievement: 03/12/23 Potential to Achieve Goals: Fair  OT  Frequency:         AM-PAC OT "6 Clicks" Daily Activity     Outcome Measure Help from another person eating meals?: None Help from another person taking care of personal grooming?: None Help from another person toileting, which includes using toliet, bedpan, or urinal?: None Help from another person bathing (including washing, rinsing, drying)?: A Little Help from another person to put on and taking off regular upper body clothing?: None Help from another person to put on and taking off regular lower body clothing?: A Little 6 Click Score: 22   End of Session Equipment Utilized During Treatment: Rolling walker (2 wheels) Nurse Communication: Mobility status  Activity Tolerance: Patient tolerated treatment well Patient left: in bed                   Time: 1110-1127 OT Time Calculation (min): 17 min Charges:  OT General Charges $OT Visit: 1 Visit OT Evaluation $OT Eval Low Complexity: 1 Low OT Treatments $Self Care/Home Management : 8-22 mins  Jackquline Denmark, MS, OTR/L , CBIS ascom [phone]  03/12/23, 12:14 PM

## 2023-03-13 DIAGNOSIS — Z791 Long term (current) use of non-steroidal anti-inflammatories (NSAID): Secondary | ICD-10-CM | POA: Diagnosis not present

## 2023-03-13 DIAGNOSIS — Z471 Aftercare following joint replacement surgery: Secondary | ICD-10-CM | POA: Diagnosis not present

## 2023-03-13 DIAGNOSIS — F4321 Adjustment disorder with depressed mood: Secondary | ICD-10-CM | POA: Diagnosis not present

## 2023-03-13 DIAGNOSIS — F419 Anxiety disorder, unspecified: Secondary | ICD-10-CM | POA: Diagnosis not present

## 2023-03-13 DIAGNOSIS — E669 Obesity, unspecified: Secondary | ICD-10-CM | POA: Diagnosis not present

## 2023-03-13 DIAGNOSIS — E7800 Pure hypercholesterolemia, unspecified: Secondary | ICD-10-CM | POA: Diagnosis not present

## 2023-03-13 DIAGNOSIS — Z5982 Transportation insecurity: Secondary | ICD-10-CM | POA: Diagnosis not present

## 2023-03-13 DIAGNOSIS — M810 Age-related osteoporosis without current pathological fracture: Secondary | ICD-10-CM | POA: Diagnosis not present

## 2023-03-13 DIAGNOSIS — E039 Hypothyroidism, unspecified: Secondary | ICD-10-CM | POA: Diagnosis not present

## 2023-03-13 DIAGNOSIS — I10 Essential (primary) hypertension: Secondary | ICD-10-CM | POA: Diagnosis not present

## 2023-03-13 DIAGNOSIS — D509 Iron deficiency anemia, unspecified: Secondary | ICD-10-CM | POA: Diagnosis not present

## 2023-03-13 DIAGNOSIS — E2839 Other primary ovarian failure: Secondary | ICD-10-CM | POA: Diagnosis not present

## 2023-03-13 DIAGNOSIS — Z6829 Body mass index (BMI) 29.0-29.9, adult: Secondary | ICD-10-CM | POA: Diagnosis not present

## 2023-03-13 DIAGNOSIS — Z96651 Presence of right artificial knee joint: Secondary | ICD-10-CM | POA: Diagnosis not present

## 2023-03-13 DIAGNOSIS — M069 Rheumatoid arthritis, unspecified: Secondary | ICD-10-CM | POA: Diagnosis not present

## 2023-03-13 DIAGNOSIS — F32A Depression, unspecified: Secondary | ICD-10-CM | POA: Diagnosis not present

## 2023-03-16 DIAGNOSIS — M069 Rheumatoid arthritis, unspecified: Secondary | ICD-10-CM | POA: Diagnosis not present

## 2023-03-16 DIAGNOSIS — Z96651 Presence of right artificial knee joint: Secondary | ICD-10-CM | POA: Diagnosis not present

## 2023-03-16 DIAGNOSIS — E7800 Pure hypercholesterolemia, unspecified: Secondary | ICD-10-CM | POA: Diagnosis not present

## 2023-03-16 DIAGNOSIS — Z471 Aftercare following joint replacement surgery: Secondary | ICD-10-CM | POA: Diagnosis not present

## 2023-03-16 DIAGNOSIS — I10 Essential (primary) hypertension: Secondary | ICD-10-CM | POA: Diagnosis not present

## 2023-03-16 DIAGNOSIS — D509 Iron deficiency anemia, unspecified: Secondary | ICD-10-CM | POA: Diagnosis not present

## 2023-03-17 DIAGNOSIS — Z96651 Presence of right artificial knee joint: Secondary | ICD-10-CM | POA: Diagnosis not present

## 2023-03-17 DIAGNOSIS — Z471 Aftercare following joint replacement surgery: Secondary | ICD-10-CM | POA: Diagnosis not present

## 2023-03-17 DIAGNOSIS — E7800 Pure hypercholesterolemia, unspecified: Secondary | ICD-10-CM | POA: Diagnosis not present

## 2023-03-17 DIAGNOSIS — D509 Iron deficiency anemia, unspecified: Secondary | ICD-10-CM | POA: Diagnosis not present

## 2023-03-17 DIAGNOSIS — M069 Rheumatoid arthritis, unspecified: Secondary | ICD-10-CM | POA: Diagnosis not present

## 2023-03-17 DIAGNOSIS — I10 Essential (primary) hypertension: Secondary | ICD-10-CM | POA: Diagnosis not present

## 2023-03-19 DIAGNOSIS — I10 Essential (primary) hypertension: Secondary | ICD-10-CM | POA: Diagnosis not present

## 2023-03-19 DIAGNOSIS — Z471 Aftercare following joint replacement surgery: Secondary | ICD-10-CM | POA: Diagnosis not present

## 2023-03-19 DIAGNOSIS — D509 Iron deficiency anemia, unspecified: Secondary | ICD-10-CM | POA: Diagnosis not present

## 2023-03-19 DIAGNOSIS — E7800 Pure hypercholesterolemia, unspecified: Secondary | ICD-10-CM | POA: Diagnosis not present

## 2023-03-19 DIAGNOSIS — Z96651 Presence of right artificial knee joint: Secondary | ICD-10-CM | POA: Diagnosis not present

## 2023-03-19 DIAGNOSIS — M069 Rheumatoid arthritis, unspecified: Secondary | ICD-10-CM | POA: Diagnosis not present

## 2023-03-22 DIAGNOSIS — M069 Rheumatoid arthritis, unspecified: Secondary | ICD-10-CM | POA: Diagnosis not present

## 2023-03-22 DIAGNOSIS — I10 Essential (primary) hypertension: Secondary | ICD-10-CM | POA: Diagnosis not present

## 2023-03-22 DIAGNOSIS — Z96651 Presence of right artificial knee joint: Secondary | ICD-10-CM | POA: Diagnosis not present

## 2023-03-22 DIAGNOSIS — E7800 Pure hypercholesterolemia, unspecified: Secondary | ICD-10-CM | POA: Diagnosis not present

## 2023-03-22 DIAGNOSIS — D509 Iron deficiency anemia, unspecified: Secondary | ICD-10-CM | POA: Diagnosis not present

## 2023-03-22 DIAGNOSIS — Z471 Aftercare following joint replacement surgery: Secondary | ICD-10-CM | POA: Diagnosis not present

## 2023-03-24 DIAGNOSIS — Z471 Aftercare following joint replacement surgery: Secondary | ICD-10-CM | POA: Diagnosis not present

## 2023-03-24 DIAGNOSIS — I10 Essential (primary) hypertension: Secondary | ICD-10-CM | POA: Diagnosis not present

## 2023-03-24 DIAGNOSIS — E7800 Pure hypercholesterolemia, unspecified: Secondary | ICD-10-CM | POA: Diagnosis not present

## 2023-03-24 DIAGNOSIS — D509 Iron deficiency anemia, unspecified: Secondary | ICD-10-CM | POA: Diagnosis not present

## 2023-03-24 DIAGNOSIS — M069 Rheumatoid arthritis, unspecified: Secondary | ICD-10-CM | POA: Diagnosis not present

## 2023-03-24 DIAGNOSIS — Z96651 Presence of right artificial knee joint: Secondary | ICD-10-CM | POA: Diagnosis not present

## 2023-03-25 ENCOUNTER — Other Ambulatory Visit: Payer: Self-pay | Admitting: Family Medicine

## 2023-03-25 DIAGNOSIS — I10 Essential (primary) hypertension: Secondary | ICD-10-CM

## 2023-03-26 DIAGNOSIS — Z96651 Presence of right artificial knee joint: Secondary | ICD-10-CM | POA: Diagnosis not present

## 2023-03-26 DIAGNOSIS — E7800 Pure hypercholesterolemia, unspecified: Secondary | ICD-10-CM | POA: Diagnosis not present

## 2023-03-26 DIAGNOSIS — M069 Rheumatoid arthritis, unspecified: Secondary | ICD-10-CM | POA: Diagnosis not present

## 2023-03-26 DIAGNOSIS — Z471 Aftercare following joint replacement surgery: Secondary | ICD-10-CM | POA: Diagnosis not present

## 2023-03-26 DIAGNOSIS — D509 Iron deficiency anemia, unspecified: Secondary | ICD-10-CM | POA: Diagnosis not present

## 2023-03-26 DIAGNOSIS — I10 Essential (primary) hypertension: Secondary | ICD-10-CM | POA: Diagnosis not present

## 2023-03-29 ENCOUNTER — Ambulatory Visit: Payer: Medicare Other | Admitting: Internal Medicine

## 2023-03-29 ENCOUNTER — Other Ambulatory Visit: Payer: Medicare Other

## 2023-03-29 DIAGNOSIS — Z471 Aftercare following joint replacement surgery: Secondary | ICD-10-CM | POA: Diagnosis not present

## 2023-03-29 DIAGNOSIS — E7800 Pure hypercholesterolemia, unspecified: Secondary | ICD-10-CM | POA: Diagnosis not present

## 2023-03-29 DIAGNOSIS — Z96651 Presence of right artificial knee joint: Secondary | ICD-10-CM | POA: Diagnosis not present

## 2023-03-29 DIAGNOSIS — M069 Rheumatoid arthritis, unspecified: Secondary | ICD-10-CM | POA: Diagnosis not present

## 2023-03-29 DIAGNOSIS — D509 Iron deficiency anemia, unspecified: Secondary | ICD-10-CM | POA: Diagnosis not present

## 2023-03-29 DIAGNOSIS — I10 Essential (primary) hypertension: Secondary | ICD-10-CM | POA: Diagnosis not present

## 2023-03-31 ENCOUNTER — Encounter: Payer: Self-pay | Admitting: Family Medicine

## 2023-03-31 ENCOUNTER — Ambulatory Visit: Payer: Medicare Other | Admitting: Family Medicine

## 2023-03-31 VITALS — BP 124/76 | HR 75 | Temp 97.4°F | Ht 64.0 in | Wt 165.0 lb

## 2023-03-31 DIAGNOSIS — D509 Iron deficiency anemia, unspecified: Secondary | ICD-10-CM | POA: Diagnosis not present

## 2023-03-31 DIAGNOSIS — I10 Essential (primary) hypertension: Secondary | ICD-10-CM | POA: Diagnosis not present

## 2023-03-31 DIAGNOSIS — Z471 Aftercare following joint replacement surgery: Secondary | ICD-10-CM | POA: Diagnosis not present

## 2023-03-31 DIAGNOSIS — Z96651 Presence of right artificial knee joint: Secondary | ICD-10-CM

## 2023-03-31 DIAGNOSIS — E7800 Pure hypercholesterolemia, unspecified: Secondary | ICD-10-CM | POA: Diagnosis not present

## 2023-03-31 DIAGNOSIS — G4700 Insomnia, unspecified: Secondary | ICD-10-CM

## 2023-03-31 DIAGNOSIS — M810 Age-related osteoporosis without current pathological fracture: Secondary | ICD-10-CM | POA: Diagnosis not present

## 2023-03-31 DIAGNOSIS — M069 Rheumatoid arthritis, unspecified: Secondary | ICD-10-CM | POA: Diagnosis not present

## 2023-03-31 NOTE — Progress Notes (Signed)
[name], MD Phone: [phone]  Audrey Hamilton is a 77 y.o. female who presents today for f/u.  HYPERTENSION Disease Monitoring Home BP Monitoring 120s/<80 Chest pain- no    Dyspnea- no Medications Compliance-  taking coreg, telmisartan, HCTZ.  Edema- no BMET    Component Value Date/Time   NA 137 03/12/2023 0628   NA 141 05/12/2019 1440   K 3.5 03/12/2023 0628   CL 102 03/12/2023 0628   CO2 24 03/12/2023 0628   GLUCOSE 139 (H) 03/12/2023 0628   BUN 15 03/12/2023 0628   BUN 13 05/12/2019 1440   CREATININE 0.73 03/12/2023 0628   CALCIUM 9.1 03/12/2023 0628   GFRNONAA >60 03/12/2023 0628   GFRAA 117 05/12/2019 1440   Insomnia: Patient notes this has improved some.  She is no longer taking clonazepam.  She stopped it around the time of her surgery for her right knee.  She is taking melatonin which has been beneficial.  Right total knee replacement.  Patient know she is doing well.  She doing physical therapy at home.  She notes no significant pain.  Social History   Tobacco Use  Smoking Status Former   Current packs/day: 0.00   Types: Cigarettes   Quit date: 06/19/1995   Years since quitting: 27.8  Smokeless Tobacco Never    Current Outpatient Medications on File Prior to Visit  Medication Sig Dispense Refill   acetaminophen (TYLENOL) 500 MG tablet Take 2 tablets (1,000 mg total) by mouth every 8 (eight) hours. 30 tablet 0   aspirin EC 81 MG tablet Take by mouth.     carvedilol (COREG) 6.25 MG tablet TAKE 1 TABLET BY MOUTH 2 TIMES DAILY WITH A MEAL. 180 tablet 1   celecoxib (CELEBREX) 200 MG capsule Take by mouth.     cholecalciferol (VITAMIN D3) 25 MCG (1000 UNIT) tablet Take 1,000 Units by mouth daily.     cyclobenzaprine (FLEXERIL) 5 MG tablet Take 5 mg by mouth at bedtime as needed for muscle spasms.     docusate sodium (COLACE) 100 MG capsule Take 1 capsule (100 mg total) by mouth 2 (two) times daily. 10 capsule 0   escitalopram (LEXAPRO) 10 MG tablet  Take 10 mg by mouth daily.     fluticasone (FLONASE) 50 MCG/ACT nasal spray Place 1 spray into both nostrils daily.     hydrochlorothiazide (HYDRODIURIL) 25 MG tablet TAKE 1 TABLET (25 MG TOTAL) BY MOUTH DAILY. 90 tablet 3   hydroxychloroquine (PLAQUENIL) 200 MG tablet Take 200 mg by mouth 2 (two) times daily.     loratadine (CLARITIN) 10 MG tablet TAKE 1 TABLET BY MOUTH EVERY DAY 90 tablet 3   LORazepam (ATIVAN) 1 MG tablet Take 1 mg by mouth at bedtime.     Multiple Vitamins-Minerals (CENTRUM SILVER ADULT 50+) TABS Take 1 tablet by mouth daily.     omega-3 acid ethyl esters (LOVAZA) 1 g capsule TAKE 2 CAPSULES BY MOUTH TWICE A DAY 360 capsule 0   ondansetron (ZOFRAN) 4 MG tablet Take 1 tablet (4 mg total) by mouth every 6 (six) hours as needed for nausea. 20 tablet 0   ondansetron (ZOFRAN-ODT) 4 MG disintegrating tablet Take 1 tablet (4 mg total) by mouth every 6 (six) hours as needed for nausea or vomiting. 30 tablet 0   pantoprazole (PROTONIX) 40 MG tablet TAKE 1 TABLET BY MOUTH 2 TIMES DAILY BEFORE A MEAL FOR 14 DAYS, THEN 1 TABLET (40 MG TOTAL) DAILY BEFORE BREAKFAST. 180 tablet 1  traMADol (ULTRAM) 50 MG tablet Take 1 tablet (50 mg total) by mouth every 6 (six) hours as needed for moderate pain (pain score 4-6). 30 tablet 0   atorvastatin (LIPITOR) 80 MG tablet TAKE 1 TABLET (80 MG TOTAL) BY MOUTH DAILY FOR 180 DOSES. 90 tablet 3   No current facility-administered medications on file prior to visit.     ROS see history of present illness  Objective  Physical Exam Vitals:   03/31/23 1442  BP: 124/76  Pulse: 75  Temp: (!) 97.4 F (36.3 C)  SpO2: 97%    BP Readings from Last 3 Encounters:  03/31/23 124/76  03/12/23 (!) 141/65  03/01/23 (!) 146/94   Wt Readings from Last 3 Encounters:  03/31/23 165 lb (74.8 kg)  03/11/23 171 lb 15.3 oz (78 kg)  03/01/23 172 lb (78 kg)    Physical Exam Constitutional:      General: She is not in acute distress.    Appearance: She is  not diaphoretic.  Cardiovascular:     Rate and Rhythm: Normal rate and regular rhythm.     Heart sounds: Normal heart sounds.  Pulmonary:     Effort: Pulmonary effort is normal.     Breath sounds: Normal breath sounds.  Skin:    General: Skin is warm and dry.  Neurological:     Mental Status: She is alert.      Assessment/Plan: Please see individual problem list.  Essential hypertension Assessment & Plan: Chronic issue.  Adequately controlled.  Patient will continue carvedilol 6.25 mg twice daily, telmisartan 80 mg daily, and HCTZ 25 mg daily.   Insomnia, unspecified type Assessment & Plan: Chronic issue.  Has been improving.  She will monitor on melatonin.  Could consider trazodone if melatonin is not beneficial.   S/P TKR (total knee replacement) using cement, right Assessment & Plan: Patient will continue to see her orthopedist for this.   Age-related osteoporosis without current pathological fracture Assessment & Plan: Noted on prior imaging.  Discussed treatment options.  Patient does have a history of stomach ulcers.  Discussed bisphosphonate orally would likely not be the best option.  Patient notes her orthopedist did not want her on anything for osteoporosis leading into her surgery.  She plans to have her left knee replaced next year.  Her daughter notes they will discuss with the surgeon in January what their thoughts are on treating her osteoporosis and she will let us know.  Advised risk of osteonecrosis of the jaw with medicines like Prolia and zoledronic acid.  Discussed the risk of aching with these medications as well.      Return in about 3 months (around 06/29/2023) for Transfer of care.   [name], MD [facility]

## 2023-03-31 NOTE — Assessment & Plan Note (Signed)
Chronic issue.  Has been improving.  She will monitor on melatonin.  Could consider trazodone if melatonin is not beneficial.

## 2023-03-31 NOTE — Assessment & Plan Note (Signed)
Patient will continue to see her orthopedist for this.

## 2023-03-31 NOTE — Assessment & Plan Note (Signed)
Chronic issue.  Adequately controlled.  Patient will continue carvedilol 6.25 mg twice daily, telmisartan 80 mg daily, and HCTZ 25 mg daily.

## 2023-03-31 NOTE — Assessment & Plan Note (Signed)
Noted on prior imaging.  Discussed treatment options.  Patient does have a history of stomach ulcers.  Discussed bisphosphonate orally would likely not be the best option.  Patient notes her orthopedist did not want her on anything for osteoporosis leading into her surgery.  She plans to have her left knee replaced next year.  Her daughter notes they will discuss with the surgeon in January what their thoughts are on treating her osteoporosis and she will let us know.  Advised risk of osteonecrosis of the jaw with medicines like Prolia and zoledronic acid.  Discussed the risk of aching with these medications as well.

## 2023-04-02 DIAGNOSIS — I10 Essential (primary) hypertension: Secondary | ICD-10-CM | POA: Diagnosis not present

## 2023-04-02 DIAGNOSIS — E7800 Pure hypercholesterolemia, unspecified: Secondary | ICD-10-CM | POA: Diagnosis not present

## 2023-04-02 DIAGNOSIS — Z96651 Presence of right artificial knee joint: Secondary | ICD-10-CM | POA: Diagnosis not present

## 2023-04-02 DIAGNOSIS — D509 Iron deficiency anemia, unspecified: Secondary | ICD-10-CM | POA: Diagnosis not present

## 2023-04-02 DIAGNOSIS — Z471 Aftercare following joint replacement surgery: Secondary | ICD-10-CM | POA: Diagnosis not present

## 2023-04-02 DIAGNOSIS — M069 Rheumatoid arthritis, unspecified: Secondary | ICD-10-CM | POA: Diagnosis not present

## 2023-04-12 DIAGNOSIS — M25561 Pain in right knee: Secondary | ICD-10-CM | POA: Diagnosis not present

## 2023-04-12 DIAGNOSIS — Z96651 Presence of right artificial knee joint: Secondary | ICD-10-CM | POA: Diagnosis not present

## 2023-04-23 DIAGNOSIS — Z96651 Presence of right artificial knee joint: Secondary | ICD-10-CM | POA: Diagnosis not present

## 2023-04-23 DIAGNOSIS — M25561 Pain in right knee: Secondary | ICD-10-CM | POA: Diagnosis not present

## 2023-04-29 DIAGNOSIS — Z96651 Presence of right artificial knee joint: Secondary | ICD-10-CM | POA: Diagnosis not present

## 2023-04-29 DIAGNOSIS — M25561 Pain in right knee: Secondary | ICD-10-CM | POA: Diagnosis not present

## 2023-05-03 DIAGNOSIS — Z96651 Presence of right artificial knee joint: Secondary | ICD-10-CM | POA: Diagnosis not present

## 2023-05-03 DIAGNOSIS — M25561 Pain in right knee: Secondary | ICD-10-CM | POA: Diagnosis not present

## 2023-05-04 ENCOUNTER — Ambulatory Visit: Payer: Medicare Other | Admitting: Internal Medicine

## 2023-05-04 ENCOUNTER — Other Ambulatory Visit: Payer: Medicare Other

## 2023-05-06 DIAGNOSIS — Z96651 Presence of right artificial knee joint: Secondary | ICD-10-CM | POA: Diagnosis not present

## 2023-05-06 DIAGNOSIS — M25561 Pain in right knee: Secondary | ICD-10-CM | POA: Diagnosis not present

## 2023-05-07 ENCOUNTER — Other Ambulatory Visit: Payer: Self-pay | Admitting: Family Medicine

## 2023-05-07 DIAGNOSIS — E782 Mixed hyperlipidemia: Secondary | ICD-10-CM

## 2023-05-09 ENCOUNTER — Other Ambulatory Visit: Payer: Self-pay | Admitting: Family Medicine

## 2023-05-09 DIAGNOSIS — I10 Essential (primary) hypertension: Secondary | ICD-10-CM

## 2023-05-11 DIAGNOSIS — Z96651 Presence of right artificial knee joint: Secondary | ICD-10-CM | POA: Diagnosis not present

## 2023-05-18 DIAGNOSIS — Z23 Encounter for immunization: Secondary | ICD-10-CM | POA: Diagnosis not present

## 2023-05-18 DIAGNOSIS — Z96651 Presence of right artificial knee joint: Secondary | ICD-10-CM | POA: Diagnosis not present

## 2023-05-18 DIAGNOSIS — M25561 Pain in right knee: Secondary | ICD-10-CM | POA: Diagnosis not present

## 2023-05-25 DIAGNOSIS — Z96651 Presence of right artificial knee joint: Secondary | ICD-10-CM | POA: Diagnosis not present

## 2023-05-25 DIAGNOSIS — M25561 Pain in right knee: Secondary | ICD-10-CM | POA: Diagnosis not present

## 2023-05-27 DIAGNOSIS — Z96651 Presence of right artificial knee joint: Secondary | ICD-10-CM | POA: Diagnosis not present

## 2023-05-27 DIAGNOSIS — M25561 Pain in right knee: Secondary | ICD-10-CM | POA: Diagnosis not present

## 2023-06-01 DIAGNOSIS — Z96651 Presence of right artificial knee joint: Secondary | ICD-10-CM | POA: Diagnosis not present

## 2023-06-01 DIAGNOSIS — M25561 Pain in right knee: Secondary | ICD-10-CM | POA: Diagnosis not present

## 2023-06-03 DIAGNOSIS — Z96651 Presence of right artificial knee joint: Secondary | ICD-10-CM | POA: Diagnosis not present

## 2023-06-03 DIAGNOSIS — M25561 Pain in right knee: Secondary | ICD-10-CM | POA: Diagnosis not present

## 2023-07-01 ENCOUNTER — Encounter: Payer: Medicare Other | Admitting: Family Medicine

## 2023-07-22 DIAGNOSIS — M7918 Myalgia, other site: Secondary | ICD-10-CM | POA: Diagnosis not present

## 2023-07-22 DIAGNOSIS — M0609 Rheumatoid arthritis without rheumatoid factor, multiple sites: Secondary | ICD-10-CM | POA: Diagnosis not present

## 2023-07-22 DIAGNOSIS — Z79899 Other long term (current) drug therapy: Secondary | ICD-10-CM | POA: Diagnosis not present

## 2023-07-22 DIAGNOSIS — M170 Bilateral primary osteoarthritis of knee: Secondary | ICD-10-CM | POA: Diagnosis not present

## 2023-07-23 ENCOUNTER — Inpatient Hospital Stay (HOSPITAL_BASED_OUTPATIENT_CLINIC_OR_DEPARTMENT_OTHER): Payer: Medicare Other | Admitting: Internal Medicine

## 2023-07-23 ENCOUNTER — Encounter: Payer: Self-pay | Admitting: Internal Medicine

## 2023-07-23 ENCOUNTER — Other Ambulatory Visit: Payer: Self-pay

## 2023-07-23 ENCOUNTER — Inpatient Hospital Stay: Payer: Medicare Other | Attending: Internal Medicine

## 2023-07-23 DIAGNOSIS — Z8261 Family history of arthritis: Secondary | ICD-10-CM | POA: Insufficient documentation

## 2023-07-23 DIAGNOSIS — Z886 Allergy status to analgesic agent status: Secondary | ICD-10-CM | POA: Insufficient documentation

## 2023-07-23 DIAGNOSIS — K648 Other hemorrhoids: Secondary | ICD-10-CM | POA: Insufficient documentation

## 2023-07-23 DIAGNOSIS — D509 Iron deficiency anemia, unspecified: Secondary | ICD-10-CM | POA: Diagnosis not present

## 2023-07-23 DIAGNOSIS — Z833 Family history of diabetes mellitus: Secondary | ICD-10-CM | POA: Diagnosis not present

## 2023-07-23 DIAGNOSIS — Z9889 Other specified postprocedural states: Secondary | ICD-10-CM | POA: Diagnosis not present

## 2023-07-23 DIAGNOSIS — Z90710 Acquired absence of both cervix and uterus: Secondary | ICD-10-CM | POA: Diagnosis not present

## 2023-07-23 DIAGNOSIS — F32A Depression, unspecified: Secondary | ICD-10-CM | POA: Insufficient documentation

## 2023-07-23 DIAGNOSIS — Z818 Family history of other mental and behavioral disorders: Secondary | ICD-10-CM | POA: Insufficient documentation

## 2023-07-23 DIAGNOSIS — E039 Hypothyroidism, unspecified: Secondary | ICD-10-CM | POA: Insufficient documentation

## 2023-07-23 DIAGNOSIS — M797 Fibromyalgia: Secondary | ICD-10-CM | POA: Insufficient documentation

## 2023-07-23 DIAGNOSIS — Z83438 Family history of other disorder of lipoprotein metabolism and other lipidemia: Secondary | ICD-10-CM | POA: Diagnosis not present

## 2023-07-23 DIAGNOSIS — Z823 Family history of stroke: Secondary | ICD-10-CM | POA: Diagnosis not present

## 2023-07-23 DIAGNOSIS — Z880 Allergy status to penicillin: Secondary | ICD-10-CM | POA: Diagnosis not present

## 2023-07-23 DIAGNOSIS — Z860100 Personal history of colon polyps, unspecified: Secondary | ICD-10-CM | POA: Diagnosis not present

## 2023-07-23 DIAGNOSIS — Z809 Family history of malignant neoplasm, unspecified: Secondary | ICD-10-CM | POA: Insufficient documentation

## 2023-07-23 DIAGNOSIS — Z87891 Personal history of nicotine dependence: Secondary | ICD-10-CM | POA: Insufficient documentation

## 2023-07-23 DIAGNOSIS — Z8349 Family history of other endocrine, nutritional and metabolic diseases: Secondary | ICD-10-CM | POA: Diagnosis not present

## 2023-07-23 DIAGNOSIS — M069 Rheumatoid arthritis, unspecified: Secondary | ICD-10-CM | POA: Insufficient documentation

## 2023-07-23 DIAGNOSIS — Z79899 Other long term (current) drug therapy: Secondary | ICD-10-CM | POA: Insufficient documentation

## 2023-07-23 DIAGNOSIS — K30 Functional dyspepsia: Secondary | ICD-10-CM | POA: Insufficient documentation

## 2023-07-23 DIAGNOSIS — Z883 Allergy status to other anti-infective agents status: Secondary | ICD-10-CM | POA: Diagnosis not present

## 2023-07-23 DIAGNOSIS — E785 Hyperlipidemia, unspecified: Secondary | ICD-10-CM | POA: Insufficient documentation

## 2023-07-23 DIAGNOSIS — Z8711 Personal history of peptic ulcer disease: Secondary | ICD-10-CM | POA: Diagnosis not present

## 2023-07-23 DIAGNOSIS — K219 Gastro-esophageal reflux disease without esophagitis: Secondary | ICD-10-CM | POA: Diagnosis not present

## 2023-07-23 LAB — CBC WITH DIFFERENTIAL/PLATELET
Abs Immature Granulocytes: 0.02 10*3/uL (ref 0.00–0.07)
Basophils Absolute: 0.1 10*3/uL (ref 0.0–0.1)
Basophils Relative: 1 %
Eosinophils Absolute: 0.5 10*3/uL (ref 0.0–0.5)
Eosinophils Relative: 6 %
HCT: 45.7 % (ref 36.0–46.0)
Hemoglobin: 15.3 g/dL — ABNORMAL HIGH (ref 12.0–15.0)
Immature Granulocytes: 0 %
Lymphocytes Relative: 27 %
Lymphs Abs: 2.1 10*3/uL (ref 0.7–4.0)
MCH: 30.7 pg (ref 26.0–34.0)
MCHC: 33.5 g/dL (ref 30.0–36.0)
MCV: 91.8 fL (ref 80.0–100.0)
Monocytes Absolute: 0.6 10*3/uL (ref 0.1–1.0)
Monocytes Relative: 8 %
Neutro Abs: 4.5 10*3/uL (ref 1.7–7.7)
Neutrophils Relative %: 58 %
Platelets: 265 10*3/uL (ref 150–400)
RBC: 4.98 MIL/uL (ref 3.87–5.11)
RDW: 13.8 % (ref 11.5–15.5)
WBC: 7.8 10*3/uL (ref 4.0–10.5)
nRBC: 0 % (ref 0.0–0.2)

## 2023-07-23 LAB — IRON AND TIBC
Iron: 95 ug/dL (ref 28–170)
Saturation Ratios: 24 % (ref 10.4–31.8)
TIBC: 393 ug/dL (ref 250–450)
UIBC: 298 ug/dL

## 2023-07-23 LAB — VITAMIN B12: Vitamin B-12: 366 pg/mL (ref 180–914)

## 2023-07-23 LAB — FERRITIN: Ferritin: 28 ng/mL (ref 11–307)

## 2023-07-23 NOTE — Progress Notes (Signed)
 [facility]  Telephone:(336) [phone] Fax:(336) [phone]  ID: Audrey Hamilton OB: Sep 18, 1945  MR#: [id number]  AOZ#:[id number]  Patient Care Team: Glori Luis, MD (Inactive) as PCP - General (Family Medicine) Michaelyn Barter, MD as Consulting Physician (Hematology and Oncology)  REFERRING PROVIDER: Dr. Birdie Sons  REASON FOR REFERRAL: Iron deficiency anemia  HPI: Audrey Hamilton is a 78 y.o. female with past medical history of rheumatoid arthritis on Plaquenil, GERD, gastric ulcer status post gastric surgery, hyperlipidemia, hypothyroidism, fibromyalgia, depression was referred to hematology for management of iron deficiency anemia.  Patient reports longstanding history of iron deficiency anemia.  She is on slow release iron but still has difficulty tolerating it.  She experiences GI upset.  Has history of gastric ulcers and had gastric surgery in 2000's.  Reports episodes of melanotic stools about 3 times a week. Colonoscopy done in March 2022 showed internal hemorrhoids, diverticula, multiple polyps with biopsies consistent with tubular adenoma.    Endoscopy with Dr. Mia Creek in June 2024 showed mild gastritis.  Colonoscopy showed 3 mm polyp at the ileocecal valve which was benign and internal hemorrhoids  Completed IV Venofer 200 mg weekly x 5 doses in March 2024  Interval history Patient was seen today as follow-up for iron deficiency anemia and labs. She reports feeling well.  With iron infusion, noticed improvement in her energy level.  Denies any bleeding in urine or stools. Patient underwent knee replacement in November and since then has been feeling great terms of ambulation. Continues to follow-up with Dr. Allena Katz rheumatology.  Stable on Plaquenil.  REVIEW OF SYSTEMS:   ROS  As per HPI. Otherwise, a complete review of systems is negative.  PAST MEDICAL HISTORY: Past Medical History:  Diagnosis Date   Allergy    Anxiety    controlled    Arthritis    Breast implant rupture    Cervical radiculopathy    Chicken pox    Colon polyps    Depression    Fibromyalgia    GERD (gastroesophageal reflux disease)    History of gastric surgery    History of hypothyroidism    Hyperlipidemia    Hypertension    Hypokalemia    Insomnia    Iron deficiency anemia    Multiple gastric ulcers    Pre-diabetes    Rheumatoid arthritis (HCC) Since 1995   currently in a flare up    PAST SURGICAL HISTORY: Past Surgical History:  Procedure Laterality Date   ABDOMINAL HYSTERECTOMY  1974   AUGMENTATION MAMMAPLASTY Bilateral 1970's   silicone   COLONOSCOPY WITH PROPOFOL N/A 07/04/2018   Procedure: COLONOSCOPY WITH PROPOFOL;  Surgeon: Scot Jun, MD;  Location: [facility] ENDOSCOPY;  Service: Endoscopy;  Laterality: N/A;   COLONOSCOPY WITH PROPOFOL N/A 09/22/2022   Procedure: COLONOSCOPY WITH PROPOFOL;  Surgeon: Regis Bill, MD;  Location: [facility] ENDOSCOPY;  Service: Endoscopy;  Laterality: N/A;   ESOPHAGOGASTRODUODENOSCOPY (EGD) WITH PROPOFOL N/A 09/22/2022   Procedure: ESOPHAGOGASTRODUODENOSCOPY (EGD) WITH PROPOFOL;  Surgeon: Regis Bill, MD;  Location: [facility] ENDOSCOPY;  Service: Endoscopy;  Laterality: N/A;   EYE SURGERY     FOOT SURGERY Left 11/22/10   MASTECTOMY Bilateral 1970's   SUBCUTANEOUS MASTECTOMY - NO CANCER   SMALL INTESTINE SURGERY  12/2001   STOMACH SURGERY  12/2001   TONSILLECTOMY AND ADENOIDECTOMY  1966   TOTAL KNEE ARTHROPLASTY Right 03/11/2023   Procedure: TOTAL KNEE ARTHROPLASTY;  Surgeon: Reinaldo Berber, MD;  Location: [facility] ORS;  Service: Orthopedics;  Laterality: Right;  UPPER GASTROINTESTINAL ENDOSCOPY      FAMILY HISTORY: Family History  Problem Relation Age of Onset   Arthritis Mother    Sudden death Mother        after birth of pt who was 10 days old.   Depression Mother    Arthritis Maternal Grandmother    Cancer Maternal Grandmother    Hyperlipidemia Maternal Grandfather    Diabetes Mellitus  II Father    Liver disease Father    Rheum arthritis Son    Stroke Son    Breast cancer Neg Hx     HEALTH MAINTENANCE: Social History   Tobacco Use   Smoking status: Former    Current packs/day: 0.00    Types: Cigarettes    Quit date: 06/19/1995    Years since quitting: 28.1   Smokeless tobacco: Never  Vaping Use   Vaping status: Never Used  Substance Use Topics   Alcohol use: Yes    Alcohol/week: 0.0 - 1.0 standard drinks of alcohol    Comment: occ   Drug use: No     Allergies  Allergen Reactions   Amoxicillin-Pot Clavulanate    Aspirin     Aspirin cream only    Baclofen     Made pt feel dizzy    Fluconazole    Oxaprozin     Current Outpatient Medications  Medication Sig Dispense Refill   atorvastatin (LIPITOR) 80 MG tablet TAKE 1 TABLET (80 MG TOTAL) BY MOUTH DAILY FOR 180 DOSES. 90 tablet 3   carvedilol (COREG) 6.25 MG tablet TAKE 1 TABLET BY MOUTH 2 TIMES DAILY WITH A MEAL. 180 tablet 1   cholecalciferol (VITAMIN D3) 25 MCG (1000 UNIT) tablet Take 1,000 Units by mouth daily.     hydrochlorothiazide (HYDRODIURIL) 25 MG tablet TAKE 1 TABLET (25 MG TOTAL) BY MOUTH DAILY. 90 tablet 3   hydroxychloroquine (PLAQUENIL) 200 MG tablet Take 200 mg by mouth 2 (two) times daily.     Melatonin 10 MG TABS Take 1 tablet by mouth at bedtime.     Multiple Vitamins-Minerals (CENTRUM SILVER ADULT 50+) TABS Take 1 tablet by mouth daily.     omega-3 acid ethyl esters (LOVAZA) 1 g capsule TAKE 2 CAPSULES BY MOUTH TWICE A DAY 360 capsule 0   pantoprazole (PROTONIX) 40 MG tablet TAKE 1 TABLET BY MOUTH 2 TIMES DAILY BEFORE A MEAL FOR 14 DAYS, THEN 1 TABLET (40 MG TOTAL) DAILY BEFORE BREAKFAST. 180 tablet 1   acetaminophen (TYLENOL) 500 MG tablet Take 2 tablets (1,000 mg total) by mouth every 8 (eight) hours. (Patient not taking: Reported on 07/23/2023) 30 tablet 0   No current facility-administered medications for this visit.    OBJECTIVE: Vitals:   07/23/23 1341  BP: 133/84  Pulse:  78  Resp: 20  Temp: 97.8 F (36.6 C)     Body mass index is 25.82 kg/m.      General: Well-developed, well-nourished, no acute distress. Eyes: Pink conjunctiva, anicteric sclera. HEENT: Normocephalic, moist mucous membranes, clear oropharnyx. Lungs: Clear to auscultation bilaterally. Heart: Regular rate and rhythm. No rubs, murmurs, or gallops. Abdomen: Soft, nontender, nondistended. No organomegaly noted, normoactive bowel sounds. Musculoskeletal: No edema, cyanosis, or clubbing. Neuro: Alert, answering all questions appropriately. Cranial nerves grossly intact. Skin: No rashes or petechiae noted. Psych: Normal affect. Lymphatics: No cervical, calvicular, axillary or inguinal LAD.   LAB RESULTS:  Lab Results  Component Value Date   NA 137 03/12/2023   K 3.5 03/12/2023   CL 102  03/12/2023   CO2 24 03/12/2023   GLUCOSE 139 (H) 03/12/2023   BUN 15 03/12/2023   CREATININE 0.73 03/12/2023   CALCIUM 9.1 03/12/2023   PROT 6.9 03/01/2023   ALBUMIN 4.1 03/01/2023   AST 18 03/01/2023   ALT 18 03/01/2023   ALKPHOS 46 03/01/2023   BILITOT 0.4 03/01/2023   GFRNONAA >60 03/12/2023   GFRAA 117 05/12/2019    Lab Results  Component Value Date   WBC 7.8 07/23/2023   NEUTROABS 4.5 07/23/2023   HGB 15.3 (H) 07/23/2023   HCT 45.7 07/23/2023   MCV 91.8 07/23/2023   PLT 265 07/23/2023    Lab Results  Component Value Date   TIBC 434 10/26/2022   TIBC 445.2 02/17/2022   TIBC 404.6 01/14/2022   FERRITIN 60 10/26/2022   FERRITIN 14.7 02/17/2022   FERRITIN 21.4 01/14/2022   IRONPCTSAT 25 10/26/2022   IRONPCTSAT 13.3 (L) 02/17/2022   IRONPCTSAT 11.9 (L) 01/14/2022     STUDIES: No results found.  ASSESSMENT AND PLAN:   Audrey Hamilton is a 78 y.o. female with pmh of rheumatoid arthritis on Plaquenil, GERD, gastric ulcer status post gastric surgery, hyperlipidemia, hypothyroidism, fibromyalgia, depression was referred to hematology for management of iron deficiency  anemia.  # Iron deficiency anemia -Not responding to oral iron.  Also difficulty tolerating oral iron due to GI upset.  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   -Endoscopy with Dr. Mia Creek in June 2024 showed mild gastritis.  Colonoscopy showed 3 mm polyp at the ileocecal valve which was benign and internal hemorrhoids -Completed IV Venofer 200 mg weekly x 5 doses in March 2024 -Hemoglobin 15.7.  Iron level is pending.  If low, I will reach out to the patient.  #H/o gastric surgery # Vitamin B12 monitoring -Vitamin B12 level pending from today.  Resume B12 supplements.  # Rheumatoid arthritis # Fibromyalgia -Follows with Dr. Allena Katz.  On Plaquenil.  Currently on prednisone.  Orders Placed This Encounter  Procedures   Vitamin B12   CBC with Differential/Platelet   Ferritin   Iron and TIBC    RTC in 8 months for MD visit, labs, possible Venofer.  Patient expressed understanding and was in agreement with this plan. She also understands that She can call clinic at any time with any questions, concerns, or complaints.   I spent a total of 25 minutes reviewing chart data, face-to-face evaluation with the patient, counseling and coordination of care as detailed above.  Michaelyn Barter, MD   07/23/2023 2:42 PM

## 2023-07-30 ENCOUNTER — Encounter

## 2023-07-30 DIAGNOSIS — E782 Mixed hyperlipidemia: Secondary | ICD-10-CM

## 2023-07-30 DIAGNOSIS — I10 Essential (primary) hypertension: Secondary | ICD-10-CM

## 2023-07-30 DIAGNOSIS — K219 Gastro-esophageal reflux disease without esophagitis: Secondary | ICD-10-CM

## 2023-08-23 ENCOUNTER — Telehealth: Payer: Self-pay

## 2023-08-23 ENCOUNTER — Encounter: Payer: Self-pay | Admitting: Internal Medicine

## 2023-08-23 ENCOUNTER — Ambulatory Visit (INDEPENDENT_AMBULATORY_CARE_PROVIDER_SITE_OTHER): Admitting: Internal Medicine

## 2023-08-23 VITALS — BP 126/76 | HR 67 | Temp 97.7°F | Ht 64.0 in | Wt 149.6 lb

## 2023-08-23 DIAGNOSIS — G4700 Insomnia, unspecified: Secondary | ICD-10-CM | POA: Diagnosis not present

## 2023-08-23 DIAGNOSIS — I10 Essential (primary) hypertension: Secondary | ICD-10-CM

## 2023-08-23 DIAGNOSIS — D229 Melanocytic nevi, unspecified: Secondary | ICD-10-CM | POA: Insufficient documentation

## 2023-08-23 DIAGNOSIS — E782 Mixed hyperlipidemia: Secondary | ICD-10-CM

## 2023-08-23 DIAGNOSIS — K219 Gastro-esophageal reflux disease without esophagitis: Secondary | ICD-10-CM | POA: Diagnosis not present

## 2023-08-23 MED ORDER — CARVEDILOL 6.25 MG PO TABS: 6.2500 mg | ORAL_TABLET | Freq: Two times a day (BID) | ORAL | 1 refills | Status: DC

## 2023-08-23 MED ORDER — TRAZODONE HCL 50 MG PO TABS: 25.0000 mg | ORAL_TABLET | Freq: Every evening | ORAL | 3 refills | Status: DC | PRN

## 2023-08-23 MED ORDER — ATORVASTATIN CALCIUM 80 MG PO TABS: 80.0000 mg | ORAL_TABLET | Freq: Every day | ORAL | 3 refills | Status: DC

## 2023-08-23 MED ORDER — PANTOPRAZOLE SODIUM 40 MG PO TBEC: 40.0000 mg | DELAYED_RELEASE_TABLET | Freq: Every day | ORAL | 1 refills | Status: DC

## 2023-08-23 MED ORDER — HYDROCHLOROTHIAZIDE 25 MG PO TABS: 25.0000 mg | ORAL_TABLET | Freq: Every day | ORAL | 3 refills | Status: DC

## 2023-08-23 NOTE — Progress Notes (Signed)
 Acute Office Visit  Subjective:     Patient ID: Audrey Hamilton, female    DOB: 11-21-1945, 78 y.o.   MRN: [id number]  Chief Complaint  Patient presents with   Acute Visit    Sore mole on back of left shoulder & itchy sometimes     HPI Patient is in today for an atypical mole over the left side of her upper back.  Patient states that she has had the mole for about 3 to 4 months but it has become painful over the last 2 weeks and has changed.  Her daughter who is a Engineer, civil (consulting) saw the mall and advised her to come in for further evaluation.  She denies any bleeding at the site.  Patient also requires refills of her antihypertensive medications and cholesterol medication as well as her GERD medication.  She would also like something for her insomnia.  She states that she has been on melatonin which initially worked but is now not effective.  Review of Systems  Constitutional: Negative.   HENT: Negative.    Respiratory: Negative.    Cardiovascular: Negative.   Gastrointestinal: Negative.   Skin:        Atypical mole over her left upper back  Neurological: Negative.   Psychiatric/Behavioral: Negative.          Objective:    BP 126/76   Pulse 67   Temp 97.7 F (36.5 C)   Ht 5\' 4"  (1.626 m)   Wt 149 lb 9.6 oz (67.9 kg)   SpO2 96%   BMI 25.68 kg/m    Physical Exam Constitutional:      Appearance: Normal appearance.  HENT:     Head: Normocephalic and atraumatic.  Cardiovascular:     Rate and Rhythm: Normal rate and regular rhythm.     Heart sounds: Normal heart sounds.  Pulmonary:     Effort: No respiratory distress.     Breath sounds: Normal breath sounds. No wheezing.  Abdominal:     General: Bowel sounds are normal. There is no distension.     Palpations: Abdomen is soft.     Tenderness: There is no abdominal tenderness.  Skin:    Comments: Raised mole over her left upper back with tenderness to palpation.  No bleeding or discharge noted.    Neurological:      Mental Status: She is alert and oriented to person, place, and time.  Psychiatric:        Mood and Affect: Mood normal.        Behavior: Behavior normal.      No results found for any visits on 08/23/23.      Assessment & Plan:   Problem List Items Addressed This Visit       Cardiovascular and Mediastinum   Essential hypertension (Chronic)   - This pelvis chronic and stable -Patient's blood pressure today is 126/76 -She is currently on hydrochlorothiazide  25 mg daily as well as carvedilol  6.25 mg twice daily -Will continue current regimen for now -I have put in refills for both these medications -Will check a BMP today as well      Relevant Medications   atorvastatin  (LIPITOR) 80 MG tablet   carvedilol  (COREG ) 6.25 MG tablet   hydrochlorothiazide  (HYDRODIURIL ) 25 MG tablet   Other Relevant Orders   Basic metabolic panel with GFR     Digestive   GERD (gastroesophageal reflux disease)   - This problem is chronic and stable -Patient had an EGD last  year in June which showed mild gastritis -She does have a history of gastric ulcer as well -Will continue with Protonix  40 mg daily for now.  Refill placed to the pharmacy today -No further workup at this time      Relevant Medications   pantoprazole  (PROTONIX ) 40 MG tablet     Other   Insomnia - Primary (Chronic)   - Patient has a history of chronic insomnia -She was initially on Ambien for this but did have sleepwalking and binge eating while on this medication and it was stopped -Patient was trialed on melatonin with initial improvement in her symptoms but now patient states this medication is ineffective -Will trial the patient on trazodone (25 mg at night) to see if this helps with her symptoms -No further workup at this time      Relevant Medications   traZODone (DESYREL) 50 MG tablet   Atypical mole   - Patient states that she has had a mole over her left upper back for the last 3 to 4 months but states that  this is become painful over the last 2 weeks and is also become more raised -She denies any bleeding or discharge from the mole -I am concerned for possible malignancy -I put an urgent referral to dermatology for further evaluation -Will follow-up on the recommendations      Relevant Orders   Ambulatory referral to Dermatology   HLD (hyperlipidemia)   - This problem is chronic and stable -Patient is on Lipitor 80 mg daily -We will recheck her lipid panel today -Refill for Lipitor sent to her pharmacy -No further workup at this time      Relevant Medications   atorvastatin  (LIPITOR) 80 MG tablet   carvedilol  (COREG ) 6.25 MG tablet   hydrochlorothiazide  (HYDRODIURIL ) 25 MG tablet   Other Relevant Orders   Lipid panel    Meds ordered this encounter  Medications   atorvastatin  (LIPITOR) 80 MG tablet    Sig: Take 1 tablet (80 mg total) by mouth daily for 180 doses.    Dispense:  90 tablet    Refill:  3   pantoprazole  (PROTONIX ) 40 MG tablet    Sig: Take 1 tablet (40 mg total) by mouth daily.    Dispense:  180 tablet    Refill:  1   carvedilol  (COREG ) 6.25 MG tablet    Sig: Take 1 tablet (6.25 mg total) by mouth 2 (two) times daily with a meal.    Dispense:  180 tablet    Refill:  1   hydrochlorothiazide  (HYDRODIURIL ) 25 MG tablet    Sig: Take 1 tablet (25 mg total) by mouth daily.    Dispense:  90 tablet    Refill:  3   traZODone (DESYREL) 50 MG tablet    Sig: Take 0.5-1 tablets (25-50 mg total) by mouth at bedtime as needed for sleep.    Dispense:  30 tablet    Refill:  3    No follow-ups on file.  Moe Graca, MD

## 2023-08-23 NOTE — Telephone Encounter (Signed)
 Copied from CRM [id number]. Topic: Clinical - Medication Refill >> Aug 23, 2023 10:44 AM Keitha Pata L wrote: Most Recent Primary Care Visit:  Provider: Lovetta Rucks ERIC G  Department: [facility]  Visit Type: OFFICE VISIT  Date: 03/31/2023  Medication: atorvastatin  (LIPITOR) 80 MG tablet pantoprazole  (PROTONIX ) 40 MG tablet  Has the patient contacted their pharmacy? No (Agent: If no, request that the patient contact the pharmacy for the refill. If patient does not wish to contact the pharmacy document the reason why and proceed with request.) (Agent: If yes, when and what did the pharmacy advise?)  Is this the correct pharmacy for this prescription? Yes If no, delete pharmacy and type the correct one.  This is the patient's preferred pharmacy:  [id number] - [address] [place] [address] Phone: [phone] Fax: [phone]   Has the prescription been filled recently? No  Is the patient out of the medication? Yes  Has the patient been seen for an appointment in the last year OR does the patient have an upcoming appointment? No  Can we respond through MyChart? No  Agent: Please be advised that Rx refills may take up to 3 business days. We ask that you follow-up with your pharmacy.

## 2023-08-23 NOTE — Telephone Encounter (Signed)
 Pt also seeing you for mole on her shoulder.

## 2023-08-23 NOTE — Assessment & Plan Note (Signed)
-   This pelvis chronic and stable -Patient's blood pressure today is 126/76 -She is currently on hydrochlorothiazide  25 mg daily as well as carvedilol  6.25 mg twice daily -Will continue current regimen for now -I have put in refills for both these medications -Will check a BMP today as well

## 2023-08-23 NOTE — Assessment & Plan Note (Signed)
-   This problem is chronic and stable -Patient is on Lipitor 80 mg daily -We will recheck her lipid panel today -Refill for Lipitor sent to her pharmacy -No further workup at this time

## 2023-08-23 NOTE — Assessment & Plan Note (Signed)
-   Patient states that she has had a mole over her left upper back for the last 3 to 4 months but states that this is become painful over the last 2 weeks and is also become more raised -She denies any bleeding or discharge from the mole -I am concerned for possible malignancy -I put an urgent referral to dermatology for further evaluation -Will follow-up on the recommendations

## 2023-08-23 NOTE — Assessment & Plan Note (Signed)
-   This problem is chronic and stable -Patient had an EGD last year in June which showed mild gastritis -She does have a history of gastric ulcer as well -Will continue with Protonix  40 mg daily for now.  Refill placed to the pharmacy today -No further workup at this time

## 2023-08-23 NOTE — Assessment & Plan Note (Signed)
-   Patient has a history of chronic insomnia -She was initially on Ambien for this but did have sleepwalking and binge eating while on this medication and it was stopped -Patient was trialed on melatonin with initial improvement in her symptoms but now patient states this medication is ineffective -Will trial the patient on trazodone (25 mg at night) to see if this helps with her symptoms -No further workup at this time

## 2023-08-23 NOTE — Patient Instructions (Addendum)
-   It was a pleasure meeting you today -I put in a refill for your medications for your hypertension and cholesterol -We will check some blood work on you today including your kidney function as well as your cholesterol levels -We will start you on trazodone for insomnia.  Please start with half tablet at night to see if this helps with your insomnia -I have put in an urgent referral to dermatology for the mole on your back.  I am concerned for possible malignancy -Please contact us  if any questions or concerns or if your symptoms worsen

## 2023-08-24 LAB — LIPID PANEL
Cholesterol: 143 mg/dL (ref 0–200)
HDL: 56.4 mg/dL (ref >39.00)
LDL Cholesterol: 56 mg/dL (ref 0–99)
NonHDL: 86.93
Total CHOL/HDL Ratio: 3
Triglycerides: 153 mg/dL — ABNORMAL HIGH (ref 0.0–149.0)
VLDL: 30.6 mg/dL (ref 0.0–40.0)

## 2023-08-24 LAB — BASIC METABOLIC PANEL WITH GFR
BUN: 17 mg/dL (ref 6–23)
CO2: 24 meq/L (ref 19–32)
Calcium: 9.6 mg/dL (ref 8.4–10.5)
Chloride: 109 meq/L (ref 96–112)
Creatinine, Ser: 0.65 mg/dL (ref 0.40–1.20)
GFR: 85.04 mL/min (ref >60.00)
Glucose, Bld: 96 mg/dL (ref 70–99)
Potassium: 4.1 meq/L (ref 3.5–5.1)
Sodium: 143 meq/L (ref 135–145)

## 2023-09-02 DIAGNOSIS — H35033 Hypertensive retinopathy, bilateral: Secondary | ICD-10-CM | POA: Diagnosis not present

## 2023-09-02 DIAGNOSIS — H524 Presbyopia: Secondary | ICD-10-CM | POA: Diagnosis not present

## 2023-09-02 DIAGNOSIS — H02831 Dermatochalasis of right upper eyelid: Secondary | ICD-10-CM | POA: Diagnosis not present

## 2023-09-02 DIAGNOSIS — Z79899 Other long term (current) drug therapy: Secondary | ICD-10-CM | POA: Diagnosis not present

## 2023-09-02 DIAGNOSIS — H26493 Other secondary cataract, bilateral: Secondary | ICD-10-CM | POA: Diagnosis not present

## 2023-09-02 DIAGNOSIS — I10 Essential (primary) hypertension: Secondary | ICD-10-CM | POA: Diagnosis not present

## 2023-09-02 DIAGNOSIS — H02834 Dermatochalasis of left upper eyelid: Secondary | ICD-10-CM | POA: Diagnosis not present

## 2023-09-02 DIAGNOSIS — M0560 Rheumatoid arthritis of unspecified site with involvement of other organs and systems: Secondary | ICD-10-CM | POA: Diagnosis not present

## 2023-09-06 DIAGNOSIS — C44529 Squamous cell carcinoma of skin of other part of trunk: Secondary | ICD-10-CM | POA: Diagnosis not present

## 2023-09-06 DIAGNOSIS — D485 Neoplasm of uncertain behavior of skin: Secondary | ICD-10-CM | POA: Diagnosis not present

## 2023-09-08 DIAGNOSIS — Z96651 Presence of right artificial knee joint: Secondary | ICD-10-CM | POA: Diagnosis not present

## 2023-09-22 DIAGNOSIS — C44529 Squamous cell carcinoma of skin of other part of trunk: Secondary | ICD-10-CM | POA: Diagnosis not present

## 2023-10-11 ENCOUNTER — Other Ambulatory Visit: Payer: Self-pay | Admitting: Internal Medicine

## 2023-10-11 DIAGNOSIS — G4700 Insomnia, unspecified: Secondary | ICD-10-CM

## 2023-10-11 NOTE — Telephone Encounter (Signed)
 NOT [facility] PATIENT

## 2023-11-19 ENCOUNTER — Ambulatory Visit: Payer: Medicare Other | Admitting: *Deleted

## 2023-11-19 VITALS — Ht 64.0 in | Wt 149.0 lb

## 2023-11-19 DIAGNOSIS — Z0000 Encounter for general adult medical examination without abnormal findings: Secondary | ICD-10-CM

## 2023-11-19 NOTE — Patient Instructions (Signed)
 Ms. Crumrine , Thank you for taking time out of your busy schedule to complete your Annual Wellness Visit with me. I enjoyed our conversation and look forward to speaking with you again next year. I, as well as your care team,  appreciate your ongoing commitment to your health goals. Please review the following plan we discussed and let me know if I can assist you in the future. Your Game plan/ To Do List    Referrals: If you haven't heard from the office you've been referred to, please reach out to them at the phone provided.  Remember to update your flu and covid vaccines annually  Follow up Visits: We will see or speak with you next year for your Next Medicare AWV with our clinical staff 11/21/24 @ 10:50 Have you seen your provider in the last 6 months (3 months if uncontrolled diabetes)? Yes  Clinician Recommendations:  Aim for 30 minutes of exercise or brisk walking, 6-8 glasses of water, and 5 servings of fruits and vegetables each day.       This is a list of the screenings recommended for you:  Health Maintenance  Topic Date Due   COVID-19 Vaccine (7 - Pfizer risk 2024-25 season) 11/15/2023   Flu Shot  11/19/2023   Mammogram  02/01/2024   Medicare Annual Wellness Visit  11/18/2024   Colon Cancer Screening  09/21/2025   DTaP/Tdap/Td vaccine (2 - Td or Tdap) 11/01/2028   Pneumococcal Vaccine for age over 55  Completed   DEXA scan (bone density measurement)  Completed   Hepatitis C Screening  Completed   Zoster (Shingles) Vaccine  Completed   Hepatitis B Vaccine  Aged Out   HPV Vaccine  Aged Out   Meningitis B Vaccine  Aged Out    Advanced directives: (Copy Requested) Please bring a copy of your health care power of attorney and living will to the office to be added to your chart at your convenience. You can mail to [facility] [address] [place], [place] [address] or email to [email or web address] Advance Care Planning is important because it:  [x]   Makes sure you receive the medical care that is consistent with your values, goals, and preferences  [x]  It provides guidance to your family and loved ones and reduces their decisional burden about whether or not they are making the right decisions based on your wishes.

## 2023-11-19 NOTE — Progress Notes (Signed)
 Subjective:   Audrey Hamilton is a 78 y.o. who presents for a Medicare Wellness preventive visit.  As a reminder, Annual Wellness Visits don't include a physical exam, and some assessments may be limited, especially if this visit is performed virtually. We may recommend an in-person follow-up visit with your provider if needed.  Visit Complete: Virtual I connected with  Audrey Hamilton on 11/19/23 by a audio enabled telemedicine application and verified that I am speaking with the correct person using two identifiers.  Patient Location: Home  Provider Location: Home Office  I discussed the limitations of evaluation and management by telemedicine. The patient expressed understanding and agreed to proceed.  Vital Signs: Because this visit was a virtual/telehealth visit, some criteria may be missing or patient reported. Any vitals not documented were not able to be obtained and vitals that have been documented are patient reported.  VideoDeclined- This patient declined Librarian, academic. Therefore the visit was completed with audio only.  Persons Participating in Visit: Patient.  AWV Questionnaire: No: Patient Medicare AWV questionnaire was not completed prior to this visit.  Cardiac Risk Factors include: advanced age (>32men, >38 women);dyslipidemia;hypertension     Objective:    Today's Vitals   11/19/23 0955  Weight: 149 lb (67.6 kg)  Height: 5' 4 (1.626 m)   Body mass index is 25.58 kg/m.     11/19/2023   10:09 AM 07/23/2023    1:38 PM 03/11/2023    4:00 PM 03/01/2023    8:29 AM 10/26/2022    1:18 PM 09/22/2022    9:26 AM 07/03/2022    3:30 PM  Advanced Directives  Does Patient Have a Medical Advance Directive? Yes No Yes Yes No Yes Yes  Type of Estate agent of [place];Living will  Living will Healthcare Power of [place];Living will  Living will;Healthcare Power of Attorney Living will;Healthcare Power of Attorney   Does patient want to make changes to medical advance directive?   No - Patient declined No - Guardian declined     Copy of Healthcare Power of Attorney in Chart? No - copy requested     No - copy requested No - copy requested  Would patient like information on creating a medical advance directive?  No - Patient declined  No - Patient declined   No - Patient declined    Current Medications (verified) Outpatient Encounter Medications as of 11/19/2023  Medication Sig   acetaminophen  (TYLENOL ) 500 MG tablet Take 2 tablets (1,000 mg total) by mouth every 8 (eight) hours.   atorvastatin  (LIPITOR) 80 MG tablet Take 1 tablet (80 mg total) by mouth daily for 180 doses.   carvedilol  (COREG ) 6.25 MG tablet Take 1 tablet (6.25 mg total) by mouth 2 (two) times daily with a meal.   hydrochlorothiazide  (HYDRODIURIL ) 25 MG tablet Take 1 tablet (25 mg total) by mouth daily.   hydroxychloroquine  (PLAQUENIL ) 200 MG tablet Take 200 mg by mouth 2 (two) times daily.   Melatonin 10 MG TABS Take 1 tablet by mouth at bedtime.   pantoprazole  (PROTONIX ) 40 MG tablet Take 1 tablet (40 mg total) by mouth daily.   traZODone  (DESYREL ) 50 MG tablet Take 0.5-1 tablets (25-50 mg total) by mouth at bedtime as needed for sleep.   No facility-administered encounter medications on file as of 11/19/2023.    Allergies (verified) Amoxicillin-pot clavulanate, Aspirin, Baclofen , Fluconazole, and Oxaprozin   History: Past Medical History:  Diagnosis Date   Allergy  Anxiety    controlled   Arthritis    Breast implant rupture    Cervical radiculopathy    Chicken pox    Colon polyps    Depression    Fibromyalgia    GERD (gastroesophageal reflux disease)    History of gastric surgery    History of hypothyroidism    Hyperlipidemia    Hypertension    Hypokalemia    Insomnia    Iron  deficiency anemia    Multiple gastric ulcers    Pre-diabetes    Rheumatoid arthritis (HCC) Since 1995   currently in a flare up   Past  Surgical History:  Procedure Laterality Date   ABDOMINAL HYSTERECTOMY  1974   AUGMENTATION MAMMAPLASTY Bilateral 1970's   silicone   COLONOSCOPY WITH PROPOFOL  N/A 07/04/2018   Procedure: COLONOSCOPY WITH PROPOFOL ;  Surgeon: Viktoria Lamar DASEN, MD;  Location: [facility] ENDOSCOPY;  Service: Endoscopy;  Laterality: N/A;   COLONOSCOPY WITH PROPOFOL  N/A 09/22/2022   Procedure: COLONOSCOPY WITH PROPOFOL ;  Surgeon: Maryruth Ole DASEN, MD;  Location: [facility] ENDOSCOPY;  Service: Endoscopy;  Laterality: N/A;   ESOPHAGOGASTRODUODENOSCOPY (EGD) WITH PROPOFOL  N/A 09/22/2022   Procedure: ESOPHAGOGASTRODUODENOSCOPY (EGD) WITH PROPOFOL ;  Surgeon: Maryruth Ole DASEN, MD;  Location: [facility] ENDOSCOPY;  Service: Endoscopy;  Laterality: N/A;   EYE SURGERY     FOOT SURGERY Left 11/22/10   MASTECTOMY Bilateral 1970's   SUBCUTANEOUS MASTECTOMY - NO CANCER   SMALL INTESTINE SURGERY  12/2001   STOMACH SURGERY  12/2001   TONSILLECTOMY AND ADENOIDECTOMY  1966   TOTAL KNEE ARTHROPLASTY Right 03/11/2023   Procedure: TOTAL KNEE ARTHROPLASTY;  Surgeon: Lorelle Hussar, MD;  Location: [facility] ORS;  Service: Orthopedics;  Laterality: Right;   UPPER GASTROINTESTINAL ENDOSCOPY     Family History  Problem Relation Age of Onset   Arthritis Mother    Sudden death Mother        after birth of pt who was 27 days old.   Depression Mother    Arthritis Maternal Grandmother    Cancer Maternal Grandmother    Hyperlipidemia Maternal Grandfather    Diabetes Mellitus II Father    Liver disease Father    Rheum arthritis Son    Stroke Son    Breast cancer Neg Hx    Social History   Socioeconomic History   Marital status: Widowed    Spouse name: Not on file   Number of children: Not on file   Years of education: Not on file   Highest education level: 9th grade  Occupational History   Not on file  Tobacco Use   Smoking status: Former    Current packs/day: 0.00    Types: Cigarettes    Quit date: 06/19/1995    Years since quitting: 28.4    Smokeless tobacco: Never  Vaping Use   Vaping status: Never Used  Substance and Sexual Activity   Alcohol use: Yes    Alcohol/week: 0.0 - 1.0 standard drinks of alcohol    Comment: occ   Drug use: No   Sexual activity: Not on file  Other Topics Concern   Not on file  Social History Narrative   Not on file   Social Drivers of Health   Financial Resource Strain: Low Risk  (11/19/2023)   Overall Financial Resource Strain (CARDIA)    Difficulty of Paying Living Expenses: Not hard at all  Food Insecurity: No Food Insecurity (11/19/2023)   Hunger Vital Sign    Worried About Running Out of Food in the Last  Year: Never true    Ran Out of Food in the Last Year: Never true  Transportation Needs: No Transportation Needs (11/19/2023)   PRAPARE - Administrator, Civil Service (Medical): No    Lack of Transportation (Non-Medical): No  Physical Activity: Insufficiently Active (11/19/2023)   Exercise Vital Sign    Days of Exercise per Week: 3 days    Minutes of Exercise per Session: 30 min  Stress: No Stress Concern Present (11/19/2023)   [employer] of Occupational Health - Occupational Stress Questionnaire    Feeling of Stress: Not at all  Social Connections: Moderately Isolated (11/19/2023)   Social Connection and Isolation Panel    Frequency of Communication with Friends and Family: More than three times a week    Frequency of Social Gatherings with Friends and Family: More than three times a week    Attends Religious Services: Never    Database administrator or Organizations: Yes    Attends Engineer, structural: More than 4 times per year    Marital Status: Widowed    Tobacco Counseling Counseling given: Not Answered    Clinical Intake:  Pre-visit preparation completed: Yes  Pain : No/denies pain     BMI - recorded: 25.58 Nutritional Status: BMI 25 -29 Overweight Nutritional Risks: None Diabetes: No  Lab Results  Component Value Date   HGBA1C 5.8  08/18/2022   HGBA1C 5.7 07/04/2021   HGBA1C 5.8 05/13/2020     How often do you need to have someone help you when you read instructions, pamphlets, or other written materials from your doctor or pharmacy?: 1 - Never  Interpreter Needed?: No  Information entered by :: R. Jru Pense LPN   Activities of Daily Living     11/19/2023    9:57 AM 03/11/2023    4:00 PM  In your present state of health, do you have any difficulty performing the following activities:  Hearing? 0   Vision? 0   Difficulty concentrating or making decisions? 0   Walking or climbing stairs? 1   Dressing or bathing? 0   Doing errands, shopping? 0 0  Preparing Food and eating ? N   Using the Toilet? N   In the past six months, have you accidently leaked urine? N   Do you have problems with loss of bowel control? N   Managing your Medications? N   Managing your Finances? N   Housekeeping or managing your Housekeeping? N     Patient Care Team: Rennie Cindy SAUNDERS, MD as Consulting Physician (Oncology) Tobie Lady POUR, MD as Consulting Physician (Rheumatology) Lorelle Hussar, MD as Consulting Physician (Orthopedic Surgery)  I have updated your Care Teams any recent Medical Services you may have received from other providers in the past year.     Assessment:   This is a routine wellness examination for Audrey Hamilton.  Hearing/Vision screen Hearing Screening - Comments:: No issues Vision Screening - Comments:: glasses   Goals Addressed             This Visit's Progress    Patient Stated       Wants to continue to walk and exercise       Depression Screen     11/19/2023   10:03 AM 08/23/2023    2:04 PM 12/30/2022   11:28 AM 11/25/2022   11:47 AM 11/16/2022    1:41 PM 08/21/2022   11:30 AM 06/25/2022   11:11 AM  PHQ 2/9 Scores  PHQ -  2 Score 0 0 0 0 0 0 0  PHQ- 9 Score 0 0 0 3 3 6      Fall Risk     11/19/2023    9:59 AM 08/23/2023    2:04 PM 03/31/2023    2:44 PM 12/30/2022   11:28 AM 11/25/2022   11:46 AM   Fall Risk   Falls in the past year? 0 0 0 1 1  Number falls in past yr: 0 0 0 0 0  Injury with Fall? 0 0 0 0 0  Risk for fall due to : No Fall Risks No Fall Risks No Fall Risks History of fall(s) History of fall(s);Impaired balance/gait  Follow up Falls evaluation completed;Falls prevention discussed Falls evaluation completed Falls evaluation completed Falls evaluation completed Falls evaluation completed    MEDICARE RISK AT HOME:  Medicare Risk at Home Any stairs in or around the home?: No If so, are there any without handrails?: No Home free of loose throw rugs in walkways, pet beds, electrical cords, etc?: Yes Adequate lighting in your home to reduce risk of falls?: Yes Life alert?: Yes Use of a cane, walker or w/c?: Yes (cane) Grab bars in the bathroom?: Yes Shower chair or bench in shower?: Yes Elevated toilet seat or a handicapped toilet?: Yes  TIMED UP AND GO:  Was the test performed?  No  Cognitive Function: 6CIT completed    10/27/2017    9:42 AM  MMSE - Mini Mental State Exam  Orientation to time 5  Orientation to Place 5  Registration 3  Attention/ Calculation 5  Recall 3  Language- name 2 objects 2  Language- repeat 1  Language- follow 3 step command 3  Language- read & follow direction 1  Write a sentence 1  Copy design 1  Total score 30        11/19/2023   10:10 AM 11/16/2022    2:14 PM 10/31/2018    3:39 PM  6CIT Screen  What Year? 0 points 0 points 0 points  What month? 0 points 0 points 0 points  What time? 0 points 0 points 0 points  Count back from 20 0 points 0 points 0 points  Months in reverse 0 points 0 points 0 points  Repeat phrase 2 points 0 points 0 points  Total Score 2 points 0 points 0 points    Immunizations Immunization History  Administered Date(s) Administered   Fluad Quad(high Dose 65+) 01/06/2022   Influenza, High Dose Seasonal PF 12/12/2016, 01/17/2018, 12/03/2018, 12/03/2018, 12/27/2019, 01/06/2022, 11/28/2022    Influenza-Unspecified 12/20/2014, 01/02/2016, 12/16/2020   PFIZER Comirnaty(Gray Top)Covid-19 Tri-Sucrose Vaccine 09/18/2020   PFIZER(Purple Top)SARS-COV-2 Vaccination 05/25/2019, 06/24/2019, 02/26/2020   PNEUMOCOCCAL CONJUGATE-20 05/20/2022   Pfizer(Comirnaty)Fall Seasonal Vaccine 12 years and older 01/06/2022, 05/18/2023   Pneumococcal Polysaccharide-23 12/03/2018   Respiratory Syncytial Virus Vaccine,Recomb Aduvanted(Arexvy) 03/26/2022   Tdap 11/02/2018   Unspecified SARS-COV-2 Vaccination 01/06/2022   Zoster Recombinant(Shingrix) 08/05/2022, 11/28/2022   Zoster, Live 05/09/1997    Screening Tests Health Maintenance  Topic Date Due   COVID-19 Vaccine (7 - Pfizer risk 2024-25 season) 11/15/2023   Medicare Annual Wellness (AWV)  11/16/2023   INFLUENZA VACCINE  11/19/2023   MAMMOGRAM  02/01/2024   Colonoscopy  09/21/2025   DTaP/Tdap/Td (2 - Td or Tdap) 11/01/2028   Pneumococcal Vaccine: 50+ Years  Completed   DEXA SCAN  Completed   Hepatitis C Screening  Completed   Zoster Vaccines- Shingrix  Completed   Hepatitis B Vaccines  Aged Out  HPV VACCINES  Aged Out   Meningococcal B Vaccine  Aged Out    Health Maintenance  Health Maintenance Due  Topic Date Due   COVID-19 Vaccine (7 - Pfizer risk 2024-25 season) 11/15/2023   Medicare Annual Wellness (AWV)  11/16/2023   INFLUENZA VACCINE  11/19/2023   Health Maintenance Items Addressed: Discussed the need to update flu and covid vaccines annually.  Additional Screening:  Vision Screening: Recommended annual ophthalmology exams for early detection of glaucoma and other disorders of the eye. Up to date [facility] Would you like a referral to an eye doctor? No    Dental Screening: Recommended annual dental exams for proper oral hygiene  Community Resource Referral / Chronic Care Management: CRR required this visit?  No   CCM required this visit?  No   Plan:    I have personally reviewed and noted the following in the  patient's chart:   Medical and social history Use of alcohol, tobacco or illicit drugs  Current medications and supplements including opioid prescriptions. Patient is not currently taking opioid prescriptions. Functional ability and status Nutritional status Physical activity Advanced directives List of other physicians Hospitalizations, surgeries, and ER visits in previous 12 months Vitals Screenings to include cognitive, depression, and falls Referrals and appointments  In addition, I have reviewed and discussed with patient certain preventive protocols, quality metrics, and best practice recommendations. A written personalized care plan for preventive services as well as general preventive health recommendations were provided to patient.   Angeline Fredericks, LPN   04/27/7972   After Visit Summary: (MyChart) Due to this being a telephonic visit, the after visit summary with patients personalized plan was offered to patient via MyChart   Notes: Nothing significant to report at this time.

## 2023-12-08 ENCOUNTER — Other Ambulatory Visit: Payer: Self-pay | Admitting: Internal Medicine

## 2023-12-08 DIAGNOSIS — G4700 Insomnia, unspecified: Secondary | ICD-10-CM

## 2024-01-19 DIAGNOSIS — Z23 Encounter for immunization: Secondary | ICD-10-CM | POA: Diagnosis not present

## 2024-01-25 ENCOUNTER — Ambulatory Visit

## 2024-01-25 VITALS — BP 100/70 | HR 62 | Temp 98.4°F | Ht 64.0 in | Wt 136.2 lb

## 2024-01-25 DIAGNOSIS — I10 Essential (primary) hypertension: Secondary | ICD-10-CM

## 2024-01-25 DIAGNOSIS — Z1231 Encounter for screening mammogram for malignant neoplasm of breast: Secondary | ICD-10-CM

## 2024-01-25 DIAGNOSIS — G4700 Insomnia, unspecified: Secondary | ICD-10-CM

## 2024-01-25 DIAGNOSIS — Z79899 Other long term (current) drug therapy: Secondary | ICD-10-CM | POA: Diagnosis not present

## 2024-01-25 DIAGNOSIS — D509 Iron deficiency anemia, unspecified: Secondary | ICD-10-CM

## 2024-01-25 DIAGNOSIS — K219 Gastro-esophageal reflux disease without esophagitis: Secondary | ICD-10-CM

## 2024-01-25 DIAGNOSIS — M069 Rheumatoid arthritis, unspecified: Secondary | ICD-10-CM | POA: Diagnosis not present

## 2024-01-25 DIAGNOSIS — M0609 Rheumatoid arthritis without rheumatoid factor, multiple sites: Secondary | ICD-10-CM | POA: Diagnosis not present

## 2024-01-25 DIAGNOSIS — R7303 Prediabetes: Secondary | ICD-10-CM | POA: Diagnosis not present

## 2024-01-25 DIAGNOSIS — E785 Hyperlipidemia, unspecified: Secondary | ICD-10-CM

## 2024-01-25 DIAGNOSIS — M170 Bilateral primary osteoarthritis of knee: Secondary | ICD-10-CM | POA: Diagnosis not present

## 2024-01-25 DIAGNOSIS — E782 Mixed hyperlipidemia: Secondary | ICD-10-CM | POA: Diagnosis not present

## 2024-01-25 DIAGNOSIS — M7918 Myalgia, other site: Secondary | ICD-10-CM | POA: Diagnosis not present

## 2024-01-25 MED ORDER — ATORVASTATIN CALCIUM 80 MG PO TABS: 80.0000 mg | ORAL_TABLET | Freq: Every day | ORAL | 3 refills | Status: AC

## 2024-01-25 MED ORDER — HYDROCHLOROTHIAZIDE 25 MG PO TABS: 25.0000 mg | ORAL_TABLET | Freq: Every day | ORAL | 3 refills | Status: AC

## 2024-01-25 MED ORDER — TRAZODONE HCL 50 MG PO TABS: 100.0000 mg | ORAL_TABLET | Freq: Every day | ORAL | 1 refills | Status: AC

## 2024-01-25 MED ORDER — CARVEDILOL 6.25 MG PO TABS
6.2500 mg | ORAL_TABLET | Freq: Two times a day (BID) | ORAL | 1 refills | Status: AC
Start: 2024-01-25 — End: ?

## 2024-01-25 MED ORDER — PANTOPRAZOLE SODIUM 40 MG PO TBEC: 40.0000 mg | DELAYED_RELEASE_TABLET | Freq: Every day | ORAL | 1 refills | Status: DC

## 2024-01-25 NOTE — Progress Notes (Signed)
 Established Patient Office Visit TOC from Dr. Maribeth   Subjective  Patient ID: Audrey Hamilton, female    DOB: 08-Feb-1946  Age: 78 y.o. MRN: [id number]  Chief Complaint  Patient presents with   Establish Care    She  has a past medical history of Allergy, Anxiety, Arthritis, Breast implant rupture, Cervical radiculopathy, Chicken pox, Colon polyps, Depression, Fibromyalgia, GERD (gastroesophageal reflux disease), History of gastric surgery, History of hypothyroidism, Hyperlipidemia, Hypertension, Hypokalemia, Insomnia, Iron  deficiency anemia, Multiple gastric ulcers, Pre-diabetes, and Rheumatoid arthritis (HCC) (Since 1995).  HPI Patient presents for [facility] from previous PCP and chronic medication  check.  Seronegative RA Fibromyalgia  - Rheumatology Dr. Tobie at [facility] for seronegative RA  - On Plaquenil  200 mg twice daily (previously treated with methotrexate, caused GI s/e). Goes through annual eye exam.  -  OA of both knees (h/o right knee surgery in 2024, on Tylenol  prn), - myofacial pains (on Flexeril  5 mg at bed time as needed). He recommends f/u in 6 months, last visit on 01/25/24.  Insomnia: On trazodone  50 mg and melatonin. She still struggles to fall asleep. She goes to bed at same time, does not drink caffeine beverage in the afternoon or evening. She denies anxiety/racing thoughts.   Hypertension: Denies chest pain, palpitations, chest pain. Currently on Hydrochlorothiazide  25 mg daily as well as carvedilol  6.25 mg twice daily. She does not check BP at home.   GERD: On Protonix  40 mg daily, tolerating well. She has a h/o gastric ulcer, iron  deficiency anemia. Previously treated with Venofer . Has upcoming appointment with oncology in December.   Hyperlipidemia: On Lipitor 80 mg daily. No new concerns.   ROS As per HPI    Objective:     BP 100/70 (BP Location: Right Arm, Patient Position: Sitting, Cuff Size: Small)   Pulse 62   Temp 98.4 F (36.9 C)  (Oral)   Ht 5' 4 (1.626 m)   Wt 136 lb 3.2 oz (61.8 kg)   SpO2 95%   BMI 23.38 kg/m      01/25/2024    3:20 PM 11/19/2023   10:03 AM 08/23/2023    2:04 PM  Depression screen PHQ 2/9  Decreased Interest 0 0 0  Down, Depressed, Hopeless 0 0 0  PHQ - 2 Score 0 0 0  Altered sleeping 3 0 0  Tired, decreased energy 0 0 0  Change in appetite 0 0 0  Feeling bad or failure about yourself  0 0 0  Trouble concentrating 0 0 0  Moving slowly or fidgety/restless 0 0 0  Suicidal thoughts 0 0 0  PHQ-9 Score 3 0 0  Difficult doing work/chores Somewhat difficult Not difficult at all Not difficult at all      01/25/2024    3:20 PM 08/23/2023    2:05 PM 12/30/2022   11:29 AM 11/25/2022   11:47 AM  GAD 7 : Generalized Anxiety Score  Nervous, Anxious, on Edge 0 0 0 0  Control/stop worrying 0 0 0 0  Worry too much - different things 0 0 0 0  Trouble relaxing 0 0 0 0  Restless 0 0 0 0  Easily annoyed or irritable 0 0 0 0  Afraid - awful might happen 0 0 0 0  Total GAD 7 Score 0 0 0 0  Anxiety Difficulty Not difficult at all Not difficult at all Not difficult at all Not difficult at all      01/25/2024  3:20 PM 11/19/2023   10:03 AM 08/23/2023    2:04 PM  Depression screen PHQ 2/9  Decreased Interest 0 0 0  Down, Depressed, Hopeless 0 0 0  PHQ - 2 Score 0 0 0  Altered sleeping 3 0 0  Tired, decreased energy 0 0 0  Change in appetite 0 0 0  Feeling bad or failure about yourself  0 0 0  Trouble concentrating 0 0 0  Moving slowly or fidgety/restless 0 0 0  Suicidal thoughts 0 0 0  PHQ-9 Score 3 0 0  Difficult doing work/chores Somewhat difficult Not difficult at all Not difficult at all      01/25/2024    3:20 PM 08/23/2023    2:05 PM 12/30/2022   11:29 AM 11/25/2022   11:47 AM  GAD 7 : Generalized Anxiety Score  Nervous, Anxious, on Edge 0 0 0 0  Control/stop worrying 0 0 0 0  Worry too much - different things 0 0 0 0  Trouble relaxing 0 0 0 0  Restless 0 0 0 0  Easily annoyed or  irritable 0 0 0 0  Afraid - awful might happen 0 0 0 0  Total GAD 7 Score 0 0 0 0  Anxiety Difficulty Not difficult at all Not difficult at all Not difficult at all Not difficult at all   SDOH Screenings   Food Insecurity: No Food Insecurity (01/22/2024)  Housing: Low Risk  (01/22/2024)  Transportation Needs: No Transportation Needs (01/22/2024)  Utilities: Not At Risk (11/19/2023)  Alcohol Screen: Low Risk  (11/19/2023)  Depression (PHQ2-9): Low Risk  (01/25/2024)  Financial Resource Strain: Low Risk  (01/22/2024)  Physical Activity: Sufficiently Active (01/22/2024)  Recent Concern: Physical Activity - Insufficiently Active (11/19/2023)  Social Connections: Socially Isolated (01/22/2024)  Stress: No Stress Concern Present (01/22/2024)  Tobacco Use: Medium Risk (01/25/2024)  Health Literacy: Adequate Health Literacy (11/19/2023)     Physical Exam Constitutional:      General: She is not in acute distress. HENT:     Head: Normocephalic and atraumatic.     Right Ear: Tympanic membrane normal.     Left Ear: Tympanic membrane normal.     Mouth/Throat:     Mouth: Mucous membranes are moist.  Cardiovascular:     Rate and Rhythm: Normal rate.  Pulmonary:     Effort: Pulmonary effort is normal.     Breath sounds: Normal breath sounds. No wheezing.  Abdominal:     General: Bowel sounds are normal.     Palpations: Abdomen is soft.     Tenderness: There is no guarding.  Musculoskeletal:     Cervical back: Neck supple.     Right lower leg: No edema.     Left lower leg: No edema.  Lymphadenopathy:     Cervical: No cervical adenopathy.  Neurological:     Mental Status: She is alert and oriented to person, place, and time.  Psychiatric:        Mood and Affect: Mood normal.        No results found for any visits on 01/25/24.  The ASCVD Risk score (Arnett DK, et al., 2019) failed to calculate for the following reasons:   Unable to determine if patient is Non-Hispanic African American      Assessment & Plan:   Essential hypertension Assessment & Plan: BP well controlled today.  Continue Hydrochlorothiazide  25 mg daily and carvedilol  6.25 mg twice daily. Refill sent.   Orders: -  Carvedilol ; Take 1 tablet (6.25 mg total) by mouth 2 (two) times daily with a meal.  Dispense: 180 tablet; Refill: 1 -     hydroCHLOROthiazide ; Take 1 tablet (25 mg total) by mouth daily.  Dispense: 90 tablet; Refill: 3  Mixed hyperlipidemia Assessment & Plan: Chronic, tolerating Lipitor 80 mg daily, continue.   Orders: -     Atorvastatin  Calcium ; Take 1 tablet (80 mg total) by mouth daily for 180 doses.  Dispense: 90 tablet; Refill: 3  Gastroesophageal reflux disease, unspecified whether esophagitis present Assessment & Plan: Chronic with stable symptoms on Protonix  40 mg daily. Continue. B12 normal on 07/23/23.   Orders: -     Pantoprazole  Sodium; Take 1 tablet (40 mg total) by mouth daily.  Dispense: 180 tablet; Refill: 1  Insomnia, unspecified type Assessment & Plan: Chronic insomnia with difficulty initiating and maintaining sleep. Current trazodone  dose insufficient. Increase Trazodone  to 100 mg nightly. Provide information on sleep hygiene.Encourage gentle stretching and breathing exercises before bed. F/U in 3 months.   Orders: -     traZODone  HCl; Take 2 tablets (100 mg total) by mouth at bedtime.  Dispense: 180 tablet; Refill: 1  Encounter for screening mammogram for breast cancer Assessment & Plan: Will be due for screening mammogram 02/02/24, future order made, patient provided with phone number to schedule for an appointment   Orders: -     3D Screening Mammogram, Left and Right; Future  Rheumatoid arthritis involving multiple sites, unspecified whether rheumatoid factor present [facility]) Assessment & Plan: Seronegative rheumatoid arthritis with bilateral knee pain, status post right knee replacement. On Plaquenil  200 mg BID. Recommend continuing annual eye exam and f/u  with rheumatologist at [facility] Dr. Tobie     Prediabetes Assessment & Plan: Chronic, A1c stable. Repeat A1c in future appointment in 3 months.      Return in about 3 months (around 04/26/2024).   Luke Shade, MD

## 2024-01-25 NOTE — Assessment & Plan Note (Signed)
 BP well controlled today.  Continue Hydrochlorothiazide  25 mg daily and carvedilol  6.25 mg twice daily. Refill sent.

## 2024-01-25 NOTE — Assessment & Plan Note (Signed)
 Will be due for screening mammogram 02/02/24, future order made, patient provided with phone number to schedule for an appointment

## 2024-01-25 NOTE — Assessment & Plan Note (Signed)
 Chronic with stable symptoms on Protonix  40 mg daily. Continue. B12 normal on 07/23/23.

## 2024-01-25 NOTE — Assessment & Plan Note (Signed)
 Chronic, A1c stable. Repeat A1c in future appointment in 3 months.

## 2024-01-25 NOTE — Assessment & Plan Note (Signed)
 Seronegative rheumatoid arthritis with bilateral knee pain, status post right knee replacement. On Plaquenil  200 mg BID. Recommend continuing annual eye exam and f/u with rheumatologist at [facility] Dr. Tobie

## 2024-01-25 NOTE — Assessment & Plan Note (Signed)
 Chronic insomnia with difficulty initiating and maintaining sleep. Current trazodone  dose insufficient. Increase Trazodone  to 100 mg nightly. Provide information on sleep hygiene.Encourage gentle stretching and breathing exercises before bed. F/U in 3 months.

## 2024-01-25 NOTE — Patient Instructions (Addendum)
 For sleep:  - Increase the dose of Trazodone  to 100 mg at bedtime.   - Take Melatonin an hour before going to bed.   1. Keep a Regular Sleep Schedule Go to bed and wake up at the same time every day, even on weekends. Try not to stay in bed if you can't fall asleep--get up and do something quiet until you feel sleepy. 2. Make Your Bedroom Comfortable Keep your bedroom quiet, dark, and cool. Use a comfortable mattress and pillows. Only use your bed for sleeping (and intimacy), not for watching TV or using your phone. 3. Get Daylight and Stay Active Spend time outside in natural light during the day, especially in the morning. Try to be active most days, like taking a walk, but avoid heavy exercise within 3 hours of bedtime. 4. Be Careful with Naps If you need to nap, keep it short (20-30 minutes) and before 3 PM. 5. Watch What You Eat and Drink Avoid caffeine (coffee, tea, cola, chocolate) after 2 PM. Don't drink alcohol close to bedtime--it can make your sleep worse. Avoid smoking or using nicotine in the evening. Don't eat big meals late at night. If you're hungry, a light snack is okay. 6. Create a Relaxing Bedtime Routine Do something calming before bed, like reading, listening to soft music, or taking a warm bath. Try relaxation techniques like deep breathing or gentle stretching.    YOUR MAMMOGRAM IS DUE, PLEASE CALL AND GET THIS SCHEDULED, anytime after 02/02/24 [facility] - call [phone]

## 2024-01-25 NOTE — Assessment & Plan Note (Signed)
 Chronic, tolerating Lipitor 80 mg daily, continue.

## 2024-02-08 ENCOUNTER — Encounter

## 2024-03-07 DIAGNOSIS — M0560 Rheumatoid arthritis of unspecified site with involvement of other organs and systems: Secondary | ICD-10-CM | POA: Diagnosis not present

## 2024-03-07 DIAGNOSIS — Z79899 Other long term (current) drug therapy: Secondary | ICD-10-CM | POA: Diagnosis not present

## 2024-03-10 DIAGNOSIS — Z96651 Presence of right artificial knee joint: Secondary | ICD-10-CM | POA: Diagnosis not present

## 2024-03-24 ENCOUNTER — Inpatient Hospital Stay: Attending: Internal Medicine

## 2024-03-24 ENCOUNTER — Inpatient Hospital Stay: Admitting: Internal Medicine

## 2024-03-24 ENCOUNTER — Encounter: Payer: Self-pay | Admitting: Internal Medicine

## 2024-03-24 ENCOUNTER — Inpatient Hospital Stay

## 2024-03-24 VITALS — BP 101/52 | HR 57 | Temp 97.7°F | Resp 18

## 2024-03-24 VITALS — BP 92/62 | HR 61 | Temp 97.6°F | Resp 16 | Ht 64.0 in | Wt 134.5 lb

## 2024-03-24 DIAGNOSIS — D509 Iron deficiency anemia, unspecified: Secondary | ICD-10-CM

## 2024-03-24 DIAGNOSIS — K30 Functional dyspepsia: Secondary | ICD-10-CM | POA: Diagnosis not present

## 2024-03-24 DIAGNOSIS — Z886 Allergy status to analgesic agent status: Secondary | ICD-10-CM | POA: Insufficient documentation

## 2024-03-24 DIAGNOSIS — Z602 Problems related to living alone: Secondary | ICD-10-CM | POA: Insufficient documentation

## 2024-03-24 DIAGNOSIS — Z823 Family history of stroke: Secondary | ICD-10-CM | POA: Diagnosis not present

## 2024-03-24 DIAGNOSIS — Z83438 Family history of other disorder of lipoprotein metabolism and other lipidemia: Secondary | ICD-10-CM | POA: Diagnosis not present

## 2024-03-24 DIAGNOSIS — Z79899 Other long term (current) drug therapy: Secondary | ICD-10-CM | POA: Insufficient documentation

## 2024-03-24 DIAGNOSIS — Z883 Allergy status to other anti-infective agents status: Secondary | ICD-10-CM | POA: Diagnosis not present

## 2024-03-24 DIAGNOSIS — Z9013 Acquired absence of bilateral breasts and nipples: Secondary | ICD-10-CM | POA: Diagnosis not present

## 2024-03-24 DIAGNOSIS — M797 Fibromyalgia: Secondary | ICD-10-CM | POA: Insufficient documentation

## 2024-03-24 DIAGNOSIS — Z833 Family history of diabetes mellitus: Secondary | ICD-10-CM | POA: Diagnosis not present

## 2024-03-24 DIAGNOSIS — M069 Rheumatoid arthritis, unspecified: Secondary | ICD-10-CM | POA: Diagnosis not present

## 2024-03-24 DIAGNOSIS — Z8261 Family history of arthritis: Secondary | ICD-10-CM | POA: Insufficient documentation

## 2024-03-24 DIAGNOSIS — Z880 Allergy status to penicillin: Secondary | ICD-10-CM | POA: Insufficient documentation

## 2024-03-24 DIAGNOSIS — Z90710 Acquired absence of both cervix and uterus: Secondary | ICD-10-CM | POA: Insufficient documentation

## 2024-03-24 DIAGNOSIS — Z860100 Personal history of colon polyps, unspecified: Secondary | ICD-10-CM | POA: Diagnosis not present

## 2024-03-24 DIAGNOSIS — E785 Hyperlipidemia, unspecified: Secondary | ICD-10-CM | POA: Insufficient documentation

## 2024-03-24 DIAGNOSIS — Z818 Family history of other mental and behavioral disorders: Secondary | ICD-10-CM | POA: Diagnosis not present

## 2024-03-24 DIAGNOSIS — Z604 Social exclusion and rejection: Secondary | ICD-10-CM | POA: Diagnosis not present

## 2024-03-24 DIAGNOSIS — I10 Essential (primary) hypertension: Secondary | ICD-10-CM | POA: Diagnosis not present

## 2024-03-24 DIAGNOSIS — D649 Anemia, unspecified: Secondary | ICD-10-CM | POA: Insufficient documentation

## 2024-03-24 DIAGNOSIS — Z9889 Other specified postprocedural states: Secondary | ICD-10-CM

## 2024-03-24 DIAGNOSIS — Z809 Family history of malignant neoplasm, unspecified: Secondary | ICD-10-CM | POA: Diagnosis not present

## 2024-03-24 DIAGNOSIS — Z8711 Personal history of peptic ulcer disease: Secondary | ICD-10-CM | POA: Insufficient documentation

## 2024-03-24 DIAGNOSIS — R5383 Other fatigue: Secondary | ICD-10-CM | POA: Diagnosis not present

## 2024-03-24 DIAGNOSIS — Z87891 Personal history of nicotine dependence: Secondary | ICD-10-CM | POA: Diagnosis not present

## 2024-03-24 LAB — CBC WITH DIFFERENTIAL/PLATELET
Abs Immature Granulocytes: 0.04 K/uL (ref 0.00–0.07)
Basophils Absolute: 0.1 K/uL (ref 0.0–0.1)
Basophils Relative: 1 %
Eosinophils Absolute: 0.8 K/uL — ABNORMAL HIGH (ref 0.0–0.5)
Eosinophils Relative: 7 %
HCT: 32.9 % — ABNORMAL LOW (ref 36.0–46.0)
Hemoglobin: 11.1 g/dL — ABNORMAL LOW (ref 12.0–15.0)
Immature Granulocytes: 0 %
Lymphocytes Relative: 16 %
Lymphs Abs: 1.8 K/uL (ref 0.7–4.0)
MCH: 29.6 pg (ref 26.0–34.0)
MCHC: 33.7 g/dL (ref 30.0–36.0)
MCV: 87.7 fL (ref 80.0–100.0)
Monocytes Absolute: 0.7 K/uL (ref 0.1–1.0)
Monocytes Relative: 6 %
Neutro Abs: 8 K/uL — ABNORMAL HIGH (ref 1.7–7.7)
Neutrophils Relative %: 70 %
Platelets: 292 K/uL (ref 150–400)
RBC: 3.75 MIL/uL — ABNORMAL LOW (ref 3.87–5.11)
RDW: 13.1 % (ref 11.5–15.5)
WBC: 11.4 K/uL — ABNORMAL HIGH (ref 4.0–10.5)
nRBC: 0 % (ref 0.0–0.2)

## 2024-03-24 LAB — IRON AND TIBC
Iron: 31 ug/dL (ref 28–170)
Saturation Ratios: 8 % — ABNORMAL LOW (ref 10.4–31.8)
TIBC: 370 ug/dL (ref 250–450)
UIBC: 338 ug/dL

## 2024-03-24 LAB — VITAMIN B12: Vitamin B-12: 217 pg/mL (ref 180–914)

## 2024-03-24 LAB — FERRITIN: Ferritin: 16 ng/mL (ref 11–307)

## 2024-03-24 MED ORDER — IRON SUCROSE 20 MG/ML IV SOLN
200.0000 mg | Freq: Once | INTRAVENOUS | Status: AC
  Administered 2024-03-24: 200 mg via INTRAVENOUS
  Filled 2024-03-24: qty 10

## 2024-03-24 NOTE — Patient Instructions (Signed)

## 2024-03-24 NOTE — Progress Notes (Signed)
 [facility] CONSULT NOTE  Patient Care Team: Bair, Kalpana, MD as PCP - General (Family Medicine) Lemon Whitacre R, MD as Consulting Physician (Oncology) Tobie Lady POUR, MD as Consulting Physician (Rheumatology) Lorelle Hussar, MD as Consulting Physician (Orthopedic Surgery)  CHIEF COMPLAINTS/PURPOSE OF CONSULTATION: anemia/iron  deficiency  Oncology History   No history exists.     HISTORY OF PRESENTING ILLNESS:  Audrey Hamilton 78 y.o.  female with anemia- Hx of gastric surgery is here for a follow up.   Discussed the use of AI scribe software for clinical note transcription with the patient, who gave verbal consent to proceed.  History of Present Illness   Audrey Hamilton is a 78 year old female with anemia who presents for follow-up.  She experiences occasional fatigue but otherwise feels well. She has not been taking oral iron  supplements due to significant gastrointestinal upset, describing them as 'coming back as fast as she goes down.' Her last iron  infusion was over a year ago, prior to her knee replacement surgery.  Recent blood work shows her hemoglobin level is slightly low at 11. She takes B12 supplements on the advice of her son-in-law, who is a family physician.  She has a supportive family network, including her daughter who teaches nursing and her son-in-law who practices medicine. She lives alone in [place], having moved there after her husband's passing in 2016, but has family nearby.  She mentions having had a cancerous mole removed by a dermatologist at [facility]. No blood in stools.       Review of Systems  Constitutional:  Positive for malaise/fatigue. Negative for chills, diaphoresis, fever and weight loss.  HENT:  Negative for nosebleeds and sore throat.   Eyes:  Negative for double vision.  Respiratory:  Negative for cough, hemoptysis, sputum production, shortness of breath and wheezing.   Cardiovascular:  Negative for  chest pain, palpitations, orthopnea and leg swelling.  Gastrointestinal:  Negative for abdominal pain, blood in stool, constipation, diarrhea, heartburn, melena, nausea and vomiting.  Genitourinary:  Negative for dysuria, frequency and urgency.  Musculoskeletal:  Negative for back pain and joint pain.  Skin: Negative.  Negative for itching and rash.  Neurological:  Negative for dizziness, tingling, focal weakness, weakness and headaches.  Endo/Heme/Allergies:  Does not bruise/bleed easily.  Psychiatric/Behavioral:  Negative for depression. The patient is not nervous/anxious and does not have insomnia.     MEDICAL HISTORY:  Past Medical History:  Diagnosis Date   Allergy    Anxiety    controlled   Arthritis    Breast implant rupture    Cervical radiculopathy    Chicken pox    Colon polyps    Depression    Fibromyalgia    GERD (gastroesophageal reflux disease)    History of gastric surgery    History of hypothyroidism    Hyperlipidemia    Hypertension    Hypokalemia    Insomnia    Iron  deficiency anemia    Multiple gastric ulcers    Pre-diabetes    Rheumatoid arthritis (HCC) Since 1995   currently in a flare up    SURGICAL HISTORY: Past Surgical History:  Procedure Laterality Date   ABDOMINAL HYSTERECTOMY  1974   AUGMENTATION MAMMAPLASTY Bilateral 1970's   silicone   COLONOSCOPY WITH PROPOFOL  N/A 07/04/2018   Procedure: COLONOSCOPY WITH PROPOFOL ;  Surgeon: Viktoria Lamar DASEN, MD;  Location: [facility] ENDOSCOPY;  Service: Endoscopy;  Laterality: N/A;   COLONOSCOPY WITH PROPOFOL  N/A 09/22/2022   Procedure: COLONOSCOPY  WITH PROPOFOL ;  Surgeon: Maryruth Ole DASEN, MD;  Location: [facility] ENDOSCOPY;  Service: Endoscopy;  Laterality: N/A;   ESOPHAGOGASTRODUODENOSCOPY (EGD) WITH PROPOFOL  N/A 09/22/2022   Procedure: ESOPHAGOGASTRODUODENOSCOPY (EGD) WITH PROPOFOL ;  Surgeon: Maryruth Ole DASEN, MD;  Location: [facility] ENDOSCOPY;  Service: Endoscopy;  Laterality: N/A;   EYE SURGERY     FOOT  SURGERY Left 11/22/2010   FRACTURE SURGERY  20O16   [place]   MASTECTOMY Bilateral 1970's   SUBCUTANEOUS MASTECTOMY - NO CANCER   SMALL INTESTINE SURGERY  12/2001   STOMACH SURGERY  12/2001   TONSILLECTOMY AND ADENOIDECTOMY  1966   TOTAL KNEE ARTHROPLASTY Right 03/11/2023   Procedure: TOTAL KNEE ARTHROPLASTY;  Surgeon: Lorelle Hussar, MD;  Location: [facility] ORS;  Service: Orthopedics;  Laterality: Right;   UPPER GASTROINTESTINAL ENDOSCOPY      SOCIAL HISTORY: Social History   Socioeconomic History   Marital status: Widowed    Spouse name: Not on file   Number of children: Not on file   Years of education: Not on file   Highest education level: 9th grade  Occupational History   Not on file  Tobacco Use   Smoking status: Former    Current packs/day: 0.00    Types: Cigarettes    Quit date: 06/19/1995    Years since quitting: 28.7   Smokeless tobacco: Never  Vaping Use   Vaping status: Never Used  Substance and Sexual Activity   Alcohol use: Not Currently   Drug use: No   Sexual activity: Not on file  Other Topics Concern   Not on file  Social History Narrative   Not on file   Social Drivers of Health   Financial Resource Strain: Low Risk  (03/10/2024)   Received from [facility]   Overall Financial Resource Strain (CARDIA)    Difficulty of Paying Living Expenses: Not hard at all  Food Insecurity: No Food Insecurity (03/10/2024)   Received from [facility]   Hunger Vital Sign    Within the past 12 months, you worried that your food would run out before you got the money to buy more.: Never true    Within the past 12 months, the food you bought just didn't last and you didn't have money to get more.: Never true  Transportation Needs: No Transportation Needs (03/10/2024)   Received from [facility] - Transportation    In the past 12 months, has lack of transportation kept you from medical  appointments or from getting medications?: No    Lack of Transportation (Non-Medical): No  Physical Activity: Sufficiently Active (01/22/2024)   Exercise Vital Sign    Days of Exercise per Week: 5 days    Minutes of Exercise per Session: 40 min  Recent Concern: Physical Activity - Insufficiently Active (11/19/2023)   Exercise Vital Sign    Days of Exercise per Week: 3 days    Minutes of Exercise per Session: 30 min  Stress: No Stress Concern Present (01/22/2024)   [employer]ational [employer] - Occupational Stress Questionnaire    Feeling of Stress: Not at all  Social Connections: Socially Isolated (01/22/2024)   Social Connection and Isolation Panel    Frequency of Communication with Friends and Family: More than three times a week    Frequency of Social Gatherings with Friends and Family: Once a week    Attends Religious Services: Patient declined    Database Administrator or Organizations: No  Attends Banker Meetings: Not on file    Marital Status: Widowed  Intimate Partner Violence: Not At Risk (11/19/2023)   Humiliation, Afraid, Rape, and Kick questionnaire    Fear of Current or Ex-Partner: No    Emotionally Abused: No    Physically Abused: No    Sexually Abused: No    FAMILY HISTORY: Family History  Problem Relation Age of Onset   Arthritis Mother    Sudden death Mother        after birth of pt who was 24 days old.   Depression Mother    Arthritis Maternal Grandmother    Cancer Maternal Grandmother    Hyperlipidemia Maternal Grandfather    Diabetes Mellitus II Father    Liver disease Father    Rheum arthritis Son    Stroke Son    Cancer Paternal Grandmother    Breast cancer Neg Hx     ALLERGIES:  is allergic to amoxicillin-pot clavulanate, aspirin, baclofen , fluconazole, and oxaprozin.  MEDICATIONS:  Current Outpatient Medications  Medication Sig Dispense Refill   acetaminophen  (TYLENOL ) 500 MG tablet Take 2 tablets (1,000 mg total) by  mouth every 8 (eight) hours. 30 tablet 0   atorvastatin  (LIPITOR) 80 MG tablet Take 1 tablet (80 mg total) by mouth daily for 180 doses. 90 tablet 3   carvedilol  (COREG ) 6.25 MG tablet Take 1 tablet (6.25 mg total) by mouth 2 (two) times daily with a meal. 180 tablet 1   escitalopram  (LEXAPRO ) 10 MG tablet Take 10 mg by mouth daily.     hydrochlorothiazide  (HYDRODIURIL ) 25 MG tablet Take 1 tablet (25 mg total) by mouth daily. 90 tablet 3   hydroxychloroquine  (PLAQUENIL ) 200 MG tablet Take 200 mg by mouth 2 (two) times daily.     loratadine  (CLARITIN ) 10 MG tablet Take 10 mg by mouth daily.     Melatonin 10 MG TABS Take 1 tablet by mouth at bedtime.     montelukast  (SINGULAIR ) 10 MG tablet Take 10 mg by mouth at bedtime.     pantoprazole  (PROTONIX ) 40 MG tablet Take 1 tablet (40 mg total) by mouth daily. 180 tablet 1   traZODone  (DESYREL ) 50 MG tablet Take 2 tablets (100 mg total) by mouth at bedtime. 180 tablet 1   No current facility-administered medications for this visit.    PHYSICAL EXAMINATION:  Vitals:   03/24/24 1318  BP: 92/62  Pulse: 61  Resp: 16  Temp: 97.6 F (36.4 C)  SpO2: 100%   Filed Weights   03/24/24 1318  Weight: 134 lb 8 oz (61 kg)    Physical Exam Vitals and nursing note reviewed.  HENT:     Head: Normocephalic and atraumatic.     Mouth/Throat:     Pharynx: Oropharynx is clear.  Eyes:     Extraocular Movements: Extraocular movements intact.     Pupils: Pupils are equal, round, and reactive to light.  Cardiovascular:     Rate and Rhythm: Normal rate and regular rhythm.  Pulmonary:     Comments: Decreased breath sounds bilaterally.  Abdominal:     Palpations: Abdomen is soft.  Musculoskeletal:        General: Normal range of motion.     Cervical back: Normal range of motion.  Skin:    General: Skin is warm.  Neurological:     General: No focal deficit present.     Mental Status: She is alert and oriented to person, place, and time.  Psychiatric:  Behavior: Behavior normal.        Judgment: Judgment normal.     LABORATORY DATA:  I have reviewed the data as listed Lab Results  Component Value Date   WBC 11.4 (H) 03/24/2024   HGB 11.1 (L) 03/24/2024   HCT 32.9 (L) 03/24/2024   MCV 87.7 03/24/2024   PLT 292 03/24/2024   Recent Labs    08/23/23 1438  NA 143  K 4.1  CL 109  CO2 24  GLUCOSE 96  BUN 17  CREATININE 0.65  CALCIUM  9.6    RADIOGRAPHIC STUDIES: I have personally reviewed the radiological images as listed and agreed with the findings in the report. No results found.   Symptomatic anemia  Iron  deficiency anemia: -Not responding to oral iron .  Also difficulty tolerating oral iron  due to GI upset.  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   -Endoscopy with Dr. Maryruth in June 2024 showed mild gastritis.  Colonoscopy showed 3 mm polyp at the ileocecal valve which was benign and internal hemorrhoids -Completed IV Venofer  200 mg weekly x 5 doses in March 2024. -Hemoglobin 11 Iron  level is pending.   Proceed with venofer -    #H/o gastric surgery # Vitamin B12 monitoring -Vitamin B12 level pending from today.  Resume B12 supplements.   # Rheumatoid arthritis # Fibromyalgia -Follows with Dr. Tobie.  On Plaquenil .  Currently on prednisone .  # DISPOSITION: # venofer   # follow up in 6 months- MD ; labs- cbc/cmp; iron  studies;ferritin; B12 levels- possible venofer - Dr.B     Above plan of care was discussed with patient/family in detail.  My contact information was given to the patient/family.       Cindy JONELLE Joe, MD 03/24/2024 2:02 PM

## 2024-03-24 NOTE — Progress Notes (Signed)
 Fatigue/weakness: YES Dyspena: NO  Light headedness: YES Blood in stool: NO

## 2024-03-24 NOTE — Assessment & Plan Note (Addendum)
 Iron  deficiency anemia: -Not responding to oral iron .  Also difficulty tolerating oral iron  due to GI upset.  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   -Endoscopy with Dr. Maryruth in June 2024 showed mild gastritis.  Colonoscopy showed 3 mm polyp at the ileocecal valve which was benign and internal hemorrhoids -Completed IV Venofer  200 mg weekly x 5 doses in March 2024. -Hemoglobin 11 Iron  level is pending.   Proceed with venofer -    #H/o gastric surgery # Vitamin B12 monitoring -Vitamin B12 level pending from today.  Resume B12 supplements.   # Rheumatoid arthritis # Fibromyalgia -Follows with Dr. Tobie.  On Plaquenil .  Currently on prednisone .  # DISPOSITION: # venofer   # follow up in 6 months- MD ; labs- cbc/cmp; iron  studies;ferritin; B12 levels- possible venofer - Dr.B

## 2024-04-14 ENCOUNTER — Ambulatory Visit
Admission: RE | Admit: 2024-04-14 | Discharge: 2024-04-14 | Disposition: A | Discharge status: Home / Self Care | Source: Ambulatory Visit

## 2024-04-14 DIAGNOSIS — Z1231 Encounter for screening mammogram for malignant neoplasm of breast: Secondary | ICD-10-CM | POA: Diagnosis present

## 2024-04-26 ENCOUNTER — Ambulatory Visit (INDEPENDENT_AMBULATORY_CARE_PROVIDER_SITE_OTHER)

## 2024-04-26 VITALS — BP 126/88 | HR 98 | Temp 97.8°F | Ht 64.0 in | Wt 133.6 lb

## 2024-04-26 DIAGNOSIS — F5101 Primary insomnia: Secondary | ICD-10-CM

## 2024-04-26 DIAGNOSIS — M069 Rheumatoid arthritis, unspecified: Secondary | ICD-10-CM | POA: Diagnosis not present

## 2024-04-26 DIAGNOSIS — E782 Mixed hyperlipidemia: Secondary | ICD-10-CM

## 2024-04-26 DIAGNOSIS — D649 Anemia, unspecified: Secondary | ICD-10-CM | POA: Diagnosis not present

## 2024-04-26 DIAGNOSIS — J300 Vasomotor rhinitis: Secondary | ICD-10-CM

## 2024-04-26 DIAGNOSIS — I10 Essential (primary) hypertension: Secondary | ICD-10-CM

## 2024-04-26 DIAGNOSIS — R7303 Prediabetes: Secondary | ICD-10-CM | POA: Diagnosis not present

## 2024-04-26 MED ORDER — IPRATROPIUM BROMIDE 0.03 % NA SOLN: 2.0000 | Freq: Three times a day (TID) | NASAL | 2 refills | Status: AC | PRN

## 2024-04-26 NOTE — Assessment & Plan Note (Addendum)
 Taking Atorvastatin  80 mg daily without concerns, continue. Repeat lipid panel in 6 months.  Orders:   Lipid panel; Future

## 2024-04-26 NOTE — Assessment & Plan Note (Addendum)
 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 Prescribed nasal spray: ipratropium 0.03% two puffs each nostril three times daily as needed before meals. - Advised to report if symptoms persist for potential ENT referral. Orders:   ipratropium (ATROVENT ) 0.03 % nasal spray; Place 2 sprays into both nostrils 3 (three) times daily as needed for rhinitis.

## 2024-04-26 NOTE — Assessment & Plan Note (Addendum)
 Chronic. Seronegative rheumatoid arthritis.  Intermittent pain of b/l knees, hands, shoulder.  On Plaquenil  200 mg BID, prn Tylenol . Has been prescribed cyclobenzaprine  5 mg prn that patient does not find beneficial. Recommend continuing annual eye exam and f/u with rheumatologist Dr. Tobie at [facility] for management.

## 2024-04-26 NOTE — Assessment & Plan Note (Addendum)
 A1c previously in prediabetic range.  Repeat A1c in 6 months, less concerned about progression to diabetes. Orders:   HgB A1c; Future

## 2024-04-26 NOTE — Assessment & Plan Note (Addendum)
 Patient is seeing haematology and is getting iron  infusion. She feels much better compared to her last visit with me in 01/25/24.  Continue follow up with heme-onc for anemia management. She is also taking vitamin b12 1000 mcg, continue.

## 2024-04-26 NOTE — Assessment & Plan Note (Addendum)
 Since increasing Trazodone  from 50 mg to 100 mg during her last visit on 01/25/24 sleep quality has improved. Continue current treatment with prn Melatonin 10 mg at bedtime.

## 2024-04-26 NOTE — Patient Instructions (Signed)
 Trial of nose spray 2 puffs in each nostril three times a day as needed before meals to see if this helps with runny nose. Please let me know if it does not.

## 2024-04-26 NOTE — Assessment & Plan Note (Addendum)
 BP within goal, continue Hydrochlorothiazide  25 mg daily and carvedilol  6.25 mg twice daily.  Check CMP in 6 months.  Orders:   Comp Met (CMET); Future

## 2024-04-26 NOTE — Progress Notes (Signed)
 "  Established Patient Office Visit   Subjective  Patient ID: Audrey Hamilton, female    DOB: 1946/03/16  Age: 79 y.o. MRN: [id number]  Chief Complaint  Patient presents with   Hypertension   Gastroesophageal Reflux   Hyperlipidemia   Insomnia        Shoulder Pain    Discussed the use of AI scribe software for clinical note transcription with the patient, who gave verbal consent to proceed.  History of Present Illness Audrey Hamilton is a 79 year old female with seronegative rheumatoid arthritis and iron  deficiency anemia who presents with a runny nose on the right side.   She experiences a runny nose primarily on the right side, which worsens with outdoor exposure or eating. Symptoms typically begin around 10 AM and persist intermittently throughout the day, with the left side remaining unaffected.  She has a long-standing history of rheumatoid arthritis, experiencing muscle pain, particularly in the right arm, which she attributes to past work-related activities and lifting her late husband. She uses a heating pad and a massage chair for relief and takes Flexeril  for muscle tightness, though she finds it ineffective.  She has a history of anemia, requiring iron  infusions due to intolerance to oral iron  supplements. She recently had an iron  infusion and plans to return for another in June unless her condition worsens.She also takes B12 vitamins, likely 1000 mcg, due to previously low normal B12 levels.  She has hypertension for which she takes 25 mg hydrochlorothiazide , 6.25 mg Coreg  twice daily.   She reports improved sleep recently, although she occasionally experiences sleepless nights and takes Melatonin 10 mg prn at bedtime with Trazodone  100 mg nightly.       ROS As per HPI    Objective:     BP 126/88 (BP Location: Left Arm, Patient Position: Sitting)   Pulse 98   Temp 97.8 F (36.6 C) (Oral)   Ht 5' 4 (1.626 m)   Wt 133 lb 9.6 oz (60.6 kg)   SpO2 98%    BMI 22.93 kg/m      03/24/2024    2:08 PM 03/24/2024    1:40 PM 01/25/2024    3:20 PM  Depression screen PHQ 2/9  Decreased Interest 0 0 0  Down, Depressed, Hopeless 0 0 0  PHQ - 2 Score 0 0 0  Altered sleeping   3  Tired, decreased energy   0  Change in appetite   0  Feeling bad or failure about yourself    0  Trouble concentrating   0  Moving slowly or fidgety/restless   0  Suicidal thoughts   0  PHQ-9 Score   3   Difficult doing work/chores   Somewhat difficult     Data saved with a previous flowsheet row definition      01/25/2024    3:20 PM 08/23/2023    2:05 PM 12/30/2022   11:29 AM 11/25/2022   11:47 AM  GAD 7 : Generalized Anxiety Score  Nervous, Anxious, on Edge 0 0 0 0  Control/stop worrying 0 0 0 0  Worry too much - different things 0 0 0 0  Trouble relaxing 0 0 0 0  Restless 0 0 0 0  Easily annoyed or irritable 0 0 0 0  Afraid - awful might happen 0 0 0 0  Total GAD 7 Score 0 0 0 0  Anxiety Difficulty Not difficult at all Not difficult at all Not difficult at all Not  difficult at all      03/24/2024    2:08 PM 03/24/2024    1:40 PM 01/25/2024    3:20 PM  Depression screen PHQ 2/9  Decreased Interest 0 0 0  Down, Depressed, Hopeless 0 0 0  PHQ - 2 Score 0 0 0  Altered sleeping   3  Tired, decreased energy   0  Change in appetite   0  Feeling bad or failure about yourself    0  Trouble concentrating   0  Moving slowly or fidgety/restless   0  Suicidal thoughts   0  PHQ-9 Score   3   Difficult doing work/chores   Somewhat difficult     Data saved with a previous flowsheet row definition      01/25/2024    3:20 PM 08/23/2023    2:05 PM 12/30/2022   11:29 AM 11/25/2022   11:47 AM  GAD 7 : Generalized Anxiety Score  Nervous, Anxious, on Edge 0 0 0 0  Control/stop worrying 0 0 0 0  Worry too much - different things 0 0 0 0  Trouble relaxing 0 0 0 0  Restless 0 0 0 0  Easily annoyed or irritable 0 0 0 0  Afraid - awful might happen 0 0 0 0  Total GAD 7  Score 0 0 0 0  Anxiety Difficulty Not difficult at all Not difficult at all Not difficult at all Not difficult at all   SDOH Screenings   Food Insecurity: No Food Insecurity (04/24/2024)  Housing: Low Risk (04/24/2024)  Transportation Needs: No Transportation Needs (04/24/2024)  Utilities: Not At Risk (03/10/2024)   Received from [facility]  Alcohol Screen: Low Risk (11/19/2023)  Depression (PHQ2-9): Low Risk (03/24/2024)  Financial Resource Strain: Low Risk (04/24/2024)  Physical Activity: Sufficiently Active (04/24/2024)  Social Connections: Socially Isolated (04/24/2024)  Stress: No Stress Concern Present (04/24/2024)  Tobacco Use: Medium Risk (04/26/2024)  Health Literacy: Adequate Health Literacy (11/19/2023)     Physical Exam Constitutional:      General: She is not in acute distress. HENT:     Head: Normocephalic and atraumatic.     Nose: Rhinorrhea (clear b/l right slightly worse than left) present.  Cardiovascular:     Rate and Rhythm: Normal rate.     Pulses: Normal pulses.  Abdominal:     Tenderness: There is no abdominal tenderness. There is no guarding.  Musculoskeletal:     Cervical back: Normal range of motion. No rigidity.     Right lower leg: No edema.     Left lower leg: No edema.  Skin:    General: Skin is warm.  Neurological:     Mental Status: She is alert and oriented to person, place, and time.  Psychiatric:        Mood and Affect: Mood normal.        No results found for any visits on 04/26/24.  The ASCVD Risk score (Arnett DK, et al., 2019) failed to calculate for the following reasons:   Unable to determine if patient is Non-Hispanic African American     Assessment & Plan:  Patient is a pleasant 79 year old female presenting for a follow up visit.  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 Prescribed nasal spray:  ipratropium 0.03% two puffs each nostril three times daily as needed before meals. - Advised to report if  symptoms persist for potential ENT referral. Orders:   ipratropium (ATROVENT ) 0.03 % nasal spray; Place 2 sprays into both nostrils 3 (three) times daily as needed for rhinitis.  Rheumatoid arthritis involving multiple sites, unspecified whether rheumatoid factor present (HCC) Chronic. Seronegative rheumatoid arthritis.  Intermittent pain of b/l knees, hands, shoulder.  On Plaquenil  200 mg BID, prn Tylenol . Has been prescribed cyclobenzaprine  5 mg prn that patient does not find beneficial. Recommend continuing annual eye exam and f/u with [facility]ologist [facility]ie [facility] for management.     Prediabetes A1c previously in prediabetic range.  Repeat A1c in 6 months, less concerned about progression to diabetes. Orders:   HgB A1c; Future  Essential hypertension BP within goal, continue Hydrochlorothiazide  25 mg daily and carvedilol  6.25 mg twice daily.  Check CMP in 6 months.  Orders:   Comp Met (CMET); Future  Mixed hyperlipidemia Taking Atorvastatin  80 mg daily without concerns, continue. Repeat lipid panel in 6 months.  Orders:   Lipid panel; Future  Symptomatic anemia Patient is seeing haematology and is getting iron  infusion. She feels much better compared to her last visit with me in 01/25/24.  Continue follow up with heme-onc for anemia management. She is also taking vitamin b12 1000 mcg, continue.     Primary insomnia Since increasing Trazodone  from 50 mg to 100 mg during her last visit on 01/25/24 sleep quality has improved. Continue current treatment with prn Melatonin 10 mg at bedtime.       Return in about 6 months (around 10/24/2024) for chronic follow up, labs 2 days before appointment.   Luke Shade, MD "

## 2024-05-02 ENCOUNTER — Other Ambulatory Visit: Payer: Self-pay | Admitting: Internal Medicine

## 2024-05-02 DIAGNOSIS — K219 Gastro-esophageal reflux disease without esophagitis: Secondary | ICD-10-CM

## 2024-05-02 NOTE — Telephone Encounter (Signed)
 Not [facility] patient

## 2024-09-22 ENCOUNTER — Inpatient Hospital Stay

## 2024-09-22 ENCOUNTER — Inpatient Hospital Stay: Admitting: Internal Medicine

## 2024-10-30 ENCOUNTER — Other Ambulatory Visit

## 2024-11-01 ENCOUNTER — Ambulatory Visit

## 2024-11-21 ENCOUNTER — Ambulatory Visit
